# Patient Record
Sex: Female | Born: 1945 | Race: White | Hispanic: No | Smoking: Never smoker
Health system: Southern US, Community
[De-identification: ages and names within clinical notes are randomized; demographics above are authoritative.]

## PROBLEM LIST (undated history)

## (undated) DIAGNOSIS — Z9289 Personal history of other medical treatment: Secondary | ICD-10-CM

## (undated) DIAGNOSIS — F329 Major depressive disorder, single episode, unspecified: Secondary | ICD-10-CM

## (undated) DIAGNOSIS — F32A Depression, unspecified: Secondary | ICD-10-CM

## (undated) DIAGNOSIS — E119 Type 2 diabetes mellitus without complications: Secondary | ICD-10-CM

## (undated) DIAGNOSIS — G610 Guillain-Barre syndrome: Secondary | ICD-10-CM

## (undated) DIAGNOSIS — I509 Heart failure, unspecified: Secondary | ICD-10-CM

## (undated) DIAGNOSIS — M797 Fibromyalgia: Secondary | ICD-10-CM

## (undated) DIAGNOSIS — J449 Chronic obstructive pulmonary disease, unspecified: Secondary | ICD-10-CM

## (undated) DIAGNOSIS — M545 Low back pain, unspecified: Secondary | ICD-10-CM

## (undated) DIAGNOSIS — G43909 Migraine, unspecified, not intractable, without status migrainosus: Secondary | ICD-10-CM

## (undated) DIAGNOSIS — I639 Cerebral infarction, unspecified: Secondary | ICD-10-CM

## (undated) DIAGNOSIS — G4730 Sleep apnea, unspecified: Secondary | ICD-10-CM

## (undated) DIAGNOSIS — K219 Gastro-esophageal reflux disease without esophagitis: Secondary | ICD-10-CM

## (undated) DIAGNOSIS — F0390 Unspecified dementia without behavioral disturbance: Secondary | ICD-10-CM

## (undated) DIAGNOSIS — G8929 Other chronic pain: Secondary | ICD-10-CM

## (undated) DIAGNOSIS — J45909 Unspecified asthma, uncomplicated: Secondary | ICD-10-CM

## (undated) DIAGNOSIS — K259 Gastric ulcer, unspecified as acute or chronic, without hemorrhage or perforation: Secondary | ICD-10-CM

## (undated) DIAGNOSIS — Z8719 Personal history of other diseases of the digestive system: Secondary | ICD-10-CM

## (undated) DIAGNOSIS — I10 Essential (primary) hypertension: Secondary | ICD-10-CM

## (undated) DIAGNOSIS — M1990 Unspecified osteoarthritis, unspecified site: Secondary | ICD-10-CM

## (undated) DIAGNOSIS — E7800 Pure hypercholesterolemia, unspecified: Secondary | ICD-10-CM

## (undated) DIAGNOSIS — Z8711 Personal history of peptic ulcer disease: Secondary | ICD-10-CM

## (undated) DIAGNOSIS — F419 Anxiety disorder, unspecified: Secondary | ICD-10-CM

## (undated) DIAGNOSIS — D649 Anemia, unspecified: Secondary | ICD-10-CM

## (undated) DIAGNOSIS — J189 Pneumonia, unspecified organism: Secondary | ICD-10-CM

## (undated) DIAGNOSIS — K920 Hematemesis: Secondary | ICD-10-CM

## (undated) HISTORY — PX: APPENDECTOMY: SHX54

## (undated) HISTORY — PX: BREAST SURGERY: SHX581

## (undated) HISTORY — PX: CATARACT EXTRACTION, BILATERAL: SHX1313

## (undated) HISTORY — PX: CARPAL TUNNEL RELEASE: SHX101

## (undated) HISTORY — PX: CHOLECYSTECTOMY: SHX55

## (undated) HISTORY — PX: ABDOMINAL HERNIA REPAIR: SHX539

## (undated) HISTORY — PX: KNEE ARTHROSCOPY: SHX127

## (undated) HISTORY — PX: DILATION AND CURETTAGE OF UTERUS: SHX78

## (undated) HISTORY — PX: BUNIONECTOMY: SHX129

## (undated) HISTORY — PX: HIP FRACTURE SURGERY: SHX118

## (undated) HISTORY — PX: FRACTURE SURGERY: SHX138

## (undated) HISTORY — PX: TONSILLECTOMY: SUR1361

## (undated) HISTORY — PX: HERNIA REPAIR: SHX51

## (undated) HISTORY — PX: ELBOW FRACTURE SURGERY: SHX616

## (undated) HISTORY — PX: ABDOMINAL HYSTERECTOMY: SHX81

## (undated) HISTORY — PX: SHOULDER OPEN ROTATOR CUFF REPAIR: SHX2407

---

## 2000-03-24 ENCOUNTER — Inpatient Hospital Stay (HOSPITAL_COMMUNITY): Admission: EM | Admit: 2000-03-24 | Discharge: 2000-04-01 | Payer: Self-pay | Admitting: *Deleted

## 2007-11-11 ENCOUNTER — Encounter: Admission: RE | Admit: 2007-11-11 | Discharge: 2007-11-11 | Payer: Self-pay | Admitting: Orthopedic Surgery

## 2008-01-05 ENCOUNTER — Inpatient Hospital Stay (HOSPITAL_COMMUNITY): Admission: RE | Admit: 2008-01-05 | Discharge: 2008-01-11 | Payer: Self-pay | Admitting: Orthopedic Surgery

## 2010-05-27 ENCOUNTER — Encounter: Payer: Self-pay | Admitting: Orthopedic Surgery

## 2010-09-17 NOTE — Discharge Summary (Signed)
Melissa [name], Melissa [name]              ACCOUNT NO.:  [id number]   MEDICAL RECORD NO.:  [id number]          PATIENT TYPE:  INP   LOCATION:  NA                           FACILITY:  [facility]   PHYSICIAN:  Alexzandrew L. Perkins, P.A.C.DATE OF BIRTH:  06/10/1945   DATE OF ADMISSION:  01/05/2008  DATE OF DISCHARGE:  01/11/2008                               DISCHARGE SUMMARY   ADMITTING DIAGNOSES:  1. Avascular necrosis, left hip.  2. Hypertension.  3. Heart disease.  4. Asthma.  5. Chronic obstructive pulmonary disease.  6. Sleep apnea; uses CPAP.  7. Fibromyalgia.   DISCHARGE DIAGNOSES:  1. Avascular necrosis, left hip, status post left total hip      replacement arthroplasty.  2. Mild hyponatremia (noted to be hyponatremia preoperatively also).  3. Mild postoperative blood loss anemia; status post transfusion      without sequelae.  4. Hypertension.  5. Heart disease.  6. Asthma.  7. Chronic obstructive pulmonary disease.  8. Sleep apnea; uses CPAP.  9. Fibromyalgia.   PROCEDURE:  January 05, 2008 - left total hip.  Surgeon - Dr. Lequita Halt.  Assistant - Avel Peace PA-C.  Anesthesia - general.   CONSULTS:  None.   BRIEF HISTORY:  Melissa [name] is a 65 year old female with severe  osteonecrosis of the left hip with intractable pain, failed outpatient  management and now presents for a total hip arthroplasty.   LABORATORY DATA:  Preoperative CBC showed a hemoglobin of 12.1,  hematocrit 36.1, white cell count 7.9, platelets 445.  Chem panel on  admission did show the low sodium preoperatively at 129, low potassium  of 2.9, creatinine slightly elevated at 1.29.  Remaining Chem panel  within normal limits.  Preoperative UA revealed trace ketones; otherwise  negative.  PT/INR 13.4 and 1.05 with PTT of 25.  Serial CBCs were  followed.  Hemoglobin dropped down to 8.8.  She was given 2 units of  blood.  Post procedure hemoglobin back up to 10.3, then the last noted  hemoglobin and  hematocrit was 9.8 and 28.2.  Serial protimes followed.  Last noted PT/INR 25.5 with an INR of 2.2.  Serial BMETS were followed.  Sodium did drop down to 128; it got as low as 126, so only a few points  difference from her pre-admitting sodium.  Potassium remained within  normal limits.  Creatinine did start out a little high at 1.29 and came  back down to a normal of 0.85.   Chest x-ray preoperatively on December 30, 2007 - no active disease.  Left  hip films on January 05, 2008 - left total hip replaced without  evidence of complication.  Chest x-ray dated January 07, 2008 - minimal  atelectasis of the left lung base; moderate hiatal hernia.   EKG dated November 23, 2007 - sinus rhythm is unconfirmed; no ST and T  segment changes.   HOSPITAL COURSE:  The patient was admitted to [facility] and  tolerated the procedure well.  Later transferred to the recovery room  and orthopedic floor.  Started on PCA and analgesics for pain control  following  surgery.  The morning of day one doing a little bit better,  but still had a fair amount of pain.  She was started back on her home  medications.  She did have a drop in her hemoglobin down to 8.8.  We  discussed blood and went ahead and transfused her at that time.  She was  started back on her Allegra D and ProAir with a history of asthma and  COPD.  She was also given Symbicort, which she used at home.  She  tolerated the blood well, although she did have some cough by day two.  Hemoglobin was back up to 10.3.  She was noted to have an upper  respiratory infection by [place] preoperatively.  She still had some residual  cough that was exasperated and aggravated by surgery.  Sodium was down a  little bit, so we discontinued her fluids.  She started out low at 129.  She was down to 127 where it stabilized.  Dressing changed on day two;  incision looked good.  She was slowly progressing with her physical  therapy through the weekend.  Sodium  had dropped down to 126 but  stabilized there.  Chest x-ray which we took due to her cough did show  some mild atelectasis at the lung base, but no other acute changes.  She  was slowly progressing with her therapy and remained in house throughout  the weekend to receive continued PT.  Over the weekend, started  ambulating about 50 feet.  By postoperative day #4, cough was getting a  little bit better.  Incision was healing well.  She came up and was  therapeutic on her INR by that time at 0.1.  Still a persistent cough by  postoperative day #5, although she did see improve and was up walking  about 100 feet by postoperative day #5.  Continued to progress well, and  by the next day of January 11, 2008, she was doing well.  Still had a  little bit of soreness, but the pain was much better than  preoperatively, tolerating her medications.  She had been doing steps,  walking over 100 feet and was discharged home.   DISCHARGE/PLAN:  1. The patient was discharged home on January 11, 2008.  2. Discharge diagnoses - please see above.  3. Discharge medications - Percocet, Robaxin and Coumadin.  4. Diet - heart-healthy cardiac diet.  5. Follow up 2 weeks from surgery.  Call the office for an appointment      at [phone].  6. Activity - She is partial weightbearing 25-50% left lower      extremity.  Hip precautions.  Total hip protocol.  Home health PT,      home health nursing.  Do not submerge the incision under water, but      she may start showering.   DISPOSITION:  Home.   CONDITION ON DISCHARGE:  Improving.      Alexzandrew L. Perkins, P.A.C.     ALP/MEDQ  D:  01/11/2008  T:  01/11/2008  Job:  [id number]   cc:   Quentin Mulling, MD  Fax: [phone]

## 2010-09-17 NOTE — H&P (Signed)
[name], [name]              ACCOUNT NO.:  [id number]   MEDICAL RECORD NO.:  [id number]          PATIENT TYPE:  INP   LOCATION:  NA                           FACILITY:  [facility]   PHYSICIAN:  Melissa [name], M.D.    DATE OF BIRTH:  10-06-1945   DATE OF ADMISSION:  01/05/2008  DATE OF DISCHARGE:                              HISTORY & PHYSICAL   CHIEF COMPLAINT:  Left hip pain.   HISTORY OF PRESENT ILLNESS:  The patient is a 65 year old female who has  been seen by Dr. Lequita [name] for ongoing left hip pain.  It has been quite  severe now and getting worse, hurting all the time including at night.  She is seen in the office where x-rays show a small area femoral head  collapse and a fairly large lesion of osteonecrosis is found on the MRI.  Due to her left hip pain and osteonecrosis with early collapse. she is  having intense pain now, felt to be a candidate for surgery.  Risks and  benefits been discussed.  She elects to receive surgery.   ALLERGIES:  1. PENICILLIN.  2. METHADONE.  3. SULFA.  4. LYRICA.  5. AVELOX.   PAST MEDICAL HISTORY:  1. Hypertension.  2. Heart disease with only small blockages.  3. Asthma.  4. COPD.  5. Sleep apnea which she uses a CPAP.  6. Fibromyalgia.   PAST SURGERIES:  1. History of bilateral rotator cuff repair.  2. Bilateral carpal tunnel release.  3. Right knee surgery.  4. Left elbow surgery.  5. Right foot surgery.   CURRENT MEDICATIONS:  Vicodin, Xyzal, Arthrotec, pantoprazole, Zocor,  lisinopril, Soma, Detrol, nitroglycerin, estradiol, tramadol,  amlodipine, Phenergan, hydroxyzine, citalopram, Symbicort, ProAir and  Norco.   FAMILY HISTORY:  With heart disease, shows a sister with MI and CABG.  Another sister same, MI and CABG.   SOCIAL HISTORY:  Single, 3 boys.  Denies use tobacco products or alcohol  products.  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   REVIEW OF SYSTEMS:  GENERAL:  A little low grade temp for about 3 days.  No fevers, chills.  RESPIRATORY:  A little bit shortness breath on exertion.  No shortness  of breath at rest.  Little productive cough, clear.  CARDIOVASCULAR:  No chest pain, orthopnea.  GI:  A little bit of intermittent nausea, vomiting, diarrhea, no  constipation.  GU:  No dysuria, hematuria or discharge.  MUSCULOSKELETAL:  Left hip.   PHYSICAL EXAM:  A 65 year old female well-nourished, well-developed, in  no acute distress.  She is alert, oriented, cooperative.  HEENT:  Normocephalic, atraumatic.  Does not wear glasses.  EOMs intact.  NECK:  Supple.  CHEST:  Within normal limits with the exception of faint crackles at the  bases.  HEART:  Regular rate and rhythm.  No murmur, S1, S2 noted.  ABDOMEN:  Soft, nontender.  Bowel sounds present.  RECTAL/BREASTS/GENITALIA:  Not done, not pertinent to present illness.    EXTREMITIES:  Left hip flexion 100, internal rotation 20, external  rotation 30, abduction 30.   IMPRESSION:  Avascular necrosis left hip.   PLAN:  The patient admitted to [facility], undergo a left  total hip replacement arthroplasty.  Surgery will be performed by Dr.  Ollen [name].      Melissa [name], P.A.C.      Melissa [name], M.D.  Electronically Signed    ALP/MEDQ  D:  01/04/2008  T:  01/05/2008  Job:  [id number]   cc:   Melissa [name], M.D.  [address]  [place] [address]   Melissa [name], M.D.  Fax: [phone]   Melissa [name], P.A.C.

## 2010-09-17 NOTE — H&P (Signed)
NAMECALIE, BUTTREY              ACCOUNT NO.:  [id number]   MEDICAL RECORD NO.:  [id number]          PATIENT TYPE:  INP   LOCATION:  NA                           FACILITY:  [facility]   PHYSICIAN:  Ollen Gross, M.D.    DATE OF BIRTH:  02-Mar-1946   DATE OF ADMISSION:  01/05/2008  DATE OF DISCHARGE:                              HISTORY & PHYSICAL   CHIEF COMPLAINT:  Left hip pain.   HISTORY OF PRESENT ILLNESS:  The patient is a 65 year old female who has  been seen by Dr. Lequita Halt for ongoing left hip pain.  It has been quite  severe now and getting worse, hurting all the time including at night.  She is seen in the office where x-rays show a small area femoral head  collapse and a fairly large lesion of osteonecrosis is found on the MRI.  Due to her left hip pain and osteonecrosis with early collapse. she is  having intense pain now, felt to be a candidate for surgery.  Risks and  benefits been discussed.  She elects to receive surgery.   ALLERGIES:  1. PENICILLIN.  2. METHADONE.  3. SULFA.  4. LYRICA.  5. AVELOX.   PAST MEDICAL HISTORY:  1. Hypertension.  2. Heart disease with only small blockages.  3. Asthma.  4. COPD.  5. Sleep apnea which she uses a CPAP.  6. Fibromyalgia.   PAST SURGERIES:  1. History of bilateral rotator cuff repair.  2. Bilateral carpal tunnel release.  3. Right knee surgery.  4. Left elbow surgery.  5. Right foot surgery.   CURRENT MEDICATIONS:  Vicodin, Xyzal, Arthrotec, pantoprazole, Zocor,  lisinopril, Soma, Detrol, nitroglycerin, estradiol, tramadol,  amlodipine, Phenergan, hydroxyzine, citalopram, Symbicort, ProAir and  Norco.   FAMILY HISTORY:  With heart disease, shows a sister with MI and CABG.  Another sister same, MI and CABG.   SOCIAL HISTORY:  Single, 3 boys.  Denies use tobacco products or alcohol  products.  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   REVIEW OF SYSTEMS:  GENERAL:  A little low grade temp for about 3 days.  No fevers, chills.  RESPIRATORY:  A little bit shortness breath on exertion.  No shortness  of breath at rest.  Little productive cough, clear.  CARDIOVASCULAR:  No chest pain, orthopnea.  GI:  A little bit of intermittent nausea, vomiting, diarrhea, no  constipation.  GU:  No dysuria, hematuria or discharge.  MUSCULOSKELETAL:  Left hip.   PHYSICAL EXAM:  A 65 year old female well-nourished, well-developed, in  no acute distress.  She is alert, oriented, cooperative.  HEENT:  Normocephalic, atraumatic.  Does not wear glasses.  EOMs intact.  NECK:  Supple.  CHEST:  Within normal limits with the exception of faint crackles at the  bases.  HEART:  Regular rate and rhythm.  No murmur, S1, S2 noted.  ABDOMEN:  Soft, nontender.  Bowel sounds present.  RECTAL/BREASTS/GENITALIA:  Not done, not pertinent to present illness.    EXTREMITIES:  Left hip flexion 100, internal rotation 20, external  rotation 30, abduction 30.   IMPRESSION:  Avascular necrosis left hip.   PLAN:  The patient admitted to [facility], undergo a left  total hip replacement arthroplasty.  Surgery will be performed by Dr.  Ollen Gross.      Alexzandrew L. Perkins, P.A.C.      Ollen Gross, M.D.  Electronically Signed    ALP/MEDQ  D:  01/04/2008  T:  01/05/2008  Job:  [id number]   cc:   Lacretia Leigh. Quintella Reichert, M.D.  [place]  [place] [address]   Ollen Gross, M.D.  Fax: [phone]   Alexzandrew L. Julien Girt, P.A.C.

## 2010-09-17 NOTE — Op Note (Signed)
NAMEDARCELLA, SHIFFMAN              ACCOUNT NO.:  [id number]   MEDICAL RECORD NO.:  [id number]          PATIENT TYPE:  INP   LOCATION:  0011                         FACILITY:  [facility]   PHYSICIAN:  Ollen Gross, M.D.    DATE OF BIRTH:  13-Aug-1945   DATE OF PROCEDURE:  01/05/2008  DATE OF DISCHARGE:                               OPERATIVE REPORT   PREOPERATIVE DIAGNOSIS:  Avascular necrosis left hip.   POSTOPERATIVE DIAGNOSIS:  Avascular necrosis left hip.   PROCEDURE:  Left total hip arthroplasty.   SURGEON:  Ollen Gross, M.D.   ASSISTANT:  Alexzandrew L. Perkins, P.A.C.   ANESTHESIA:  General.   ESTIMATED BLOOD LOSS:  400.   DRAIN:  Hemovac times one.   COMPLICATIONS:  None.   CONDITION:  Stable to recovery room.   CLINICAL NOTE:  Ms. Leas is a 65 year old female who has severe  osteonecrosis of the left hip with intractable worsening pain.  She has  failed nonoperative management and presents now for total hip  arthroplasty.   PROCEDURE IN DETAIL:  After the successful administration of general  anesthetic the patient was placed in the right lateral decubitus  position with the left side up and held with the hip positioner.  The  left lower extremity was isolated from her perineum with plastic drapes  and prepped and draped in the usual sterile fashion.  Short  posterolateral incision was made with a 10 blade through subcutaneous  tissue to the level of the fascia lata which was incised in line with  the skin incision.  Sciatic nerve was palpated and protected and the  short external rotators isolated off the femur.  Capsulectomy was  performed and the hip was dislocated.  Center of the femoral head is  marked and a trial prosthesis was placed such that the center of the  trial head corresponds to the center of the native femoral head.  Osteotomy lines marked on the femoral neck and osteotomy made with an  oscillating saw.  Femoral head was removed and the femur  retracted  anteriorly to gain acetabular exposure.   Acetabular retractors were placed and labrum and osteophytes removed.  Acetabular reaming starts at 45 mm coursing increments of 2 to 51 mm and  then a 52 mm Pinnacle acetabular shell was placed in anatomic position  and transfixed with two dome screws.  The trial 32 mm neutral +4 liner  was placed.   The femur was prepared with the canal finder and irrigation.  She was  getting a [place] cemented stem.  We started broaching to size 1 up  coursing to a size 4.  We were able to sink the size 4 down to the level  of the calcar and it had good torsional fit.  We placed the size of 4/5  high offset neck with a 32 +1 head.  The hip reduced with great  stability.  She has full extension, full external rotation, 70 degrees  of flexion, 40 degrees of adduction and 90 degrees of internal rotation  and 90 degrees of flexion with 90 degrees of internal  rotation.  By  placing the left leg on top of the right the leg lengths were equal.  The hip was dislocated and all trials are removed.  The permanent apex  hole eliminator was placed to the acetabular shell and then the  permanent 32 mm neutral +4 liner was placed.  Sponge is placed at the  acetabulum and then we sized for the cement restrictor which was a size  3 in the femoral side.  We placed a size 3 restrictor at the appropriate  depth in the femoral canal and then thoroughly irrigated the canal with  pulsatile lavage while the cement was being mixed.  When the cement was  ready for implantation we injected it into the femoral canal and  pressurized it.  I used the size 5 high offset stem as this had the  better fill of the femoral canal.  It was impacted down to the calcar  level.  The stem was held in place while the cement hardens.  Once the  cement is fully hardened then we placed a 32 +1 head, reduced the hip  and the had the same stability parameters.  The wound was copiously   irrigated with saline solution and short rotators reattached to the  femur through drill holes.  The fascia was closed over Hemovac drain  with interrupted #1 Vicryl, subcu closed with #1 and 2-0 Vicryl and  subcuticular running 4-0 Monocryl.  The drain was hooked to suction.  Incision cleaned and dried and Steri-Strips and a bulky sterile dressing  applied.  Left lower extremity was placed into a knee immobilizer and  she was awakened and transported to recovery in stable condition.      Ollen Gross, M.D.  Electronically Signed     FA/MEDQ  D:  01/05/2008  T:  01/06/2008  Job:  [id number]

## 2010-09-20 NOTE — H&P (Signed)
[facility]  Patient:    Melissa [name], Melissa [name]                       MRN: [id number] Adm. Date:  [id number] Disc. Date: [id number] Attending:  Annamarie Dawley Dictator:   Candi Leash. Orsini, N.P.                   Psychiatric Admission Assessment  MENTAL STATUS EXAMINATION:  Appearance and behavior:  The patient is in the bed, very sleepy, opens her eyes momentarily.  She is too sleepy to answer questions.  She is dressed appropriately.  Her speech is soft and relevant. Her mood was depressed and irritable on admission.  Her thought processes on the day of admission having positive suicidal ideation with undisclosed plan, positive homicidal ideation the day of admission.  She denies any auditory or visual hallucinations, negative paranoia, negative flight of ideas. Cognitively, she is aware of date, age and circumstance.  Judgment is fair. Insight is fair.  DIAGNOSES; Axis I:    Depression, not otherwise specified. Axis II:   Deferred. Axis III:  1. Bronchitis.            2. Bursitis. Axis IV:   Moderate with problems relating to primary support group. Axis V:    Current global assessment of functioning is 35, highest past year            64.  PLAN: 1. Voluntary admission to [facility] for depression and suicidal    ideation.  Contract for safety.  Check every 15 minutes. 2. Will resume her routine medications. 3. The patient is a high fall risk. 4. Will decrease her dosage of Xanax as the patient is sedated this morning.  TENTATIVE LENGTH OF STAY:  Three to four days. DD:  03/24/00 TD:  03/24/00 Job: [id number]
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
## 2011-02-05 LAB — CBC
HCT: 28.4 — ABNORMAL LOW
HCT: 30.5 — ABNORMAL LOW
Hemoglobin: 10.3 — ABNORMAL LOW
Hemoglobin: 9.6 — ABNORMAL LOW
MCHC: 33.7
MCHC: 33.8
MCHC: 33.9
MCHC: 34.7
MCV: 95.1
Platelets: 280
Platelets: 294
RBC: 2.74 — ABNORMAL LOW
RDW: 12.5
RDW: 12.6
RDW: 12.9
RDW: 13

## 2011-02-05 LAB — DIFFERENTIAL
Basophils Absolute: 0
Eosinophils Relative: 5
Lymphocytes Relative: 15
Monocytes Absolute: 0.5

## 2011-02-05 LAB — BASIC METABOLIC PANEL
BUN: 15
BUN: 6
BUN: 8
BUN: 9
CO2: 28
CO2: 30
CO2: 31
CO2: 31
Calcium: 8.1 — ABNORMAL LOW
Calcium: 8.3 — ABNORMAL LOW
Calcium: 8.5
Chloride: 89 — ABNORMAL LOW
Chloride: 95 — ABNORMAL LOW
Creatinine, Ser: 0.85
Creatinine, Ser: 0.94
Creatinine, Ser: 1.29 — ABNORMAL HIGH
GFR calc Af Amer: 51 — ABNORMAL LOW
Glucose, Bld: 120 — ABNORMAL HIGH
Glucose, Bld: 124 — ABNORMAL HIGH
Glucose, Bld: 142 — ABNORMAL HIGH
Sodium: 127 — ABNORMAL LOW

## 2011-02-05 LAB — PROTIME-INR
INR: 2 — ABNORMAL HIGH
INR: 2.2 — ABNORMAL HIGH
Prothrombin Time: 14.6
Prothrombin Time: 25.6 — ABNORMAL HIGH

## 2011-02-05 LAB — TYPE AND SCREEN: ABO/RH(D): A POS

## 2011-03-03 HISTORY — PX: COLONOSCOPY: SHX174

## 2014-05-11 DIAGNOSIS — E782 Mixed hyperlipidemia: Secondary | ICD-10-CM | POA: Diagnosis not present

## 2014-05-11 DIAGNOSIS — J449 Chronic obstructive pulmonary disease, unspecified: Secondary | ICD-10-CM | POA: Diagnosis not present

## 2014-05-11 DIAGNOSIS — I10 Essential (primary) hypertension: Secondary | ICD-10-CM | POA: Diagnosis not present

## 2014-05-11 DIAGNOSIS — F411 Generalized anxiety disorder: Secondary | ICD-10-CM | POA: Diagnosis not present

## 2014-05-11 DIAGNOSIS — F0390 Unspecified dementia without behavioral disturbance: Secondary | ICD-10-CM | POA: Diagnosis not present

## 2014-05-12 DIAGNOSIS — G301 Alzheimer's disease with late onset: Secondary | ICD-10-CM | POA: Diagnosis not present

## 2014-05-12 DIAGNOSIS — N183 Chronic kidney disease, stage 3 (moderate): Secondary | ICD-10-CM | POA: Diagnosis not present

## 2014-05-12 DIAGNOSIS — J209 Acute bronchitis, unspecified: Secondary | ICD-10-CM | POA: Diagnosis not present

## 2014-05-12 DIAGNOSIS — R600 Localized edema: Secondary | ICD-10-CM | POA: Diagnosis not present

## 2014-05-12 DIAGNOSIS — I5032 Chronic diastolic (congestive) heart failure: Secondary | ICD-10-CM | POA: Diagnosis not present

## 2014-05-12 DIAGNOSIS — E782 Mixed hyperlipidemia: Secondary | ICD-10-CM | POA: Diagnosis not present

## 2014-05-12 DIAGNOSIS — J449 Chronic obstructive pulmonary disease, unspecified: Secondary | ICD-10-CM | POA: Diagnosis not present

## 2014-05-12 DIAGNOSIS — F5101 Primary insomnia: Secondary | ICD-10-CM | POA: Diagnosis not present

## 2014-05-15 DIAGNOSIS — I10 Essential (primary) hypertension: Secondary | ICD-10-CM | POA: Diagnosis not present

## 2014-05-15 DIAGNOSIS — E782 Mixed hyperlipidemia: Secondary | ICD-10-CM | POA: Diagnosis not present

## 2014-05-24 DIAGNOSIS — J449 Chronic obstructive pulmonary disease, unspecified: Secondary | ICD-10-CM | POA: Diagnosis not present

## 2014-05-24 DIAGNOSIS — F0390 Unspecified dementia without behavioral disturbance: Secondary | ICD-10-CM | POA: Diagnosis not present

## 2014-05-24 DIAGNOSIS — F411 Generalized anxiety disorder: Secondary | ICD-10-CM | POA: Diagnosis not present

## 2014-05-24 DIAGNOSIS — E782 Mixed hyperlipidemia: Secondary | ICD-10-CM | POA: Diagnosis not present

## 2014-05-24 DIAGNOSIS — I10 Essential (primary) hypertension: Secondary | ICD-10-CM | POA: Diagnosis not present

## 2014-06-01 DIAGNOSIS — N183 Chronic kidney disease, stage 3 (moderate): Secondary | ICD-10-CM | POA: Diagnosis not present

## 2014-06-01 DIAGNOSIS — E782 Mixed hyperlipidemia: Secondary | ICD-10-CM | POA: Diagnosis not present

## 2014-06-01 DIAGNOSIS — I6350 Cerebral infarction due to unspecified occlusion or stenosis of unspecified cerebral artery: Secondary | ICD-10-CM | POA: Diagnosis not present

## 2014-06-01 DIAGNOSIS — D649 Anemia, unspecified: Secondary | ICD-10-CM | POA: Diagnosis not present

## 2014-06-01 DIAGNOSIS — R600 Localized edema: Secondary | ICD-10-CM | POA: Diagnosis not present

## 2014-06-01 DIAGNOSIS — J449 Chronic obstructive pulmonary disease, unspecified: Secondary | ICD-10-CM | POA: Diagnosis not present

## 2014-06-01 DIAGNOSIS — F5101 Primary insomnia: Secondary | ICD-10-CM | POA: Diagnosis not present

## 2014-06-01 DIAGNOSIS — I5032 Chronic diastolic (congestive) heart failure: Secondary | ICD-10-CM | POA: Diagnosis not present

## 2014-06-09 DIAGNOSIS — I10 Essential (primary) hypertension: Secondary | ICD-10-CM | POA: Diagnosis not present

## 2014-06-09 DIAGNOSIS — E782 Mixed hyperlipidemia: Secondary | ICD-10-CM | POA: Diagnosis not present

## 2014-06-09 DIAGNOSIS — F411 Generalized anxiety disorder: Secondary | ICD-10-CM | POA: Diagnosis not present

## 2014-06-09 DIAGNOSIS — J449 Chronic obstructive pulmonary disease, unspecified: Secondary | ICD-10-CM | POA: Diagnosis not present

## 2014-06-09 DIAGNOSIS — F0390 Unspecified dementia without behavioral disturbance: Secondary | ICD-10-CM | POA: Diagnosis not present

## 2014-06-23 DIAGNOSIS — J449 Chronic obstructive pulmonary disease, unspecified: Secondary | ICD-10-CM | POA: Diagnosis not present

## 2014-06-23 DIAGNOSIS — F0390 Unspecified dementia without behavioral disturbance: Secondary | ICD-10-CM | POA: Diagnosis not present

## 2014-06-23 DIAGNOSIS — I10 Essential (primary) hypertension: Secondary | ICD-10-CM | POA: Diagnosis not present

## 2014-06-23 DIAGNOSIS — E782 Mixed hyperlipidemia: Secondary | ICD-10-CM | POA: Diagnosis not present

## 2014-06-23 DIAGNOSIS — F411 Generalized anxiety disorder: Secondary | ICD-10-CM | POA: Diagnosis not present

## 2014-06-28 DIAGNOSIS — I10 Essential (primary) hypertension: Secondary | ICD-10-CM | POA: Diagnosis not present

## 2014-06-28 DIAGNOSIS — E782 Mixed hyperlipidemia: Secondary | ICD-10-CM | POA: Diagnosis not present

## 2014-06-29 DIAGNOSIS — E782 Mixed hyperlipidemia: Secondary | ICD-10-CM | POA: Diagnosis not present

## 2014-06-29 DIAGNOSIS — N183 Chronic kidney disease, stage 3 (moderate): Secondary | ICD-10-CM | POA: Diagnosis not present

## 2014-06-29 DIAGNOSIS — J209 Acute bronchitis, unspecified: Secondary | ICD-10-CM | POA: Diagnosis not present

## 2014-06-29 DIAGNOSIS — I6350 Cerebral infarction due to unspecified occlusion or stenosis of unspecified cerebral artery: Secondary | ICD-10-CM | POA: Diagnosis not present

## 2014-06-29 DIAGNOSIS — J449 Chronic obstructive pulmonary disease, unspecified: Secondary | ICD-10-CM | POA: Diagnosis not present

## 2014-06-29 DIAGNOSIS — F5101 Primary insomnia: Secondary | ICD-10-CM | POA: Diagnosis not present

## 2014-06-29 DIAGNOSIS — R600 Localized edema: Secondary | ICD-10-CM | POA: Diagnosis not present

## 2014-06-29 DIAGNOSIS — I5032 Chronic diastolic (congestive) heart failure: Secondary | ICD-10-CM | POA: Diagnosis not present

## 2014-07-07 DIAGNOSIS — I10 Essential (primary) hypertension: Secondary | ICD-10-CM | POA: Diagnosis not present

## 2014-07-07 DIAGNOSIS — F0390 Unspecified dementia without behavioral disturbance: Secondary | ICD-10-CM | POA: Diagnosis not present

## 2014-07-07 DIAGNOSIS — E782 Mixed hyperlipidemia: Secondary | ICD-10-CM | POA: Diagnosis not present

## 2014-07-07 DIAGNOSIS — J449 Chronic obstructive pulmonary disease, unspecified: Secondary | ICD-10-CM | POA: Diagnosis not present

## 2014-07-07 DIAGNOSIS — F411 Generalized anxiety disorder: Secondary | ICD-10-CM | POA: Diagnosis not present

## 2014-07-21 DIAGNOSIS — E782 Mixed hyperlipidemia: Secondary | ICD-10-CM | POA: Diagnosis not present

## 2014-07-21 DIAGNOSIS — I10 Essential (primary) hypertension: Secondary | ICD-10-CM | POA: Diagnosis not present

## 2014-07-21 DIAGNOSIS — J449 Chronic obstructive pulmonary disease, unspecified: Secondary | ICD-10-CM | POA: Diagnosis not present

## 2014-07-21 DIAGNOSIS — F0390 Unspecified dementia without behavioral disturbance: Secondary | ICD-10-CM | POA: Diagnosis not present

## 2014-07-21 DIAGNOSIS — F411 Generalized anxiety disorder: Secondary | ICD-10-CM | POA: Diagnosis not present

## 2014-07-27 DIAGNOSIS — I6350 Cerebral infarction due to unspecified occlusion or stenosis of unspecified cerebral artery: Secondary | ICD-10-CM | POA: Diagnosis not present

## 2014-07-27 DIAGNOSIS — R600 Localized edema: Secondary | ICD-10-CM | POA: Diagnosis not present

## 2014-07-27 DIAGNOSIS — E782 Mixed hyperlipidemia: Secondary | ICD-10-CM | POA: Diagnosis not present

## 2014-07-27 DIAGNOSIS — F5101 Primary insomnia: Secondary | ICD-10-CM | POA: Diagnosis not present

## 2014-07-27 DIAGNOSIS — J3089 Other allergic rhinitis: Secondary | ICD-10-CM | POA: Diagnosis not present

## 2014-07-27 DIAGNOSIS — I5032 Chronic diastolic (congestive) heart failure: Secondary | ICD-10-CM | POA: Diagnosis not present

## 2014-07-27 DIAGNOSIS — F411 Generalized anxiety disorder: Secondary | ICD-10-CM | POA: Diagnosis not present

## 2014-07-27 DIAGNOSIS — N183 Chronic kidney disease, stage 3 (moderate): Secondary | ICD-10-CM | POA: Diagnosis not present

## 2014-08-03 DIAGNOSIS — F0390 Unspecified dementia without behavioral disturbance: Secondary | ICD-10-CM | POA: Diagnosis not present

## 2014-08-03 DIAGNOSIS — I10 Essential (primary) hypertension: Secondary | ICD-10-CM | POA: Diagnosis not present

## 2014-08-03 DIAGNOSIS — E782 Mixed hyperlipidemia: Secondary | ICD-10-CM | POA: Diagnosis not present

## 2014-08-03 DIAGNOSIS — J449 Chronic obstructive pulmonary disease, unspecified: Secondary | ICD-10-CM | POA: Diagnosis not present

## 2014-08-03 DIAGNOSIS — F411 Generalized anxiety disorder: Secondary | ICD-10-CM | POA: Diagnosis not present

## 2014-08-04 DIAGNOSIS — J449 Chronic obstructive pulmonary disease, unspecified: Secondary | ICD-10-CM | POA: Diagnosis not present

## 2014-08-04 DIAGNOSIS — I5032 Chronic diastolic (congestive) heart failure: Secondary | ICD-10-CM | POA: Diagnosis not present

## 2014-08-04 DIAGNOSIS — E782 Mixed hyperlipidemia: Secondary | ICD-10-CM | POA: Diagnosis not present

## 2014-08-04 DIAGNOSIS — N183 Chronic kidney disease, stage 3 (moderate): Secondary | ICD-10-CM | POA: Diagnosis not present

## 2014-08-04 DIAGNOSIS — N390 Urinary tract infection, site not specified: Secondary | ICD-10-CM | POA: Diagnosis not present

## 2014-08-07 DIAGNOSIS — E782 Mixed hyperlipidemia: Secondary | ICD-10-CM | POA: Diagnosis not present

## 2014-08-07 DIAGNOSIS — I5032 Chronic diastolic (congestive) heart failure: Secondary | ICD-10-CM | POA: Diagnosis not present

## 2014-08-07 DIAGNOSIS — J449 Chronic obstructive pulmonary disease, unspecified: Secondary | ICD-10-CM | POA: Diagnosis not present

## 2014-08-07 DIAGNOSIS — N183 Chronic kidney disease, stage 3 (moderate): Secondary | ICD-10-CM | POA: Diagnosis not present

## 2014-08-07 DIAGNOSIS — M25552 Pain in left hip: Secondary | ICD-10-CM | POA: Diagnosis not present

## 2014-08-08 DIAGNOSIS — F0390 Unspecified dementia without behavioral disturbance: Secondary | ICD-10-CM | POA: Diagnosis not present

## 2014-08-08 DIAGNOSIS — F411 Generalized anxiety disorder: Secondary | ICD-10-CM | POA: Diagnosis not present

## 2014-08-08 DIAGNOSIS — E782 Mixed hyperlipidemia: Secondary | ICD-10-CM | POA: Diagnosis not present

## 2014-08-08 DIAGNOSIS — I10 Essential (primary) hypertension: Secondary | ICD-10-CM | POA: Diagnosis not present

## 2014-08-08 DIAGNOSIS — J449 Chronic obstructive pulmonary disease, unspecified: Secondary | ICD-10-CM | POA: Diagnosis not present

## 2014-08-22 DIAGNOSIS — R079 Chest pain, unspecified: Secondary | ICD-10-CM | POA: Diagnosis not present

## 2014-08-22 DIAGNOSIS — E782 Mixed hyperlipidemia: Secondary | ICD-10-CM | POA: Diagnosis not present

## 2014-08-22 DIAGNOSIS — I5032 Chronic diastolic (congestive) heart failure: Secondary | ICD-10-CM | POA: Diagnosis not present

## 2014-08-22 DIAGNOSIS — J449 Chronic obstructive pulmonary disease, unspecified: Secondary | ICD-10-CM | POA: Diagnosis not present

## 2014-08-22 DIAGNOSIS — N183 Chronic kidney disease, stage 3 (moderate): Secondary | ICD-10-CM | POA: Diagnosis not present

## 2014-08-23 DIAGNOSIS — I10 Essential (primary) hypertension: Secondary | ICD-10-CM | POA: Diagnosis not present

## 2014-08-23 DIAGNOSIS — F411 Generalized anxiety disorder: Secondary | ICD-10-CM | POA: Diagnosis not present

## 2014-08-23 DIAGNOSIS — E782 Mixed hyperlipidemia: Secondary | ICD-10-CM | POA: Diagnosis not present

## 2014-08-23 DIAGNOSIS — J449 Chronic obstructive pulmonary disease, unspecified: Secondary | ICD-10-CM | POA: Diagnosis not present

## 2014-08-23 DIAGNOSIS — F0390 Unspecified dementia without behavioral disturbance: Secondary | ICD-10-CM | POA: Diagnosis not present

## 2014-08-24 DIAGNOSIS — J449 Chronic obstructive pulmonary disease, unspecified: Secondary | ICD-10-CM | POA: Diagnosis not present

## 2014-08-24 DIAGNOSIS — N183 Chronic kidney disease, stage 3 (moderate): Secondary | ICD-10-CM | POA: Diagnosis not present

## 2014-08-24 DIAGNOSIS — R5383 Other fatigue: Secondary | ICD-10-CM | POA: Diagnosis not present

## 2014-08-24 DIAGNOSIS — E782 Mixed hyperlipidemia: Secondary | ICD-10-CM | POA: Diagnosis not present

## 2014-08-24 DIAGNOSIS — I5032 Chronic diastolic (congestive) heart failure: Secondary | ICD-10-CM | POA: Diagnosis not present

## 2014-08-24 DIAGNOSIS — F5101 Primary insomnia: Secondary | ICD-10-CM | POA: Diagnosis not present

## 2014-08-28 DIAGNOSIS — I209 Angina pectoris, unspecified: Secondary | ICD-10-CM | POA: Diagnosis not present

## 2014-08-28 DIAGNOSIS — E784 Other hyperlipidemia: Secondary | ICD-10-CM | POA: Diagnosis not present

## 2014-08-28 DIAGNOSIS — E785 Hyperlipidemia, unspecified: Secondary | ICD-10-CM | POA: Diagnosis not present

## 2014-08-28 DIAGNOSIS — R784 Finding of other drugs of addictive potential in blood: Secondary | ICD-10-CM | POA: Diagnosis not present

## 2014-08-28 DIAGNOSIS — I10 Essential (primary) hypertension: Secondary | ICD-10-CM | POA: Diagnosis not present

## 2014-08-28 DIAGNOSIS — I208 Other forms of angina pectoris: Secondary | ICD-10-CM | POA: Diagnosis not present

## 2014-09-01 DIAGNOSIS — I5032 Chronic diastolic (congestive) heart failure: Secondary | ICD-10-CM | POA: Diagnosis not present

## 2014-09-01 DIAGNOSIS — D649 Anemia, unspecified: Secondary | ICD-10-CM | POA: Diagnosis not present

## 2014-09-01 DIAGNOSIS — Z9181 History of falling: Secondary | ICD-10-CM | POA: Diagnosis not present

## 2014-09-01 DIAGNOSIS — J449 Chronic obstructive pulmonary disease, unspecified: Secondary | ICD-10-CM | POA: Diagnosis not present

## 2014-09-01 DIAGNOSIS — E782 Mixed hyperlipidemia: Secondary | ICD-10-CM | POA: Diagnosis not present

## 2014-09-01 DIAGNOSIS — Z7689 Persons encountering health services in other specified circumstances: Secondary | ICD-10-CM | POA: Diagnosis not present

## 2014-09-06 DIAGNOSIS — R0789 Other chest pain: Secondary | ICD-10-CM | POA: Diagnosis not present

## 2014-09-06 DIAGNOSIS — I6529 Occlusion and stenosis of unspecified carotid artery: Secondary | ICD-10-CM | POA: Diagnosis not present

## 2014-09-06 DIAGNOSIS — I313 Pericardial effusion (noninflammatory): Secondary | ICD-10-CM | POA: Diagnosis not present

## 2014-09-06 DIAGNOSIS — R0602 Shortness of breath: Secondary | ICD-10-CM | POA: Diagnosis not present

## 2014-09-11 DIAGNOSIS — I10 Essential (primary) hypertension: Secondary | ICD-10-CM | POA: Diagnosis not present

## 2014-09-11 DIAGNOSIS — E782 Mixed hyperlipidemia: Secondary | ICD-10-CM | POA: Diagnosis not present

## 2014-09-19 DIAGNOSIS — J449 Chronic obstructive pulmonary disease, unspecified: Secondary | ICD-10-CM | POA: Diagnosis not present

## 2014-09-19 DIAGNOSIS — R079 Chest pain, unspecified: Secondary | ICD-10-CM | POA: Diagnosis not present

## 2014-09-19 DIAGNOSIS — E669 Obesity, unspecified: Secondary | ICD-10-CM | POA: Diagnosis not present

## 2014-09-19 DIAGNOSIS — Z8673 Personal history of transient ischemic attack (TIA), and cerebral infarction without residual deficits: Secondary | ICD-10-CM | POA: Diagnosis not present

## 2014-09-19 DIAGNOSIS — I25118 Atherosclerotic heart disease of native coronary artery with other forms of angina pectoris: Secondary | ICD-10-CM | POA: Diagnosis not present

## 2014-09-19 DIAGNOSIS — J45909 Unspecified asthma, uncomplicated: Secondary | ICD-10-CM | POA: Diagnosis not present

## 2014-09-19 DIAGNOSIS — I25119 Atherosclerotic heart disease of native coronary artery with unspecified angina pectoris: Secondary | ICD-10-CM | POA: Diagnosis not present

## 2014-09-19 DIAGNOSIS — E785 Hyperlipidemia, unspecified: Secondary | ICD-10-CM | POA: Diagnosis not present

## 2014-09-19 DIAGNOSIS — N289 Disorder of kidney and ureter, unspecified: Secondary | ICD-10-CM | POA: Diagnosis not present

## 2014-09-19 DIAGNOSIS — Z7951 Long term (current) use of inhaled steroids: Secondary | ICD-10-CM | POA: Diagnosis not present

## 2014-09-19 DIAGNOSIS — Z6829 Body mass index (BMI) 29.0-29.9, adult: Secondary | ICD-10-CM | POA: Diagnosis not present

## 2014-09-19 DIAGNOSIS — I679 Cerebrovascular disease, unspecified: Secondary | ICD-10-CM | POA: Diagnosis not present

## 2014-09-19 DIAGNOSIS — E780 Pure hypercholesterolemia: Secondary | ICD-10-CM | POA: Diagnosis not present

## 2014-09-19 DIAGNOSIS — I10 Essential (primary) hypertension: Secondary | ICD-10-CM | POA: Diagnosis not present

## 2014-09-19 DIAGNOSIS — Z7982 Long term (current) use of aspirin: Secondary | ICD-10-CM | POA: Diagnosis not present

## 2014-09-19 DIAGNOSIS — Z79899 Other long term (current) drug therapy: Secondary | ICD-10-CM | POA: Diagnosis not present

## 2014-09-19 DIAGNOSIS — Z8249 Family history of ischemic heart disease and other diseases of the circulatory system: Secondary | ICD-10-CM | POA: Diagnosis not present

## 2014-09-26 DIAGNOSIS — J449 Chronic obstructive pulmonary disease, unspecified: Secondary | ICD-10-CM | POA: Diagnosis not present

## 2014-09-26 DIAGNOSIS — I5032 Chronic diastolic (congestive) heart failure: Secondary | ICD-10-CM | POA: Diagnosis not present

## 2014-09-26 DIAGNOSIS — D509 Iron deficiency anemia, unspecified: Secondary | ICD-10-CM | POA: Diagnosis not present

## 2014-09-26 DIAGNOSIS — E782 Mixed hyperlipidemia: Secondary | ICD-10-CM | POA: Diagnosis not present

## 2014-09-26 DIAGNOSIS — Z79899 Other long term (current) drug therapy: Secondary | ICD-10-CM | POA: Diagnosis not present

## 2014-10-04 DIAGNOSIS — H43311 Vitreous membranes and strands, right eye: Secondary | ICD-10-CM | POA: Diagnosis not present

## 2014-10-04 DIAGNOSIS — H524 Presbyopia: Secondary | ICD-10-CM | POA: Diagnosis not present

## 2014-10-04 DIAGNOSIS — H52223 Regular astigmatism, bilateral: Secondary | ICD-10-CM | POA: Diagnosis not present

## 2014-10-05 DIAGNOSIS — I2510 Atherosclerotic heart disease of native coronary artery without angina pectoris: Secondary | ICD-10-CM | POA: Diagnosis not present

## 2014-10-05 DIAGNOSIS — E782 Mixed hyperlipidemia: Secondary | ICD-10-CM | POA: Diagnosis not present

## 2014-10-05 DIAGNOSIS — I10 Essential (primary) hypertension: Secondary | ICD-10-CM | POA: Diagnosis not present

## 2014-10-25 DIAGNOSIS — G4700 Insomnia, unspecified: Secondary | ICD-10-CM | POA: Diagnosis not present

## 2014-10-25 DIAGNOSIS — M129 Arthropathy, unspecified: Secondary | ICD-10-CM | POA: Diagnosis not present

## 2014-10-25 DIAGNOSIS — I10 Essential (primary) hypertension: Secondary | ICD-10-CM | POA: Diagnosis not present

## 2014-10-25 DIAGNOSIS — G894 Chronic pain syndrome: Secondary | ICD-10-CM | POA: Diagnosis not present

## 2016-03-19 DIAGNOSIS — F329 Major depressive disorder, single episode, unspecified: Secondary | ICD-10-CM | POA: Diagnosis present

## 2016-03-19 DIAGNOSIS — F32A Depression, unspecified: Secondary | ICD-10-CM | POA: Diagnosis present

## 2016-03-19 DIAGNOSIS — G4700 Insomnia, unspecified: Secondary | ICD-10-CM | POA: Diagnosis present

## 2016-07-22 DIAGNOSIS — F411 Generalized anxiety disorder: Secondary | ICD-10-CM | POA: Diagnosis present

## 2016-08-26 DIAGNOSIS — K219 Gastro-esophageal reflux disease without esophagitis: Secondary | ICD-10-CM | POA: Diagnosis present

## 2016-09-12 DIAGNOSIS — I519 Heart disease, unspecified: Secondary | ICD-10-CM | POA: Diagnosis not present

## 2016-09-12 DIAGNOSIS — I10 Essential (primary) hypertension: Secondary | ICD-10-CM | POA: Diagnosis not present

## 2016-09-12 DIAGNOSIS — E871 Hypo-osmolality and hyponatremia: Secondary | ICD-10-CM

## 2016-09-12 DIAGNOSIS — K219 Gastro-esophageal reflux disease without esophagitis: Secondary | ICD-10-CM | POA: Diagnosis not present

## 2016-09-12 DIAGNOSIS — E785 Hyperlipidemia, unspecified: Secondary | ICD-10-CM | POA: Diagnosis not present

## 2016-09-12 DIAGNOSIS — F0390 Unspecified dementia without behavioral disturbance: Secondary | ICD-10-CM | POA: Diagnosis not present

## 2016-09-12 DIAGNOSIS — K449 Diaphragmatic hernia without obstruction or gangrene: Secondary | ICD-10-CM

## 2016-09-14 DIAGNOSIS — F0390 Unspecified dementia without behavioral disturbance: Secondary | ICD-10-CM | POA: Diagnosis not present

## 2016-09-14 DIAGNOSIS — E871 Hypo-osmolality and hyponatremia: Secondary | ICD-10-CM | POA: Diagnosis not present

## 2016-09-14 DIAGNOSIS — K449 Diaphragmatic hernia without obstruction or gangrene: Secondary | ICD-10-CM | POA: Diagnosis not present

## 2016-09-14 DIAGNOSIS — K219 Gastro-esophageal reflux disease without esophagitis: Secondary | ICD-10-CM | POA: Diagnosis not present

## 2016-09-15 DIAGNOSIS — K449 Diaphragmatic hernia without obstruction or gangrene: Secondary | ICD-10-CM | POA: Diagnosis not present

## 2016-09-15 DIAGNOSIS — K219 Gastro-esophageal reflux disease without esophagitis: Secondary | ICD-10-CM | POA: Diagnosis not present

## 2016-09-15 DIAGNOSIS — E871 Hypo-osmolality and hyponatremia: Secondary | ICD-10-CM | POA: Diagnosis not present

## 2016-09-15 DIAGNOSIS — F0390 Unspecified dementia without behavioral disturbance: Secondary | ICD-10-CM | POA: Diagnosis not present

## 2016-10-02 DIAGNOSIS — G8929 Other chronic pain: Secondary | ICD-10-CM | POA: Diagnosis present

## 2016-10-02 DIAGNOSIS — F0390 Unspecified dementia without behavioral disturbance: Secondary | ICD-10-CM

## 2016-10-02 DIAGNOSIS — M545 Low back pain: Secondary | ICD-10-CM

## 2016-10-02 DIAGNOSIS — I5033 Acute on chronic diastolic (congestive) heart failure: Secondary | ICD-10-CM

## 2016-10-02 DIAGNOSIS — I10 Essential (primary) hypertension: Secondary | ICD-10-CM

## 2016-10-03 DIAGNOSIS — F0390 Unspecified dementia without behavioral disturbance: Secondary | ICD-10-CM | POA: Diagnosis not present

## 2016-10-03 DIAGNOSIS — I5033 Acute on chronic diastolic (congestive) heart failure: Secondary | ICD-10-CM | POA: Diagnosis not present

## 2016-10-03 DIAGNOSIS — I10 Essential (primary) hypertension: Secondary | ICD-10-CM | POA: Diagnosis not present

## 2016-10-04 DIAGNOSIS — F0390 Unspecified dementia without behavioral disturbance: Secondary | ICD-10-CM | POA: Diagnosis not present

## 2016-10-04 DIAGNOSIS — I10 Essential (primary) hypertension: Secondary | ICD-10-CM | POA: Diagnosis not present

## 2016-10-04 DIAGNOSIS — I5033 Acute on chronic diastolic (congestive) heart failure: Secondary | ICD-10-CM | POA: Diagnosis not present

## 2016-10-08 DIAGNOSIS — J449 Chronic obstructive pulmonary disease, unspecified: Secondary | ICD-10-CM | POA: Diagnosis not present

## 2016-10-08 DIAGNOSIS — E860 Dehydration: Secondary | ICD-10-CM

## 2016-10-08 DIAGNOSIS — R55 Syncope and collapse: Secondary | ICD-10-CM | POA: Diagnosis not present

## 2016-10-08 DIAGNOSIS — E876 Hypokalemia: Secondary | ICD-10-CM

## 2016-10-08 DIAGNOSIS — I951 Orthostatic hypotension: Secondary | ICD-10-CM

## 2016-10-08 DIAGNOSIS — N179 Acute kidney failure, unspecified: Secondary | ICD-10-CM | POA: Diagnosis not present

## 2016-10-08 DIAGNOSIS — Z79899 Other long term (current) drug therapy: Secondary | ICD-10-CM | POA: Diagnosis not present

## 2016-10-08 DIAGNOSIS — E875 Hyperkalemia: Secondary | ICD-10-CM | POA: Diagnosis not present

## 2016-10-09 DIAGNOSIS — E876 Hypokalemia: Secondary | ICD-10-CM | POA: Diagnosis not present

## 2016-10-09 DIAGNOSIS — R55 Syncope and collapse: Secondary | ICD-10-CM | POA: Diagnosis not present

## 2016-10-09 DIAGNOSIS — E860 Dehydration: Secondary | ICD-10-CM | POA: Diagnosis not present

## 2016-10-09 DIAGNOSIS — I951 Orthostatic hypotension: Secondary | ICD-10-CM | POA: Diagnosis not present

## 2017-12-07 ENCOUNTER — Inpatient Hospital Stay (HOSPITAL_COMMUNITY): Payer: Medicare Other

## 2017-12-07 ENCOUNTER — Inpatient Hospital Stay (HOSPITAL_COMMUNITY)
Admission: EM | Admit: 2017-12-07 | Discharge: 2017-12-09 | DRG: 369 | Disposition: A | Discharge status: Home / Self Care | Payer: Medicare Other | Source: Other Acute Inpatient Hospital | Attending: Family Medicine | Admitting: Family Medicine

## 2017-12-07 ENCOUNTER — Encounter (HOSPITAL_COMMUNITY): Payer: Self-pay | Admitting: Emergency Medicine

## 2017-12-07 ENCOUNTER — Emergency Department (HOSPITAL_COMMUNITY): Payer: Medicare Other

## 2017-12-07 DIAGNOSIS — I313 Pericardial effusion (noninflammatory): Secondary | ICD-10-CM | POA: Diagnosis present

## 2017-12-07 DIAGNOSIS — I130 Hypertensive heart and chronic kidney disease with heart failure and stage 1 through stage 4 chronic kidney disease, or unspecified chronic kidney disease: Secondary | ICD-10-CM | POA: Diagnosis present

## 2017-12-07 DIAGNOSIS — M549 Dorsalgia, unspecified: Secondary | ICD-10-CM | POA: Diagnosis present

## 2017-12-07 DIAGNOSIS — K226 Gastro-esophageal laceration-hemorrhage syndrome: Principal | ICD-10-CM | POA: Diagnosis present

## 2017-12-07 DIAGNOSIS — N179 Acute kidney failure, unspecified: Secondary | ICD-10-CM | POA: Diagnosis present

## 2017-12-07 DIAGNOSIS — F329 Major depressive disorder, single episode, unspecified: Secondary | ICD-10-CM | POA: Diagnosis present

## 2017-12-07 DIAGNOSIS — I340 Nonrheumatic mitral (valve) insufficiency: Secondary | ICD-10-CM

## 2017-12-07 DIAGNOSIS — I4891 Unspecified atrial fibrillation: Secondary | ICD-10-CM | POA: Diagnosis present

## 2017-12-07 DIAGNOSIS — F0390 Unspecified dementia without behavioral disturbance: Secondary | ICD-10-CM | POA: Diagnosis present

## 2017-12-07 DIAGNOSIS — K259 Gastric ulcer, unspecified as acute or chronic, without hemorrhage or perforation: Secondary | ICD-10-CM | POA: Diagnosis not present

## 2017-12-07 DIAGNOSIS — D649 Anemia, unspecified: Secondary | ICD-10-CM | POA: Diagnosis not present

## 2017-12-07 DIAGNOSIS — M545 Low back pain: Secondary | ICD-10-CM | POA: Diagnosis not present

## 2017-12-07 DIAGNOSIS — F411 Generalized anxiety disorder: Secondary | ICD-10-CM | POA: Diagnosis not present

## 2017-12-07 DIAGNOSIS — R17 Unspecified jaundice: Secondary | ICD-10-CM | POA: Diagnosis present

## 2017-12-07 DIAGNOSIS — K222 Esophageal obstruction: Secondary | ICD-10-CM | POA: Diagnosis not present

## 2017-12-07 DIAGNOSIS — E782 Mixed hyperlipidemia: Secondary | ICD-10-CM | POA: Diagnosis present

## 2017-12-07 DIAGNOSIS — K921 Melena: Secondary | ICD-10-CM | POA: Diagnosis not present

## 2017-12-07 DIAGNOSIS — F0280 Dementia in other diseases classified elsewhere without behavioral disturbance: Secondary | ICD-10-CM | POA: Diagnosis not present

## 2017-12-07 DIAGNOSIS — F32A Depression, unspecified: Secondary | ICD-10-CM | POA: Diagnosis present

## 2017-12-07 DIAGNOSIS — G8929 Other chronic pain: Secondary | ICD-10-CM | POA: Diagnosis present

## 2017-12-07 DIAGNOSIS — J449 Chronic obstructive pulmonary disease, unspecified: Secondary | ICD-10-CM | POA: Diagnosis present

## 2017-12-07 DIAGNOSIS — K920 Hematemesis: Secondary | ICD-10-CM | POA: Diagnosis not present

## 2017-12-07 DIAGNOSIS — D62 Acute posthemorrhagic anemia: Secondary | ICD-10-CM | POA: Diagnosis not present

## 2017-12-07 DIAGNOSIS — I509 Heart failure, unspecified: Secondary | ICD-10-CM

## 2017-12-07 DIAGNOSIS — Z8673 Personal history of transient ischemic attack (TIA), and cerebral infarction without residual deficits: Secondary | ICD-10-CM | POA: Diagnosis not present

## 2017-12-07 DIAGNOSIS — I5042 Chronic combined systolic (congestive) and diastolic (congestive) heart failure: Secondary | ICD-10-CM | POA: Diagnosis present

## 2017-12-07 DIAGNOSIS — K922 Gastrointestinal hemorrhage, unspecified: Secondary | ICD-10-CM | POA: Diagnosis present

## 2017-12-07 DIAGNOSIS — K449 Diaphragmatic hernia without obstruction or gangrene: Secondary | ICD-10-CM | POA: Diagnosis not present

## 2017-12-07 DIAGNOSIS — I2510 Atherosclerotic heart disease of native coronary artery without angina pectoris: Secondary | ICD-10-CM | POA: Diagnosis present

## 2017-12-07 DIAGNOSIS — N183 Chronic kidney disease, stage 3 unspecified: Secondary | ICD-10-CM | POA: Diagnosis present

## 2017-12-07 DIAGNOSIS — E1122 Type 2 diabetes mellitus with diabetic chronic kidney disease: Secondary | ICD-10-CM | POA: Diagnosis present

## 2017-12-07 DIAGNOSIS — K219 Gastro-esophageal reflux disease without esophagitis: Secondary | ICD-10-CM | POA: Diagnosis not present

## 2017-12-07 DIAGNOSIS — Z8639 Personal history of other endocrine, nutritional and metabolic disease: Secondary | ICD-10-CM

## 2017-12-07 DIAGNOSIS — G4700 Insomnia, unspecified: Secondary | ICD-10-CM | POA: Diagnosis present

## 2017-12-07 DIAGNOSIS — I482 Chronic atrial fibrillation: Secondary | ICD-10-CM | POA: Diagnosis not present

## 2017-12-07 HISTORY — DX: Migraine, unspecified, not intractable, without status migrainosus: G43.909

## 2017-12-07 HISTORY — DX: Anemia, unspecified: D64.9

## 2017-12-07 HISTORY — DX: Personal history of peptic ulcer disease: Z87.11

## 2017-12-07 HISTORY — DX: Anxiety disorder, unspecified: F41.9

## 2017-12-07 HISTORY — DX: Depression, unspecified: F32.A

## 2017-12-07 HISTORY — DX: Other chronic pain: G89.29

## 2017-12-07 HISTORY — DX: Essential (primary) hypertension: I10

## 2017-12-07 HISTORY — DX: Major depressive disorder, single episode, unspecified: F32.9

## 2017-12-07 HISTORY — DX: Unspecified asthma, uncomplicated: J45.909

## 2017-12-07 HISTORY — DX: Low back pain: M54.5

## 2017-12-07 HISTORY — DX: Personal history of other medical treatment: Z92.89

## 2017-12-07 HISTORY — DX: Unspecified osteoarthritis, unspecified site: M19.90

## 2017-12-07 HISTORY — DX: Sleep apnea, unspecified: G47.30

## 2017-12-07 HISTORY — DX: Gastro-esophageal reflux disease without esophagitis: K21.9

## 2017-12-07 HISTORY — DX: Guillain-Barre syndrome: G61.0

## 2017-12-07 HISTORY — DX: Hematemesis: K92.0

## 2017-12-07 HISTORY — DX: Type 2 diabetes mellitus without complications: E11.9

## 2017-12-07 HISTORY — DX: Personal history of other diseases of the digestive system: Z87.19

## 2017-12-07 HISTORY — DX: Low back pain, unspecified: M54.50

## 2017-12-07 HISTORY — DX: Pure hypercholesterolemia, unspecified: E78.00

## 2017-12-07 HISTORY — DX: Cerebral infarction, unspecified: I63.9

## 2017-12-07 HISTORY — DX: Pneumonia, unspecified organism: J18.9

## 2017-12-07 HISTORY — DX: Heart failure, unspecified: I50.9

## 2017-12-07 HISTORY — DX: Fibromyalgia: M79.7

## 2017-12-07 HISTORY — DX: Gastric ulcer, unspecified as acute or chronic, without hemorrhage or perforation: K25.9

## 2017-12-07 HISTORY — DX: Unspecified dementia, unspecified severity, without behavioral disturbance, psychotic disturbance, mood disturbance, and anxiety: F03.90

## 2017-12-07 HISTORY — DX: Chronic obstructive pulmonary disease, unspecified: J44.9

## 2017-12-07 LAB — COMPREHENSIVE METABOLIC PANEL
ALT: 44 U/L (ref 0–44)
ANION GAP: 12 (ref 5–15)
AST: 40 U/L (ref 15–41)
Albumin: 3.7 g/dL (ref 3.5–5.0)
Alkaline Phosphatase: 102 U/L (ref 38–126)
BUN: 10 mg/dL (ref 8–23)
CALCIUM: 9.2 mg/dL (ref 8.9–10.3)
CO2: 22 mmol/L (ref 22–32)
CREATININE: 1.12 mg/dL — AB (ref 0.44–1.00)
Chloride: 100 mmol/L (ref 98–111)
GFR, EST AFRICAN AMERICAN: 55 mL/min — AB (ref >60)
GFR, EST NON AFRICAN AMERICAN: 48 mL/min — AB (ref >60)
Glucose, Bld: 112 mg/dL — ABNORMAL HIGH (ref 70–99)
Potassium: 3.8 mmol/L (ref 3.5–5.1)
SODIUM: 134 mmol/L — AB (ref 135–145)
Total Bilirubin: 2.5 mg/dL — ABNORMAL HIGH (ref 0.3–1.2)
Total Protein: 6.6 g/dL (ref 6.5–8.1)

## 2017-12-07 LAB — ABO/RH: ABO/RH(D): A POS

## 2017-12-07 LAB — CBC WITH DIFFERENTIAL/PLATELET
BASOS ABS: 0 10*3/uL (ref 0.0–0.1)
Basophils Relative: 1 %
EOS ABS: 0 10*3/uL (ref 0.0–0.7)
Eosinophils Relative: 1 %
HCT: 24.2 % — ABNORMAL LOW (ref 36.0–46.0)
Hemoglobin: 7.1 g/dL — ABNORMAL LOW (ref 12.0–15.0)
LYMPHS PCT: 20 %
Lymphs Abs: 0.9 10*3/uL (ref 0.7–4.0)
MCH: 19.3 pg — AB (ref 26.0–34.0)
MCHC: 29.3 g/dL — ABNORMAL LOW (ref 30.0–36.0)
MCV: 65.8 fL — ABNORMAL LOW (ref 78.0–100.0)
MONO ABS: 0.5 10*3/uL (ref 0.1–1.0)
Monocytes Relative: 12 %
NEUTROS PCT: 66 %
Neutro Abs: 2.9 10*3/uL (ref 1.7–7.7)
PLATELETS: 532 10*3/uL — AB (ref 150–400)
RBC: 3.68 MIL/uL — AB (ref 3.87–5.11)
RDW: 28.8 % — ABNORMAL HIGH (ref 11.5–15.5)
WBC: 4.3 10*3/uL (ref 4.0–10.5)

## 2017-12-07 LAB — CBC
HEMATOCRIT: 24.9 % — AB (ref 36.0–46.0)
HEMATOCRIT: 25.4 % — AB (ref 36.0–46.0)
HEMOGLOBIN: 7.8 g/dL — AB (ref 12.0–15.0)
Hemoglobin: 7.8 g/dL — ABNORMAL LOW (ref 12.0–15.0)
MCH: 20.6 pg — ABNORMAL LOW (ref 26.0–34.0)
MCH: 20.7 pg — ABNORMAL LOW (ref 26.0–34.0)
MCHC: 31.3 g/dL (ref 30.0–36.0)
MCHC: 30.7 g/dL (ref 30.0–36.0)
MCV: 65.9 fL — AB (ref 78.0–100.0)
MCV: 67.4 fL — ABNORMAL LOW (ref 78.0–100.0)
PLATELETS: 458 10*3/uL — AB (ref 150–400)
Platelets: 509 10*3/uL — ABNORMAL HIGH (ref 150–400)
RBC: 3.78 MIL/uL — ABNORMAL LOW (ref 3.87–5.11)
RBC: 3.77 MIL/uL — AB (ref 3.87–5.11)
RDW: 28.5 % — ABNORMAL HIGH (ref 11.5–15.5)
RDW: 28.5 % — AB (ref 11.5–15.5)
WBC: 6.3 10*3/uL (ref 4.0–10.5)
WBC: 7.1 10*3/uL (ref 4.0–10.5)

## 2017-12-07 LAB — TROPONIN I
Troponin I: <0.03 ng/mL (ref <0.03)
Troponin I: <0.03 ng/mL (ref <0.03)

## 2017-12-07 LAB — FOLATE: FOLATE: 13.1 ng/mL (ref >5.9)

## 2017-12-07 LAB — IRON AND TIBC
IRON: 74 ug/dL (ref 28–170)
Saturation Ratios: 15 % (ref 10.4–31.8)
TIBC: 496 ug/dL — AB (ref 250–450)
UIBC: 422 ug/dL

## 2017-12-07 LAB — I-STAT CHEM 8, ED
BUN: 11 mg/dL (ref 8–23)
Calcium, Ion: 1.08 mmol/L — ABNORMAL LOW (ref 1.15–1.40)
Chloride: 97 mmol/L — ABNORMAL LOW (ref 98–111)
Creatinine, Ser: 1.1 mg/dL — ABNORMAL HIGH (ref 0.44–1.00)
Glucose, Bld: 110 mg/dL — ABNORMAL HIGH (ref 70–99)
HCT: 25 % — ABNORMAL LOW (ref 36.0–46.0)
Hemoglobin: 8.5 g/dL — ABNORMAL LOW (ref 12.0–15.0)
Potassium: 3.7 mmol/L (ref 3.5–5.1)
Sodium: 133 mmol/L — ABNORMAL LOW (ref 135–145)
TCO2: 22 mmol/L (ref 22–32)

## 2017-12-07 LAB — TSH: TSH: 2.342 u[IU]/mL (ref 0.350–4.500)

## 2017-12-07 LAB — GLUCOSE, CAPILLARY
GLUCOSE-CAPILLARY: 101 mg/dL — AB (ref 70–99)
Glucose-Capillary: 125 mg/dL — ABNORMAL HIGH (ref 70–99)

## 2017-12-07 LAB — ECHOCARDIOGRAM COMPLETE
HEIGHTINCHES: 62 in
WEIGHTICAEL: 2240 [oz_av]

## 2017-12-07 LAB — RETICULOCYTES
RBC.: 3.78 MIL/uL — AB (ref 3.87–5.11)
RETIC CT PCT: 0.6 % (ref 0.4–3.1)
Retic Count, Absolute: 22.7 10*3/uL (ref 19.0–186.0)

## 2017-12-07 LAB — HEMOGLOBIN A1C
HEMOGLOBIN A1C: 6.3 % — AB (ref 4.8–5.6)
MEAN PLASMA GLUCOSE: 134.11 mg/dL

## 2017-12-07 LAB — T4, FREE: Free T4: 1.92 ng/dL — ABNORMAL HIGH (ref 0.82–1.77)

## 2017-12-07 LAB — PROTIME-INR
INR: 1.23
Prothrombin Time: 15.4 s — ABNORMAL HIGH (ref 11.4–15.2)

## 2017-12-07 LAB — BILIRUBIN, FRACTIONATED(TOT/DIR/INDIR)
Bilirubin, Direct: 0.5 mg/dL — ABNORMAL HIGH (ref 0.0–0.2)
Indirect Bilirubin: 3.3 mg/dL — ABNORMAL HIGH (ref 0.3–0.9)
Total Bilirubin: 3.8 mg/dL — ABNORMAL HIGH (ref 0.3–1.2)

## 2017-12-07 LAB — VITAMIN B12: Vitamin B-12: 610 pg/mL (ref 180–914)

## 2017-12-07 LAB — I-STAT CG4 LACTIC ACID, ED: Lactic Acid, Venous: 1.33 mmol/L (ref 0.5–1.9)

## 2017-12-07 LAB — APTT: aPTT: 27 seconds (ref 24–36)

## 2017-12-07 LAB — FERRITIN: Ferritin: 6 ng/mL — ABNORMAL LOW (ref 11–307)

## 2017-12-07 LAB — PREPARE RBC (CROSSMATCH)

## 2017-12-07 LAB — LIPASE, BLOOD: Lipase: 43 U/L (ref 11–51)

## 2017-12-07 MED ORDER — DIPHENHYDRAMINE HCL 25 MG PO CAPS
25.0000 mg | ORAL_CAPSULE | Freq: Once | ORAL | Status: AC
  Administered 2017-12-07: 25 mg via ORAL
  Filled 2017-12-07: qty 1

## 2017-12-07 MED ORDER — BISACODYL 10 MG RE SUPP: 10.0000 mg | Freq: Every day | RECTAL | Status: DC | PRN

## 2017-12-07 MED ORDER — ONDANSETRON HCL 4 MG/2ML IJ SOLN: 4.0000 mg | Freq: Four times a day (QID) | INTRAMUSCULAR | Status: DC | PRN

## 2017-12-07 MED ORDER — SENNOSIDES-DOCUSATE SODIUM 8.6-50 MG PO TABS: 1.0000 | ORAL_TABLET | Freq: Every evening | ORAL | Status: DC | PRN

## 2017-12-07 MED ORDER — INSULIN ASPART 100 UNIT/ML ~~LOC~~ SOLN
0.0000 [IU] | Freq: Three times a day (TID) | SUBCUTANEOUS | Status: DC
  Administered 2017-12-07 – 2017-12-09 (×3): 1 [IU] via SUBCUTANEOUS

## 2017-12-07 MED ORDER — ONDANSETRON HCL 4 MG/2ML IJ SOLN
4.0000 mg | Freq: Once | INTRAMUSCULAR | Status: AC
  Administered 2017-12-07: 4 mg via INTRAVENOUS
  Filled 2017-12-07: qty 2

## 2017-12-07 MED ORDER — SODIUM CHLORIDE 0.9 % IV SOLN
8.0000 mg/h | INTRAVENOUS | Status: DC
  Administered 2017-12-07 – 2017-12-09 (×5): 8 mg/h via INTRAVENOUS
  Filled 2017-12-07 (×10): qty 80

## 2017-12-07 MED ORDER — ONDANSETRON HCL 4 MG PO TABS: 4.0000 mg | ORAL_TABLET | Freq: Four times a day (QID) | ORAL | Status: DC | PRN

## 2017-12-07 MED ORDER — SODIUM CHLORIDE 0.9 % IV SOLN
INTRAVENOUS | Status: DC
  Administered 2017-12-07 – 2017-12-09 (×2): via INTRAVENOUS

## 2017-12-07 MED ORDER — DIPHENHYDRAMINE HCL 25 MG PO CAPS
25.0000 mg | ORAL_CAPSULE | Freq: Four times a day (QID) | ORAL | Status: DC | PRN
  Administered 2017-12-07: 25 mg via ORAL
  Filled 2017-12-07: qty 1

## 2017-12-07 MED ORDER — SODIUM CHLORIDE 0.9 % IV SOLN: 10.0000 mL/h | Freq: Once | INTRAVENOUS | Status: DC

## 2017-12-07 NOTE — ED Notes (Addendum)
MD notified in regards to pt reaction after receiving #3 of PRBC Blood Bank Notified - all tubing along with blood and saline returned to BB Pt received 2 units PTA at [facility], pt received 1 unit of PRBC this am here at [facility] ED.

## 2017-12-07 NOTE — ED Triage Notes (Signed)
Patient arrived with [facility] from [facility] ER , pt. Reports nausea and emesis onset yesterday , HGB= 4.8 , she received 2 units of PRBC , Lasix 40 mg IV , Protonix 801 mg IV , Zofran 4 mg and Phenergan 12.5 mg prior to arrival . Hemoccult negative , no bleeding . Transferred here for GI consult , history of gastric ulcer .

## 2017-12-07 NOTE — Consult Note (Addendum)
Referring Provider: [facility]   Primary Care Physician:  Patient, No Pcp Per Primary Gastroenterologist:  unassigned  Reason for Consultation:   GI bleed    ASSESSMENT AND PLAN:     62. 72 yo female with N,V, hematemesis, upper abdominal pain, sever microcytic anemia. History limited as she has dementia.  -Patient needs EGD. I explained procedure to her.  -Patient says that her brother and son both live with her but she makes her own medical decisions. She is okay with me calling a family member to discuss EGD and recommends getting in contact with brother Wille Glaser.  I have looked in patient contact information and Mauricio Po is her contact, I do not see her brother or son listed.  I did try and contact [name] this morning but there was no answer and no way to leave a voicemail for her.  -Keep NPO. If I can reach family and get consent then will proceed with EGD today -consider iron infusion prior to discharge  2. Microcytic anemia.  Hgb in the ED at [facility] was 4.8, up to 8.5 today after 3 units of blood. Based on MCV I suspect she has iron deficiency anemia.  -Further evaluation time of EGD.   -She is going to need a colonoscopy at some point especially if no etiology of symptoms/microcytic anemia found on upper endoscopy   3. Hypocalcemia, mild. Ionized Ca+ 1.08  4. AKI, mild.   5. Mild hyperbilirubinemia (2.5). Fractionated bili pending. Alk phos, remaining liver chemistries normal.   5. Mild cardiomegaly / vascular congestion on CXR. Echo results pending   Attending physician's note   I have taken an interval history, reviewed the chart and examined the patient. I agree with the Advanced Practitioner's note, impression and recommendations.   72 year old with dementia admitted with nausea/vomiting/hematemesis with hemoglobin 4.8 status post 3 units of PRBC to hemoglobin 8.5.  She has history of gastric ulcers and has been on nonsteroidals.  Unable to get in  touch with the family.  Plan: IV fluids, IV Protonix, trend CBC, stop all nonsteroidals, EGD in a.m. after obtaining consent from the family.  [name], MD    HPI: NATALIJA MAVIS is a 72 y.o. female with multiple medical problems as listed below.  Patient does have a history of dementia, she is able to give a limited history.  She mentions a remote history of upper GI bleeding many years ago but cannot elaborate.. Patient presented to [facility] with nausea, vomiting, coffee-ground emesis.  She describes 3 days of upper abdominal discomfort as well.  Patient says she can answer whether or not the pain radiates through to her back.  He takes a daily aspirin, she takes Aleve as well.  Not on anticoagulants.  Patient cannot tell me if she is lost any weight but says that she really has not been able to eat much because of nausea.  At [facility] ED her hemoglobin was 4.8, MCV in 60's. She revieved 2 units of blood and 1/3 unit here at [facility]. BUN normal.  Patient had to be transferred to [facility] due to lack of GI services at [facility]. Patient says she had a colonoscopy but doesn't know when or where.     Past Medical History:  Diagnosis Date  . Anxiety   . Asthma   . CHF (congestive heart failure) ([place])   . COPD (chronic obstructive pulmonary disease) ([place])   . Dementia   . Diabetes mellitus without complication (  [place])   . Gastric ulcer   . GERD (gastroesophageal reflux disease)   . 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 Hypercholesterolemia   . Hypertension   . Stroke [facility])     Past Surgical History:  Procedure Laterality Date  . ABDOMINAL HYSTERECTOMY    . TONSILLECTOMY      Prior to Admission medications   Not on File    Current Facility-Administered Medications  Medication Dose Route Frequency Provider Last Rate Last Dose  . 0.9 %  sodium chloride infusion   Intravenous Continuous Rondel Jumbo, PA-C 75 mL/hr at 12/07/17 5102    . bisacodyl (DULCOLAX)  suppository 10 mg  10 mg Rectal Daily PRN Sharene Butters E, PA-C      . insulin aspart (novoLOG) injection 0-9 Units  0-9 Units Subcutaneous TID WC Wertman, Coralee Pesa, PA-C      . ondansetron (ZOFRAN) tablet 4 mg  4 mg Oral Q6H PRN Rondel Jumbo, PA-C       Or  . ondansetron (ZOFRAN) injection 4 mg  4 mg Intravenous Q6H PRN Rondel Jumbo, PA-C      . pantoprazole (PROTONIX) 80 mg in sodium chloride 0.9 % 250 mL (0.32 mg/mL) infusion  8 mg/hr Intravenous Continuous Rondel Jumbo, PA-C 25 mL/hr at 12/07/17 0618 8 mg/hr at 12/07/17 0618  . senna-docusate (Senokot-S) tablet 1 tablet  1 tablet Oral QHS PRN Rondel Jumbo, PA-C        Allergies as of 12/07/2017 - Review Complete 12/07/2017  Allergen Reaction Noted  . Codeine Nausea And Vomiting 12/07/2017  . Lyrica [pregabalin] Other (See Comments) 12/07/2017  . Penicillins Other (See Comments) 12/07/2017    History reviewed. No pertinent family history.  Social History   Socioeconomic History  . Marital status: Single    Spouse name: Not on file  . Number of children: Not on file  . Years of education: Not on file  . Highest education level: Not on file  Occupational History  . Not on file  Social Needs  . Financial resource strain: Not on file  . Food insecurity:    Worry: Not on file    Inability: Not on file  . Transportation needs:    Medical: Not on file    Non-medical: Not on file  Tobacco Use  . Smoking status: Never Smoker  . Smokeless tobacco: Never Used  Substance and Sexual Activity  . Alcohol use: Never    Frequency: Never  . Drug use: Never  . Sexual activity: Not Currently  Lifestyle  . Physical activity:    Days per week: Not on file    Minutes per session: Not on file  . Stress: Not on file  Relationships  . Social connections:    Talks on phone: Not on file    Gets together: Not on file    Attends religious service: Not on file    Active member of club or organization: Not on file    Attends  meetings of clubs or organizations: Not on file    Relationship status: Not on file  . Intimate partner violence:    Fear of current or ex partner: Not on file    Emotionally abused: Not on file    Physically abused: Not on file    Forced sexual activity: Not on file  Other Topics Concern  . Not on file  Social History Narrative  . Not on file    Review of Systems: All systems  reviewed and negative except where noted in HPI.  Physical Exam: Vital signs in last 24 hours: Temp:  [98.2 F (36.8 C)-100.2 F (37.9 C)] 99.4 F (37.4 C) (08/05 0913) Pulse Rate:  [63-86] 71 (08/05 0913) Resp:  [16-27] 18 (08/05 0913) BP: (138-168)/(50-100) 158/85 (08/05 0913) SpO2:  [92 %-98 %] 98 % (08/05 0913) Weight:  [140 lb (63.5 kg)] 140 lb (63.5 kg) (08/05 0358)   General:   Alert, thin white female in NAD Psych:  Pleasant, cooperative. Normal mood and affect. Eyes:  Pupils equal, sclera clear, no icterus.   Conjunctiva pink. Ears:  Normal auditory acuity. Nose:  No deformity, discharge,  or lesions. Neck:  Supple; no masses Lungs:  Clear throughout to auscultation.   No wheezes, crackles, or rhonchi.  Heart:  Irregularly irregular, rate normal. BLE pedal edema 1-2+ Abdomen:  Soft, non-distended, moderate epigastric tenderness, BS active, no palp mass    Rectal:  Deferred  Msk:  Symmetrical without gross deformities. . Neurologic:  Alert and  oriented x4;  grossly normal neurologically. Skin:  Intact without significant lesions or rashes..   Intake/Output from previous day: 08/04 0701 - 08/05 0700 In: 630 [Blood:630] Out: -  Intake/Output this shift: Total I/O In: 315 [Blood:315] Out: -   Lab Results: Recent Labs    12/07/17 0411 12/07/17 0442  WBC 4.3  --   HGB 7.1* 8.5*  HCT 24.2* 25.0*  PLT 532*  --    BMET Recent Labs    12/07/17 0411 12/07/17 0442  NA 134* 133*  K 3.8 3.7  CL 100 97*  CO2 22  --   GLUCOSE 112* 110*  BUN 10 11  CREATININE 1.12* 1.10*    CALCIUM 9.2  --    LFT Recent Labs    12/07/17 0411  PROT 6.6  ALBUMIN 3.7  AST 40  ALT 44  ALKPHOS 102  BILITOT 2.5*   PT/INR Recent Labs    12/07/17 0411  LABPROT 15.4*  INR 1.23   Hepatitis Panel No results for input(s): HEPBSAG, HCVAB, HEPAIGM, HEPBIGM in the last 72 hours.   Studies/Results: Dg Chest Port 1 View  Result Date: 12/07/2017 CLINICAL DATA:  Acute onset of GI bleeding. EXAM: PORTABLE CHEST 1 VIEW COMPARISON:  Chest radiograph performed 12/06/2017 FINDINGS: The lungs are well-aerated. Right basilar airspace opacity may reflect atelectasis or possibly mild infection. Mild vascular congestion is noted. No pleural effusion or pneumothorax is seen. The cardiomediastinal silhouette is enlarged. No acute osseous abnormalities are seen. IMPRESSION: Right basilar airspace opacity may reflect atelectasis or possibly mild infection. Mild vascular congestion and cardiomegaly noted. Electronically Signed   By: Garald Balding M.D.   On: 12/07/2017 04:32   Dg Abd Portable 2 Views  Result Date: 12/07/2017 CLINICAL DATA:  72 y/o  F; GI bleed. EXAM: PORTABLE ABDOMEN - 2 VIEW COMPARISON:  06/22/2016 abdomen radiographs. 12/07/2017 chest radiograph. FINDINGS: Cardiomegaly. Right pleural effusion is stable. Aortic atherosclerosis. Hiatal hernia. Normal bowel gas pattern. Lower abdomen and pelvis are excluded from the field of view. Right upper quadrant surgical clips, presumably cholecystectomy. IMPRESSION: Normal bowel gas pattern. Lower abdomen and pelvis excluded from field of view. Stable right pleural effusion, cardiomegaly, aortic atherosclerosis. Hiatal hernia. Electronically Signed   By: Kristine Garbe M.D.   On: 12/07/2017 04:32     Tye Savoy, NP-C @  12/07/2017, 9:37 AM

## 2017-12-07 NOTE — ED Notes (Signed)
3rd unit PRBC infusing at 200 ml/hr , no adverse effect , afebrile/denies pain , IV sites unremarkable.

## 2017-12-07 NOTE — Progress Notes (Signed)
Unsuccessful attempts to reach family member regarding home meds. Consent for EGD unsigned for the same reason

## 2017-12-07 NOTE — Evaluation (Signed)
Occupational Therapy Evaluation Patient Details Name: Melissa [name] MRN: [id number] DOB: 1945-12-18 Today's Date: 12/07/2017    History of Present Illness  Melissa [name] is a 72 y.o. female with medical history significant for CHF, COPD, dementia, DM2, HTN, hyperlipidemia, remote history of GBS, remote history of CVA, and a history of gastric ulcer many years ago, presenting to [facility] initially, with 1 day history of nausea, vomiting and 7-8 episodes of coffee-ground hematemesis.     Clinical Impression   This 72 yo female admitted with above presents to acute OT with dizziness causing decreased safety and decreased balance thus affecting her independence with basic ADLs and IADLs which she is normally Independent to Mod I at home. She will benefit from acute OT without need for follow up.    Follow Up Recommendations  No OT follow up;Supervision/Assistance - 24 hour    Equipment Recommendations  None recommended by OT       Precautions / Restrictions Precautions Precautions: Fall Restrictions Weight Bearing Restrictions: No      Mobility Bed Mobility Overal bed mobility: Independent                Transfers Overall transfer level: Needs assistance Equipment used: Rolling walker (2 wheeled) Transfers: Sit to/from Stand Sit to Stand: Min guard         General transfer comment: Pt normally uses SPC    Balance Overall balance assessment: Needs assistance Sitting-balance support: No upper extremity supported;Feet supported Sitting balance-Leahy Scale: Good     Standing balance support: Bilateral upper extremity supported Standing balance-Leahy Scale: Poor Standing balance comment: reliant on RW due to dizzines on way back from bathroom with one LOB that pt caught herself by grabbing onto door jam (no dizziness upon sitting up, standing, or walking to bathroom)                           ADL either performed or assessed with clinical  judgement   ADL Overall ADL's : Needs assistance/impaired Eating/Feeding: Independent;Sitting   Grooming: Set up;Sitting   Upper Body Bathing: Set up;Sitting   Lower Body Bathing: Min guard;Sit to/from stand   Upper Body Dressing : Set up;Sitting   Lower Body Dressing: Min guard;Sit to/from stand   Toilet Transfer: Min guard;Ambulation;RW;Comfort height toilet;Grab bars   Toileting- Clothing Manipulation and Hygiene: Min guard;Sit to/from stand               Vision Patient Visual Report: No change from baseline              Pertinent Vitals/Pain Pain Assessment: No/denies pain     Hand Dominance Right   Extremity/Trunk Assessment Upper Extremity Assessment Upper Extremity Assessment: Overall WFL for tasks assessed           Communication Communication Communication: No difficulties   Cognition Arousal/Alertness: Awake/alert Behavior During Therapy: WFL for tasks assessed/performed Overall Cognitive Status: Within Functional Limits for tasks assessed                                       Home Living Family/patient expects to be discharged to:: Private residence Living Arrangements: Other relatives(brother and son) Available Help at Discharge: Family Type of Home: House Home Access: Stairs to enter [employer] of Steps: 4 Entrance Stairs-Rails: Right;Left(can't reach both) Home Layout: One level     Bathroom Shower/Tub:  Tub/shower unit;Curtain   Bathroom Toilet: Standard     Home Equipment: Environmental consultant - 2 wheels;Cane - single point;Shower seat;Hand held shower head;Grab bars - tub/shower   Additional Comments: son has seizures      Prior Functioning/Environment Level of Independence: Independent                 OT Problem List: Impaired balance (sitting and/or standing)      OT Treatment/Interventions: Self-care/ADL training;Balance training;DME and/or AE instruction;Patient/family education    OT  Goals(Current goals can be found in the care plan section) Acute Rehab OT Goals Patient Stated Goal: to go home once I feel better OT Goal Formulation: With patient Time For Goal Achievement: 12/21/17 Potential to Achieve Goals: Good  OT Frequency: Min 2X/week              AM-PAC PT "6 Clicks" Daily Activity     Outcome Measure Help from another person eating meals?: None Help from another person taking care of personal grooming?: A Little Help from another person toileting, which includes using toliet, bedpan, or urinal?: A Little Help from another person bathing (including washing, rinsing, drying)?: A Little Help from another person to put on and taking off regular upper body clothing?: A Little Help from another person to put on and taking off regular lower body clothing?: A Little 6 Click Score: 19   End of Session Equipment Utilized During Treatment: Gait belt;Rolling walker Nurse Communication: Mobility status(pt got dizzy on way back from bathroom. Pt's BP high and reports she normally takes BP meds at home as well as med for her left eye)  Activity Tolerance: (limited by dizziness post toileting) Patient left: in chair;with call bell/phone within reach;with chair alarm set  OT Visit Diagnosis: Unsteadiness on feet (R26.81)                Time: 0786-7544 OT Time Calculation (min): 31 min Charges:  OT General Charges $OT Visit: 1 Visit OT Evaluation $OT Eval Moderate Complexity: 1 Mod OT Treatments $Self Care/Home Management : 8-22 mins  [address], OTR/L [phone] 12/07/2017

## 2017-12-07 NOTE — ED Provider Notes (Signed)
[facility] EMERGENCY DEPARTMENT Provider Note   CSN: [id number] Arrival date & time: 12/07/17  0355     History   Chief Complaint Chief Complaint  Patient presents with  . Low Hemoglobin    HPI BRADEE COMMON is a 72 y.o. female.  Patient transferred from [facility] with nausea, vomiting and hematemesis.  She was found to have a hemoglobin of 4.8 and received 2 units of PRBCs, 40 mg of Lasix, Protonix 80 mg and Zofran 4 mg.  She was Hemoccult negative at the outside hospital.  Does admit to having nausea and vomiting over the past day with 7-8 episodes of coffee-ground emesis.  Dr. Lyndel Safe of GI was consulted and plan was to transfer to the ED because GI was not available at [facility].  Patient denies any chest pain or shortness of breath.  She denies any abdominal pain.  Denies having any vomiting since she arrived at the outside hospital.  No leg pain or leg swelling.  Does have a history of remote ulcers in the past.  Patient found to have atrial fibrillation and she is unclear whether this is new.  She does not take any blood thinners.  The history is provided by the patient and the EMS personnel. The history is limited by the condition of the patient.    Past Medical History:  Diagnosis Date  . Asthma   . CHF (congestive heart failure) ([place])   . COPD (chronic obstructive pulmonary disease) ([place])   . Dementia   . Diabetes mellitus without complication ([place])   . Gastric ulcer   . 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 Hypercholesterolemia   . Hypertension   . Stroke [facility])     There are no active problems to display for this patient.   Past Surgical History:  Procedure Laterality Date  . ABDOMINAL HYSTERECTOMY    . TONSILLECTOMY       OB History   None      Home Medications    Prior to Admission medications   Not on File    Family History No family history on file.  Social History Social History   Tobacco Use  . Smoking status:  Never Smoker  . Smokeless tobacco: Never Used  Substance Use Topics  . Alcohol use: Never    Frequency: Never  . Drug use: Never     Allergies   Codeine and Penicillins   Review of Systems Review of Systems  Constitutional: Negative for activity change, appetite change and fever.  HENT: Negative for congestion and rhinorrhea.   Eyes: Negative for visual disturbance.  Respiratory: Negative for chest tightness and shortness of breath.   Cardiovascular: Negative for chest pain.  Gastrointestinal: Positive for nausea and vomiting. Negative for abdominal pain and blood in stool.  Endocrine: Negative for polyuria.  Genitourinary: Negative for dysuria, hematuria, vaginal bleeding and vaginal discharge.  Musculoskeletal: Negative for arthralgias and myalgias.  Skin: Negative for rash.  Neurological: Negative for dizziness, weakness and headaches.    all other systems are negative except as noted in the HPI and PMH.    Physical Exam Updated Vital Signs BP (!) 168/100 (BP Location: Right Arm)   Pulse 63   Temp 99 F (37.2 C) (Oral)   Resp 18   Ht 5\' 2"  (1.575 m)   Wt 63.5 kg (140 lb)   SpO2 98%   BMI 25.61 kg/m   Physical Exam  Constitutional: She is oriented to person, place, and  time. She appears well-developed and well-nourished. No distress.  HENT:  Head: Normocephalic and atraumatic.  Mouth/Throat: Oropharynx is clear and moist. No oropharyngeal exudate.  Eyes: Pupils are equal, round, and reactive to light. Conjunctivae and EOM are normal.  Neck: Normal range of motion. Neck supple.  No meningismus.  Cardiovascular: Normal rate, normal heart sounds and intact distal pulses.  No murmur heard. Irregular rhythm  Pulmonary/Chest: Effort normal and breath sounds normal. No respiratory distress.  Abdominal: Soft. There is no tenderness. There is no rebound and no guarding.  Musculoskeletal: Normal range of motion. She exhibits no edema or tenderness.  Neurological: She  is alert and oriented to person, place, and time. No cranial nerve deficit. She exhibits normal muscle tone. Coordination normal.   5/5 strength throughout. CN 2-12 intact.Equal grip strength.   Skin: Skin is warm. Capillary refill takes less than 2 seconds. No rash noted.  Psychiatric: She has a normal mood and affect. Her behavior is normal.  Nursing note and vitals reviewed.    ED Treatments / Results  Labs (all labs ordered are listed, but only abnormal results are displayed) Labs Reviewed  COMPREHENSIVE METABOLIC PANEL - Abnormal; Notable for the following components:      Result Value   Sodium 134 (*)    Glucose, Bld 112 (*)    Creatinine, Ser 1.12 (*)    Total Bilirubin 2.5 (*)    GFR calc non Af Amer 48 (*)    GFR calc Af Amer 55 (*)    All other components within normal limits  CBC WITH DIFFERENTIAL/PLATELET - Abnormal; Notable for the following components:   RBC 3.68 (*)    Hemoglobin 7.1 (*)    HCT 24.2 (*)    MCV 65.8 (*)    MCH 19.3 (*)    MCHC 29.3 (*)    RDW 28.8 (*)    Platelets 532 (*)    All other components within normal limits  PROTIME-INR - Abnormal; Notable for the following components:   Prothrombin Time 15.4 (*)    All other components within normal limits  I-STAT CHEM 8, ED - Abnormal; Notable for the following components:   Sodium 133 (*)    Chloride 97 (*)    Creatinine, Ser 1.10 (*)    Glucose, Bld 110 (*)    Calcium, Ion 1.08 (*)    Hemoglobin 8.5 (*)    HCT 25.0 (*)    All other components within normal limits  APTT  LIPASE, BLOOD  I-STAT CG4 LACTIC ACID, ED  TYPE AND SCREEN  ABO/RH  PREPARE RBC (CROSSMATCH)    EKG EKG Interpretation  Date/Time:  Monday December 07 2017 03:56:26 EDT Ventricular Rate:  99 PR Interval:    QRS Duration: 83 QT Interval:  386 QTC Calculation: 496 R Axis:   69 Text Interpretation:  Atrial fibrillation Ventricular premature complex Borderline prolonged QT interval No previous ECGs available Confirmed  by Ezequiel Essex [phone]) on 12/07/2017 4:19:36 AM   Radiology Dg Chest Port 1 View  Result Date: 12/07/2017 CLINICAL DATA:  Acute onset of GI bleeding. EXAM: PORTABLE CHEST 1 VIEW COMPARISON:  Chest radiograph performed 12/06/2017 FINDINGS: The lungs are well-aerated. Right basilar airspace opacity may reflect atelectasis or possibly mild infection. Mild vascular congestion is noted. No pleural effusion or pneumothorax is seen. The cardiomediastinal silhouette is enlarged. No acute osseous abnormalities are seen. IMPRESSION: Right basilar airspace opacity may reflect atelectasis or possibly mild infection. Mild vascular congestion and cardiomegaly noted. Electronically Signed  By: Garald Balding M.D.   On: 12/07/2017 04:32   Dg Abd Portable 2 Views  Result Date: 12/07/2017 CLINICAL DATA:  71 y/o  F; GI bleed. EXAM: PORTABLE ABDOMEN - 2 VIEW COMPARISON:  06/22/2016 abdomen radiographs. 12/07/2017 chest radiograph. FINDINGS: Cardiomegaly. Right pleural effusion is stable. Aortic atherosclerosis. Hiatal hernia. Normal bowel gas pattern. Lower abdomen and pelvis are excluded from the field of view. Right upper quadrant surgical clips, presumably cholecystectomy. IMPRESSION: Normal bowel gas pattern. Lower abdomen and pelvis excluded from field of view. Stable right pleural effusion, cardiomegaly, aortic atherosclerosis. Hiatal hernia. Electronically Signed   By: Kristine Garbe M.D.   On: 12/07/2017 04:32    Procedures Procedures (including critical care time)  Medications Ordered in ED Medications  pantoprazole (PROTONIX) 80 mg in sodium chloride 0.9 % 250 mL (0.32 mg/mL) infusion (has no administration in time range)  ondansetron (ZOFRAN) injection 4 mg (4 mg Intravenous Given 12/07/17 0419)     Initial Impression / Assessment and Plan / ED Course  I have reviewed the triage vital signs and the nursing notes.  Pertinent labs & imaging results that were available during my care of the  patient were reviewed by me and considered in my medical decision making (see chart for details).    Patient transferred from outside hospital with hematemesis and hemoglobin of 4.8.  She received 2 units of packed red blood cells as well as 80 mg of Protonix.  Stable vital signs on arrival.  She denies chest pain or abdominal pain.  Hemoglobin 7.1 on arrival which is improved from 4.8.  This is after 2 units of packed red blood cells.  Will order 1 unit of additional blood. Protonix drip ordered.  No nausea or vomiting throughout ED stay.  Abdomen is soft and nontender. Dr. Lyndel Safe of GI was consulted by outside hospital regarding this patient.   admission discussed with Dr. Myna Hidalgo.  CRITICAL CARE Performed by: Ezequiel Essex Total critical care time: 35 minutes Critical care time was exclusive of separately billable procedures and treating other patients. Critical care was necessary to treat or prevent imminent or life-threatening deterioration. Critical care was time spent personally by me on the following activities: development of treatment plan with patient and/or surrogate as well as nursing, discussions with consultants, evaluation of patient's response to treatment, examination of patient, obtaining history from patient or surrogate, ordering and performing treatments and interventions, ordering and review of laboratory studies, ordering and review of radiographic studies, pulse oximetry and re-evaluation of patient's condition.  Final Clinical Impressions(s) / ED Diagnoses   Final diagnoses:  GI bleed  Symptomatic anemia    ED Discharge Orders    None       Ramla Hase, Annie Main, MD 12/07/17 [id number]

## 2017-12-07 NOTE — ED Notes (Signed)
Patient signed consent form for blood transfusion . 

## 2017-12-07 NOTE — ED Notes (Addendum)
Paged MD in regards to elevated temp and itching after pt received #3 of 3 PRBC's

## 2017-12-07 NOTE — ED Notes (Signed)
Pharmacist notified on pt.'s Protonix order.

## 2017-12-07 NOTE — H&P (Addendum)
History and Physical    Melissa [name] KGM:[id number] DOB: Mar 15, 1946 DOA: 12/07/2017   PCP: Patient, No Pcp Per   Patient coming from:  Home    Chief Complaint: Hematemesis   HPI: Melissa [name] is a 72 y.o. female with medical history significant for CHF, COPD, dementia, history of diet-controlled diabetes, hypertension, hyperlipidemia, remote history of Guillain-Barr, remote history of stroke, and a history of gastric ulcer many years ago, presenting to [name] initially, with 1 day history of nausea, vomiting and 7-8 episodes of coffee-ground hematemesis.  She reports that many years ago she may have had similar episodes.  She does take daily aspirin, and other NSAIDs including Aleve. Not on anticoagulants at this time.  She denies any hematochezia.  Her last endoscopies were in 1996.  Patient denies any history of Barrett's esophagus and she denies any history of tobacco, alcohol or recreational drug use.  She denies any history of GI cancers. She denies any vaginal bleed, epistaxis, gum bleeding, or hemoptysis.   The patient denied any shortness of breath, cough, recent illnesses or antibiotics, no pain or cramping.  She feels very tired over the last 3 days.  She may have a history of anemia, but she is not on iron supplements.  She denies pica.  She denies any significant amount of caffeine in her diet.  No new changes in her medications.  At [name], the patient was found to have a hemoglobin of 4.8, receiving 2 units of blood, and then transferred to [name] for further evaluation and management of her symptoms, as [name] does not have a GI service at this time.  She has not seen a GI physician in many years.  No recent surgeries.    ED Course:  BP (!) 142/92   Pulse 83   Temp 98.2 F (36.8 C) (Oral)   Resp 16   Ht 5\' 2"  (1.575 m)   Wt 63.5 kg (140 lb)   SpO2 92%   BMI 25.61 kg/m   Lactic acid 1.33 Hemoglobin as mentioned above, was 4.8, then 7.1 after 2  units, and now 8,1 to 8.5 after  more unit of blood.  MCV was 65.8 Bilirubin was 2.6 Creatinine 1.1 X-ray right basilar airspace opacity likely atelectasis, mild vascular congestion and cardiomegaly. EKG atrial fibrillation Ventricular premature complex Borderline prolonged QT interval. No previous ECGs available.  Patient reports that she does have a history of irregular heartbeat, and had taken several years ago blood thinners but stopped when she ran out of med , although no proper diagnosis of atrial fibrillation is found in chart.  Review of Systems:  As per HPI otherwise all other systems reviewed and are negative  Past Medical History:  Diagnosis Date  . Asthma   . CHF (congestive heart failure) ([place])   . COPD (chronic obstructive pulmonary disease) ([place])   . Dementia   . Diabetes mellitus without complication ([place])   . Gastric ulcer   . 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 Hypercholesterolemia   . Hypertension   . Stroke [facility])     Past Surgical History:  Procedure Laterality Date  . ABDOMINAL HYSTERECTOMY    . TONSILLECTOMY      Social History Social History   Socioeconomic History  . Marital status: Single    Spouse name: Not on file  . Number of children: Not on file  . Years of education: Not on file  . Highest education level: Not on file  Occupational  History  . Not on file  Social Needs  . Financial resource strain: Not on file  . Food insecurity:    Worry: Not on file    Inability: Not on file  . Transportation needs:    Medical: Not on file    Non-medical: Not on file  Tobacco Use  . Smoking status: Never Smoker  . Smokeless tobacco: Never Used  Substance and Sexual Activity  . Alcohol use: Never    Frequency: Never  . Drug use: Never  . Sexual activity: Not on file  Lifestyle  . Physical activity:    Days per week: Not on file    Minutes per session: Not on file  . Stress: Not on file  Relationships  . Social connections:    Talks on phone:  Not on file    Gets together: Not on file    Attends religious service: Not on file    Active member of club or organization: Not on file    Attends meetings of clubs or organizations: Not on file    Relationship status: Not on file  . Intimate partner violence:    Fear of current or ex partner: Not on file    Emotionally abused: Not on file    Physically abused: Not on file    Forced sexual activity: Not on file  Other Topics Concern  . Not on file  Social History Narrative  . Not on file     Allergies  Allergen Reactions  . Codeine Nausea And Vomiting  . Lyrica [Pregabalin] Other (See Comments)    unknown  . Penicillins Other (See Comments)    Childhood allergy     History reviewed. No pertinent family history.     Prior to Admission medications   Not on File     Physical Exam:  Vitals:   12/07/17 0415 12/07/17 0600 12/07/17 0615 12/07/17 0616  BP: (!) 162/89 (!) 167/83 138/67 (!) 142/92  Pulse: 84 84 85 83  Resp: (!) 27 18 (!) 24 16  Temp:  98.9 F (37.2 C)  98.2 F (36.8 C)  TempSrc:  Oral  Oral  SpO2: 98% 98% 92%   Weight:      Height:       Constitutional: NAD, calm, comfortable, frail appearing Eyes: PERRL, lids and conjunctivae pale  ENMT: Mucous membranes are moist, without exudate or lesions  Neck: normal, supple, no masses, no thyromegaly Respiratory: clear to auscultation bilaterally, no wheezing, no crackles. Normal respiratory effort  Cardiovascular: Regular rate and rhythm with occasional ectopic beats, no  murmur, rubs or gallops. No extremity edema. 2+ pedal pulses. No carotid bruits.  Abdomen: Soft, non tender, No hepatosplenomegaly. Bowel sounds positive.  Musculoskeletal: no clubbing / cyanosis. Moves all extremities Skin: no jaundice, No lesions. Pale Neurologic: Sensation intact  Strength equal in all extremities Psychiatric:   Alert and oriented x 3. Normal mood.     Labs on Admission: I have personally reviewed following labs and  imaging studies  CBC: Recent Labs  Lab 12/07/17 0411 12/07/17 0442  WBC 4.3  --   NEUTROABS 2.9  --   HGB 7.1* 8.5*  HCT 24.2* 25.0*  MCV 65.8*  --   PLT 532*  --     Basic Metabolic Panel: Recent Labs  Lab 12/07/17 0411 12/07/17 0442  NA 134* 133*  K 3.8 3.7  CL 100 97*  CO2 22  --   GLUCOSE 112* 110*  BUN 10 11  CREATININE  1.12* 1.10*  CALCIUM 9.2  --     GFR: Estimated Creatinine Clearance: 39.9 mL/min (A) (by C-G formula based on SCr of 1.1 mg/dL (H)).  Liver Function Tests: Recent Labs  Lab 12/07/17 0411  AST 40  ALT 44  ALKPHOS 102  BILITOT 2.5*  PROT 6.6  ALBUMIN 3.7   Recent Labs  Lab 12/07/17 0411  LIPASE 43   No results for input(s): AMMONIA in the last 168 hours.  Coagulation Profile: Recent Labs  Lab 12/07/17 0411  INR 1.23    Cardiac Enzymes: No results for input(s): CKTOTAL, CKMB, CKMBINDEX, TROPONINI in the last 168 hours.  BNP (last 3 results) No results for input(s): PROBNP in the last 8760 hours.  HbA1C: No results for input(s): HGBA1C in the last 72 hours.  CBG: No results for input(s): GLUCAP in the last 168 hours.  Lipid Profile: No results for input(s): CHOL, HDL, LDLCALC, TRIG, CHOLHDL, LDLDIRECT in the last 72 hours.  Thyroid Function Tests: No results for input(s): TSH, T4TOTAL, FREET4, T3FREE, THYROIDAB in the last 72 hours.  Anemia Panel: No results for input(s): VITAMINB12, FOLATE, FERRITIN, TIBC, IRON, RETICCTPCT in the last 72 hours.  Urine analysis: No results found for: COLORURINE, APPEARANCEUR, LABSPEC, PHURINE, GLUCOSEU, HGBUR, BILIRUBINUR, KETONESUR, PROTEINUR, UROBILINOGEN, NITRITE, LEUKOCYTESUR  Sepsis Labs: @LABRCNTIP (procalcitonin:4,lacticidven:4) )No results found for this or any previous visit (from the past 240 hour(s)).   Radiological Exams on Admission: Dg Chest Port 1 View  Result Date: 12/07/2017 CLINICAL DATA:  Acute onset of GI bleeding. EXAM: PORTABLE CHEST 1 VIEW COMPARISON:   Chest radiograph performed 12/06/2017 FINDINGS: The lungs are well-aerated. Right basilar airspace opacity may reflect atelectasis or possibly mild infection. Mild vascular congestion is noted. No pleural effusion or pneumothorax is seen. The cardiomediastinal silhouette is enlarged. No acute osseous abnormalities are seen. IMPRESSION: Right basilar airspace opacity may reflect atelectasis or possibly mild infection. Mild vascular congestion and cardiomegaly noted. Electronically Signed   By: Garald Balding M.D.   On: 12/07/2017 04:32   Dg Abd Portable 2 Views  Result Date: 12/07/2017 CLINICAL DATA:  72 y/o  F; GI bleed. EXAM: PORTABLE ABDOMEN - 2 VIEW COMPARISON:  06/22/2016 abdomen radiographs. 12/07/2017 chest radiograph. FINDINGS: Cardiomegaly. Right pleural effusion is stable. Aortic atherosclerosis. Hiatal hernia. Normal bowel gas pattern. Lower abdomen and pelvis are excluded from the field of view. Right upper quadrant surgical clips, presumably cholecystectomy. IMPRESSION: Normal bowel gas pattern. Lower abdomen and pelvis excluded from field of view. Stable right pleural effusion, cardiomegaly, aortic atherosclerosis. Hiatal hernia. Electronically Signed   By: Kristine Garbe M.D.   On: 12/07/2017 04:32    EKG: Independently reviewed.  Assessment/Plan Principal Problem:   Hematemesis Active Problems:   Acute blood loss anemia   Dementia   CHF (congestive heart failure) (HCC)   COPD (chronic obstructive pulmonary disease) (H[facility])   History of completed stroke   Unspecified atrial fibrillation (HCC)   CKD (chronic kidney disease), stage III (H[facility])   Gastro Intestinal Bleed.  Patient has a history of GERD-hiatal hernia. Presented with coffee-ground emesis over the last 24 hours, a total of 78 episodes.  Etiology is unknown at this time.  Hemoglobin was 4.8 on presentation, improving to 7.1 after 2 units of blood, and now at 8.5 after more unit.  BUN is 11.  Hemoccult was negative.  He  was transferred from [facility], for continuation of care, due to absence of GI service at that facility. Last colonoscopy/EGD 1996 Inpatient telemetry  Continue  IV Protonix drip. IVF  Hold BP meds for now NPO until GI evaluation Hold NSAIDs including preadmission baby aspirin Cycle CBC every 6 hours Continue to transfuse for hemoglobin less than 8.   Anemia of  GIB and possible Iron deficiency  Hemoglobin on admission Hemoglobin was 4.8 on presentation, improving to 7.1 after 2 units of blood, and now at 8.5 after more unit. Hcult neg. MCV 65, tbil 2.6  Repeat CBC q 6 h  Transfuse to maintain Hb at 8  Anemia panel Fractionated bilirubin    Atrial Fibrillation CHA2DS2-VASc Score 7.Patient was on anticoags until "ran out", she is on daily ASA   INR on admission 1.23 No prior EKGs are available for review to confirm. New EKG is pending.  Denies chest pain or palpitations. Check troponin  2 D echo  TSH/free T4 If RVR noted, will consult Cards    Hypertension BP  142/92   Pulse 83 Continue home anti-hypertensive medications  Add Hydralazine Q6 hours as needed for BP 160/90   Hyperlipidemia Continue home statins   Diastolic CHF: No acute decompensation weight140 lbs  Obtain daily weights Monitor intake and output Resume meds if BP allows   COPD without exacerbation Osats    Continue nebs and O2      Acute on Chronic CKD stage III: likely due to dehydration and ACE I or AR, diuretics and or NSAIDS.  Current Cr is 1.1, BL 0.85.  Baseline 0.85.  The patient did receive 1 dose of Lasix between transfusion. Lab Results  Component Value Date   CREATININE 1.10 (H) 12/07/2017   CREATININE 1.12 (H) 12/07/2017   CREATININE 0.85 01/08/2008  Hold diuretics  IVF  Follow chemistries  Type II Diabetes Current blood sugar level is 110. Not on meds No results found for: HGBA1C Hgb A1C  SSI    History of CVA , no acute issues  PT and OT   Generalized anxiety  disorder Continue meds    Chronic back pain without sciatica, no active issues  Avoid high doses of  tylenol due to acute elevation of Bili  and NSAIDS due to GIB   Senile dementia-insomnia Continue meds   DVT prophylaxis:  SCD Code Status:    Full  Family Communication:  Discussed with patient Disposition Plan: Expect patient to be discharged to home after condition improves Consults called:    GI   Admission status:  Tele Ip    Sharene Butters, [name]-C [facility]   Amion text  [phone]   12/07/2017, 6:32 AM

## 2017-12-07 NOTE — ED Notes (Signed)
3rd unit of PRBC started at 120 ml/hr , denies SOB , no fever , IV sites intact .

## 2017-12-07 NOTE — ED Notes (Signed)
Attempted report x1. 

## 2017-12-07 NOTE — H&P (View-Only) (Signed)
Referring Provider: [facility]   Primary Care Physician:  Patient, No Pcp Per Primary Gastroenterologist:  unassigned  Reason for Consultation:   GI bleed    ASSESSMENT AND PLAN:     60. 72 yo female with N,V, hematemesis, upper abdominal pain, sever microcytic anemia. History limited as she has dementia.  -Patient needs EGD. I explained procedure to her.  -Patient says that her brother and son both live with her but she makes her own medical decisions. She is okay with me calling a family member to discuss EGD and recommends getting in contact with brother Melissa [name].  I have looked in patient contact information and Melissa [name] is her contact, I do not see her brother or son listed.  I did try and contact Ms. Melissa [name] this morning but there was no answer and no way to leave a voicemail for her.  -Keep NPO. If I can reach family and get consent then will proceed with EGD today -consider iron infusion prior to discharge  2. Microcytic anemia.  Hgb in the ED at [facility] was 4.8, up to 8.5 today after 3 units of blood. Based on MCV I suspect she has iron deficiency anemia.  -Further evaluation time of EGD.   -She is going to need a colonoscopy at some point especially if no etiology of symptoms/microcytic anemia found on upper endoscopy   3. Hypocalcemia, mild. Ionized Ca+ 1.08  4. AKI, mild.   5. Mild hyperbilirubinemia (2.5). Fractionated bili pending. Alk phos, remaining liver chemistries normal.   5. Mild cardiomegaly / vascular congestion on CXR. Echo results pending   Attending physician's note   I have taken an interval history, reviewed the chart and examined the patient. I agree with the Advanced Practitioner's note, impression and recommendations.   72 year old with dementia admitted with nausea/vomiting/hematemesis with hemoglobin 4.8 status post 3 units of PRBC to hemoglobin 8.5.  She has history of gastric ulcers and has been on nonsteroidals.  Unable to get in  touch with the family.  Plan: IV fluids, IV Protonix, trend CBC, stop all nonsteroidals, EGD in a.m. after obtaining consent from the family.  [name], MD    HPI: Melissa [name] is a 72 y.o. female with multiple medical problems as listed below.  Patient does have a history of dementia, she is able to give a limited history.  She mentions a remote history of upper GI bleeding many years ago but cannot elaborate.. Patient presented to [facility] with nausea, vomiting, coffee-ground emesis.  She describes 3 days of upper abdominal discomfort as well.  Patient says she can answer whether or not the pain radiates through to her back.  He takes a daily aspirin, she takes Aleve as well.  Not on anticoagulants.  Patient cannot tell me if she is lost any weight but says that she really has not been able to eat much because of nausea.  At [facility] ED her hemoglobin was 4.8, MCV in 60's. She revieved 2 units of blood and 1/3 unit here at [facility]. BUN normal.  Patient had to be transferred to [facility] due to lack of GI services at [facility]. Patient says she had a colonoscopy but doesn't know when or where.     Past Medical History:  Diagnosis Date  . Anxiety   . Asthma   . CHF (congestive heart failure) ([place])   . COPD (chronic obstructive pulmonary disease) ([place])   . Dementia   . Diabetes mellitus without complication (  HCC)   . Gastric ulcer   . GERD (gastroesophageal reflux disease)   . Guillain Barr syndrome (HCC)   . Hypercholesterolemia   . Hypertension   . Stroke (HCC)     Past Surgical History:  Procedure Laterality Date  . ABDOMINAL HYSTERECTOMY    . TONSILLECTOMY      Prior to Admission medications   Not on File    Current Facility-Administered Medications  Medication Dose Route Frequency Provider Last Rate Last Dose  . 0.9 %  sodium chloride infusion   Intravenous Continuous Wertman, Sara E, PA-C 75 mL/hr at 12/07/17 0824    . bisacodyl (DULCOLAX)  suppository 10 mg  10 mg Rectal Daily PRN Wertman, Sara E, PA-C      . insulin aspart (novoLOG) injection 0-9 Units  0-9 Units Subcutaneous TID WC Wertman, Sara E, PA-C      . ondansetron (ZOFRAN) tablet 4 mg  4 mg Oral Q6H PRN Wertman, Sara E, PA-C       Or  . ondansetron (ZOFRAN) injection 4 mg  4 mg Intravenous Q6H PRN Wertman, Sara E, PA-C      . pantoprazole (PROTONIX) 80 mg in sodium chloride 0.9 % 250 mL (0.32 mg/mL) infusion  8 mg/hr Intravenous Continuous Wertman, Sara E, PA-C 25 mL/hr at 12/07/17 0618 8 mg/hr at 12/07/17 0618  . senna-docusate (Senokot-S) tablet 1 tablet  1 tablet Oral QHS PRN Wertman, Sara E, PA-C        Allergies as of 12/07/2017 - Review Complete 12/07/2017  Allergen Reaction Noted  . Codeine Nausea And Vomiting 12/07/2017  . Lyrica [pregabalin] Other (See Comments) 12/07/2017  . Penicillins Other (See Comments) 12/07/2017    History reviewed. No pertinent family history.  Social History   Socioeconomic History  . Marital status: Single    Spouse name: Not on file  . Number of children: Not on file  . Years of education: Not on file  . Highest education level: Not on file  Occupational History  . Not on file  Social Needs  . Financial resource strain: Not on file  . Food insecurity:    Worry: Not on file    Inability: Not on file  . Transportation needs:    Medical: Not on file    Non-medical: Not on file  Tobacco Use  . Smoking status: Never Smoker  . Smokeless tobacco: Never Used  Substance and Sexual Activity  . Alcohol use: Never    Frequency: Never  . Drug use: Never  . Sexual activity: Not Currently  Lifestyle  . Physical activity:    Days per week: Not on file    Minutes per session: Not on file  . Stress: Not on file  Relationships  . Social connections:    Talks on phone: Not on file    Gets together: Not on file    Attends religious service: Not on file    Active member of club or organization: Not on file    Attends  meetings of clubs or organizations: Not on file    Relationship status: Not on file  . Intimate partner violence:    Fear of current or ex partner: Not on file    Emotionally abused: Not on file    Physically abused: Not on file    Forced sexual activity: Not on file  Other Topics Concern  . Not on file  Social History Narrative  . Not on file    Review of Systems: All systems   reviewed and negative except where noted in HPI.  Physical Exam: Vital signs in last 24 hours: Temp:  [98.2 F (36.8 C)-100.2 F (37.9 C)] 99.4 F (37.4 C) (08/05 0913) Pulse Rate:  [63-86] 71 (08/05 0913) Resp:  [16-27] 18 (08/05 0913) BP: (138-168)/(50-100) 158/85 (08/05 0913) SpO2:  [92 %-98 %] 98 % (08/05 0913) Weight:  [140 lb (63.5 kg)] 140 lb (63.5 kg) (08/05 0358)   General:   Alert, thin white female in NAD Psych:  Pleasant, cooperative. Normal mood and affect. Eyes:  Pupils equal, sclera clear, no icterus.   Conjunctiva pink. Ears:  Normal auditory acuity. Nose:  No deformity, discharge,  or lesions. Neck:  Supple; no masses Lungs:  Clear throughout to auscultation.   No wheezes, crackles, or rhonchi.  Heart:  Irregularly irregular, rate normal. BLE pedal edema 1-2+ Abdomen:  Soft, non-distended, moderate epigastric tenderness, BS active, no palp mass    Rectal:  Deferred  Msk:  Symmetrical without gross deformities. . Neurologic:  Alert and  oriented x4;  grossly normal neurologically. Skin:  Intact without significant lesions or rashes..   Intake/Output from previous day: 08/04 0701 - 08/05 0700 In: 630 [Blood:630] Out: -  Intake/Output this shift: Total I/O In: 315 [Blood:315] Out: -   Lab Results: Recent Labs    12/07/17 0411 12/07/17 0442  WBC 4.3  --   HGB 7.1* 8.5*  HCT 24.2* 25.0*  PLT 532*  --    BMET Recent Labs    12/07/17 0411 12/07/17 0442  NA 134* 133*  K 3.8 3.7  CL 100 97*  CO2 22  --   GLUCOSE 112* 110*  BUN 10 11  CREATININE 1.12* 1.10*    CALCIUM 9.2  --    LFT Recent Labs    12/07/17 0411  PROT 6.6  ALBUMIN 3.7  AST 40  ALT 44  ALKPHOS 102  BILITOT 2.5*   PT/INR Recent Labs    12/07/17 0411  LABPROT 15.4*  INR 1.23   Hepatitis Panel No results for input(s): HEPBSAG, HCVAB, HEPAIGM, HEPBIGM in the last 72 hours.   Studies/Results: Dg Chest Port 1 View  Result Date: 12/07/2017 CLINICAL DATA:  Acute onset of GI bleeding. EXAM: PORTABLE CHEST 1 VIEW COMPARISON:  Chest radiograph performed 12/06/2017 FINDINGS: The lungs are well-aerated. Right basilar airspace opacity may reflect atelectasis or possibly mild infection. Mild vascular congestion is noted. No pleural effusion or pneumothorax is seen. The cardiomediastinal silhouette is enlarged. No acute osseous abnormalities are seen. IMPRESSION: Right basilar airspace opacity may reflect atelectasis or possibly mild infection. Mild vascular congestion and cardiomegaly noted. Electronically Signed   By: Garald Balding M.D.   On: 12/07/2017 04:32   Dg Abd Portable 2 Views  Result Date: 12/07/2017 CLINICAL DATA:  72 y/o  F; GI bleed. EXAM: PORTABLE ABDOMEN - 2 VIEW COMPARISON:  06/22/2016 abdomen radiographs. 12/07/2017 chest radiograph. FINDINGS: Cardiomegaly. Right pleural effusion is stable. Aortic atherosclerosis. Hiatal hernia. Normal bowel gas pattern. Lower abdomen and pelvis are excluded from the field of view. Right upper quadrant surgical clips, presumably cholecystectomy. IMPRESSION: Normal bowel gas pattern. Lower abdomen and pelvis excluded from field of view. Stable right pleural effusion, cardiomegaly, aortic atherosclerosis. Hiatal hernia. Electronically Signed   By: Kristine Garbe M.D.   On: 12/07/2017 04:32     Tye Savoy, NP-C @  12/07/2017, 9:37 AM

## 2017-12-07 NOTE — Progress Notes (Signed)
  Echocardiogram 2D Echocardiogram has been performed.  Melissa [name] 12/07/2017, 2:21 PM

## 2017-12-08 ENCOUNTER — Encounter (HOSPITAL_COMMUNITY): Payer: Self-pay

## 2017-12-08 ENCOUNTER — Inpatient Hospital Stay (HOSPITAL_COMMUNITY): Payer: Medicare Other

## 2017-12-08 ENCOUNTER — Encounter (HOSPITAL_COMMUNITY)
Admission: EM | Disposition: A | Discharge status: Home / Self Care | Payer: Self-pay | Source: Other Acute Inpatient Hospital | Attending: Family Medicine

## 2017-12-08 ENCOUNTER — Other Ambulatory Visit: Payer: Self-pay

## 2017-12-08 ENCOUNTER — Inpatient Hospital Stay (HOSPITAL_COMMUNITY): Payer: Medicare Other | Admitting: Anesthesiology

## 2017-12-08 DIAGNOSIS — I313 Pericardial effusion (noninflammatory): Secondary | ICD-10-CM

## 2017-12-08 HISTORY — PX: BIOPSY: SHX5522

## 2017-12-08 HISTORY — PX: ESOPHAGOGASTRODUODENOSCOPY ENDOSCOPY: SHX5814

## 2017-12-08 HISTORY — PX: ESOPHAGOGASTRODUODENOSCOPY: SHX5428

## 2017-12-08 LAB — CBC
HEMATOCRIT: 26.2 % — AB (ref 36.0–46.0)
HEMATOCRIT: 27.6 % — AB (ref 36.0–46.0)
HEMOGLOBIN: 7.8 g/dL — AB (ref 12.0–15.0)
HEMOGLOBIN: 8.3 g/dL — AB (ref 12.0–15.0)
MCH: 20.5 pg — ABNORMAL LOW (ref 26.0–34.0)
MCH: 20.6 pg — ABNORMAL LOW (ref 26.0–34.0)
MCHC: 29.8 g/dL — ABNORMAL LOW (ref 30.0–36.0)
MCHC: 30.1 g/dL (ref 30.0–36.0)
MCV: 68.8 fL — AB (ref 78.0–100.0)
MCV: 68.7 fL — AB (ref 78.0–100.0)
Platelets: 467 10*3/uL — ABNORMAL HIGH (ref 150–400)
Platelets: 515 10*3/uL — ABNORMAL HIGH (ref 150–400)
RBC: 3.81 MIL/uL — AB (ref 3.87–5.11)
RBC: 4.02 MIL/uL (ref 3.87–5.11)
RDW: 29.1 % — ABNORMAL HIGH (ref 11.5–15.5)
RDW: 29.2 % — ABNORMAL HIGH (ref 11.5–15.5)
WBC: 7 10*3/uL (ref 4.0–10.5)
WBC: 6.2 10*3/uL (ref 4.0–10.5)

## 2017-12-08 LAB — BPAM RBC
Blood Product Expiration Date: 201908262359
ISSUE DATE / TIME: 201908050553
UNIT TYPE AND RH: 6200

## 2017-12-08 LAB — BASIC METABOLIC PANEL
ANION GAP: 13 (ref 5–15)
BUN: 9 mg/dL (ref 8–23)
CO2: 20 mmol/L — AB (ref 22–32)
Calcium: 8.7 mg/dL — ABNORMAL LOW (ref 8.9–10.3)
Chloride: 97 mmol/L — ABNORMAL LOW (ref 98–111)
Creatinine, Ser: 1.13 mg/dL — ABNORMAL HIGH (ref 0.44–1.00)
GFR calc Af Amer: 54 mL/min — ABNORMAL LOW (ref >60)
GFR, EST NON AFRICAN AMERICAN: 47 mL/min — AB (ref >60)
GLUCOSE: 90 mg/dL (ref 70–99)
POTASSIUM: 4.3 mmol/L (ref 3.5–5.1)
Sodium: 130 mmol/L — ABNORMAL LOW (ref 135–145)

## 2017-12-08 LAB — GLUCOSE, CAPILLARY
GLUCOSE-CAPILLARY: 121 mg/dL — AB (ref 70–99)
Glucose-Capillary: 91 mg/dL (ref 70–99)
Glucose-Capillary: 131 mg/dL — ABNORMAL HIGH (ref 70–99)

## 2017-12-08 LAB — ECHOCARDIOGRAM LIMITED
Height: 62 in
Weight: 2240 oz

## 2017-12-08 LAB — TYPE AND SCREEN
ABO/RH(D): A POS
Antibody Screen: NEGATIVE
Unit division: 0

## 2017-12-08 LAB — TRANSFUSION REACTION
DAT C3: NEGATIVE
POST RXN DAT IGG: NEGATIVE

## 2017-12-08 SURGERY — EGD (ESOPHAGOGASTRODUODENOSCOPY)
Anesthesia: Monitor Anesthesia Care

## 2017-12-08 MED ORDER — SODIUM CHLORIDE 0.9 % IV SOLN
510.0000 mg | Freq: Once | INTRAVENOUS | Status: AC
  Administered 2017-12-08: 510 mg via INTRAVENOUS
  Filled 2017-12-08: qty 17

## 2017-12-08 MED ORDER — LACTATED RINGERS IV SOLN: INTRAVENOUS | Status: DC | PRN

## 2017-12-08 MED ORDER — PROPOFOL 500 MG/50ML IV EMUL
INTRAVENOUS | Status: DC | PRN
  Administered 2017-12-08: 150 ug/kg/min via INTRAVENOUS

## 2017-12-08 MED ORDER — IOHEXOL 300 MG/ML  SOLN
100.0000 mL | Freq: Once | INTRAMUSCULAR | Status: AC | PRN
  Administered 2017-12-08: 100 mL via INTRAVENOUS

## 2017-12-08 MED ORDER — LIDOCAINE 2% (20 MG/ML) 5 ML SYRINGE
INTRAMUSCULAR | Status: DC | PRN
  Administered 2017-12-08: 20 mg via INTRAVENOUS

## 2017-12-08 NOTE — Progress Notes (Signed)
I nor nursing staff have been able to reach family or friend about EGD. Patient gave me brother Kennieth Francois' number she had written on a piece of paper in her wallet. No answer.  At this point we are going to proceed with EGD as it is necessary and we may not ever be able to reach a family member to discuss

## 2017-12-08 NOTE — Progress Notes (Signed)
PROGRESS NOTE  Melissa [name]  QMG:[id number] DOB: Apr 30, 1946 DOA: 12/07/2017 PCP: Patient, No Pcp Per   Brief Narrative: Melissa [name] is a 72 y.o. female with a history of CVA, HTN, HLD, GBS, T2DM, COPD, chronic CHF, PUD, and dementia who presented to the [facility] ED with a few days of weakness, fatigue, exertional dyspnea, and hematemesis, found to have hemoglobin down to 4.8g/dl, was given 2u PRBCs, lasix, PPI, and zofran, transferred to [facility] ED with GI, Dr. Lyndel Safe, consulted. Hgb found to be improved to 7.1g/dl, given an additional unit with stable improvement to 7.8g/dl and no further emesis/hematemesis. EGD 8/6 showed Mallory-Weiss tear, hiatal hernia with cameron ulcer. PPI was recommended to be continued. Iron stores were found to be critically depleted, so IV iron was given and GI has raised the issue of need for colonoscopy, though the patient prefers to defer this to the outpatient setting and no family can be reached for nonurgent consent.   Assessment & Plan: Principal Problem:   Hematemesis Active Problems:   Acute blood loss anemia   Dementia   CHF (congestive heart failure) (HCC)   COPD (chronic obstructive pulmonary disease) (HCC)   History of completed stroke   Unspecified atrial fibrillation (HCC)   CKD (chronic kidney disease), stage III (HCC)   Chronic bilateral low back pain without sciatica   Coronary artery disease involving native coronary artery of native heart without angina pectoris   Depression   GAD (generalized anxiety disorder)   Gastroesophageal reflux disease without esophagitis   Insomnia   Mixed hyperlipidemia   History of diet-controlled diabetes  Hematemesis due to Mallory-Weiss tear in setting of N/V:  - Continue PPI - Diet per GI - No PUD noted, though has a history of this and recommendation is to avoid NSAIDs.  Acute blood loss anemia on chronic iron deficiency anemia:  - s/p 3u PRBCs, hgb stable at 7.8g/dl. Will check in AM.  -  Ferritin is 6, will give IV iron and likely to start po iron outside scope of acute bleeding - Concerning for chronic blood loss, so colonoscopy recommended though no family can be reached for nonurgent consent and so this is likely to be deferred to the outpatient setting if hgb remains stable. CT abdomen ordered by GI.  Chronic HFrEF: EF 40-45% with septal, inferobasal hypokinesis. Pt has no chest pain and troponin is negative so do not believe this is an acute wall motion abnormality. Appears clinically compensated.  - Continue home meds  Pericardial effusion: Unclear significance. Initially felt to have pretamponade features though HR was normal and BP was high-normal, no JVD not consistent with hemodynamic significance.  - Discussed with Dr. Meda Coffee who reviewed echocardiogram and recommended repeating. This is ordered.  AFib: Thought to have a history of this though not on anticoagulation due to comorbidities. Rate is normal. TSH normal.  - Monitor on telemetry  HTN: Stable, continue home medications  Hyperlipidemia: Stable. Continue home medications  COPD: No exacerbation.  - Continue nebs, O2 prn  T2DM: HbA1c 6.3% indicating likely over control.  - SSI  History of CVA:  - PT/OT  AKI on CKD stage III - Monitor BMP  DVT prophylaxis: SCD Code Status: Ful Family Communication: None able to be contacted Disposition Plan: Uncertain  Consultants:   GO  Procedures:   EGD 8/6:  Findings:      One benign-appearing, intrinsic moderate (circumferential scarring or       stenosis; an endoscope may pass) stenosis was  found 36 cm from the       incisors. This stenosis measured 1.4 cm (inner diameter). The stenosis       was traversed.      A 10 mm non-bleeding Mallory-Weiss tear with no stigmata of recent       bleeding was found.      A medium-sized hiatal hernia was present with 2 non-bleeding Cameron       Erosions. Biopsies were taken with a cold forceps for histology to  r/o       HP. Small 1 cm submucosal incidental nonbleeding lesion was noted in the       antrum.      The duodenum was normal. Estimated blood loss was minimal. Impression:               - Benign-appearing esophageal stenosis.                           - UGI bleed likely due to Mallory-Weiss tear (no                            active bleeding currenty).                           - Medium-sized hiatal hernia with Cameron erosions. Moderate Sedation:      none Recommendation:           - Return patient to hospital ward for ongoing care.                           - Resume previous diet.                           - Continue Protonix 40 mg by mouth once a day.                            Avoid nonsteroidals.                           - Await pathology results.                           - Return to GI clinic in 4 weeks. Recommend                            colonoscopy as an outpatient as patient has                            microcytic anemia. Not keen to get colonoscopy.    Antimicrobials:  None   Subjective: No further nausea, vomiting. Denies any noted bleeding. No chest pain, abd pain, or dyspnea.   Objective: Vitals:   12/08/17 1130 12/08/17 1201 12/08/17 1707 12/08/17 1818  BP: (!) 145/79 (!) 142/85 (!) 145/73 (!) 141/83  Pulse: 78 63 75 79  Resp: (!) 23 20 18 18   Temp:  98.5 F (36.9 C) 98.7 F (37.1 C) 98.7 F (37.1 C)  TempSrc:  Oral Oral Oral  SpO2: 99% 98% 100% 98%  Weight:      Height:  Intake/Output Summary (Last 24 hours) at 12/08/2017 1837 Last data filed at 12/08/2017 1600 Gross per 24 hour  Intake 2879.24 ml  Output -  Net 2879.24 ml   Filed Weights   12/07/17 0358  Weight: 63.5 kg (140 lb)    Gen: 71 y.o. female in no distress  Pulm: Non-labored breathing. Clear to auscultation bilaterally.  CV: Regular rate and rhythm. No murmur, NO rub, or gallop. No JVD, trace pedal edema. GI: Abdomen soft, non-tender, non-distended, with normoactive bowel  sounds. No organomegaly or masses felt. Ext: Warm, no deformities Skin: No rashes, lesions or ulcers Neuro: Alert and oriented. No focal neurological deficits. Psych: Judgement and insight appear impaired. Mood & affect appropriate.   Data Reviewed: I have personally reviewed following labs and imaging studies  CBC: Recent Labs  Lab 12/07/17 0411 12/07/17 0442 12/07/17 1413 12/07/17 2008 12/08/17 0136 12/08/17 0731  WBC 4.3  --  6.3 7.1 7.0 6.2  NEUTROABS 2.9  --   --   --   --   --   HGB 7.1* 8.5* 7.8* 7.8* 7.8* 8.3*  HCT 24.2* 25.0* 24.9* 25.4* 26.2* 27.6*  MCV 65.8*  --  65.9* 67.4* 68.8* 68.7*  PLT 532*  --  509* 458* 467* 423*   Basic Metabolic Panel: Recent Labs  Lab 12/07/17 0411 12/07/17 0442 12/08/17 0136  NA 134* 133* 130*  K 3.8 3.7 4.3  CL 100 97* 97*  CO2 22  --  20*  GLUCOSE 112* 110* 90  BUN 10 11 9   CREATININE 1.12* 1.10* 1.13*  CALCIUM 9.2  --  8.7*   GFR: Estimated Creatinine Clearance: 38.8 mL/min (A) (by C-G formula based on SCr of 1.13 mg/dL (H)). Liver Function Tests: Recent Labs  Lab 12/07/17 0411 12/07/17 0916  AST 40  --   ALT 44  --   ALKPHOS 102  --   BILITOT 2.5* 3.8*  PROT 6.6  --   ALBUMIN 3.7  --    Recent Labs  Lab 12/07/17 0411  LIPASE 43   No results for input(s): AMMONIA in the last 168 hours. Coagulation Profile: Recent Labs  Lab 12/07/17 0411  INR 1.23   Cardiac Enzymes: Recent Labs  Lab 12/07/17 0916 12/07/17 1413  TROPONINI <0.03 <0.03   BNP (last 3 results) No results for input(s): PROBNP in the last 8760 hours. HbA1C: Recent Labs    12/07/17 0916  HGBA1C 6.3*   CBG: Recent Labs  Lab 12/07/17 1306 12/07/17 1730 12/08/17 1202 12/08/17 1720  GLUCAP 125* 101* 91 131*   Lipid Profile: No results for input(s): CHOL, HDL, LDLCALC, TRIG, CHOLHDL, LDLDIRECT in the last 72 hours. Thyroid Function Tests: Recent Labs    12/07/17 0916  TSH 2.342  FREET4 1.92*   Anemia Panel: Recent Labs     12/07/17 0916 12/07/17 1413  VITAMINB12 610  --   FOLATE 13.1  --   FERRITIN 6*  --   TIBC 496*  --   IRON 74  --   RETICCTPCT  --  0.6   Urine analysis: No results found for: COLORURINE, APPEARANCEUR, LABSPEC, PHURINE, GLUCOSEU, HGBUR, BILIRUBINUR, KETONESUR, PROTEINUR, UROBILINOGEN, NITRITE, LEUKOCYTESUR No results found for this or any previous visit (from the past 240 hour(s)).    Radiology Studies: Dg Chest Port 1 View  Result Date: 12/07/2017 CLINICAL DATA:  Acute onset of GI bleeding. EXAM: PORTABLE CHEST 1 VIEW COMPARISON:  Chest radiograph performed 12/06/2017 FINDINGS: The lungs are well-aerated. Right basilar airspace opacity may reflect  atelectasis or possibly mild infection. Mild vascular congestion is noted. No pleural effusion or pneumothorax is seen. The cardiomediastinal silhouette is enlarged. No acute osseous abnormalities are seen. IMPRESSION: Right basilar airspace opacity may reflect atelectasis or possibly mild infection. Mild vascular congestion and cardiomegaly noted. Electronically Signed   By: Garald Balding M.D.   On: 12/07/2017 04:32   Dg Abd Portable 2 Views  Result Date: 12/07/2017 CLINICAL DATA:  72 y/o  F; GI bleed. EXAM: PORTABLE ABDOMEN - 2 VIEW COMPARISON:  06/22/2016 abdomen radiographs. 12/07/2017 chest radiograph. FINDINGS: Cardiomegaly. Right pleural effusion is stable. Aortic atherosclerosis. Hiatal hernia. Normal bowel gas pattern. Lower abdomen and pelvis are excluded from the field of view. Right upper quadrant surgical clips, presumably cholecystectomy. IMPRESSION: Normal bowel gas pattern. Lower abdomen and pelvis excluded from field of view. Stable right pleural effusion, cardiomegaly, aortic atherosclerosis. Hiatal hernia. Electronically Signed   By: Kristine Garbe M.D.   On: 12/07/2017 04:32    Scheduled Meds: . insulin aspart  0-9 Units Subcutaneous TID WC   Continuous Infusions: . sodium chloride 75 mL/hr at 12/07/17 0828  .  pantoprozole (PROTONIX) infusion 8 mg/hr (12/08/17 0645)     LOS: 1 day   Time spent: 25 minutes.  Patrecia Pour, MD [facility] [email or web address] Password [facility] 12/08/2017, 6:37 PM

## 2017-12-08 NOTE — Op Note (Signed)
[facility] Patient Name: Melissa [name] Procedure Date : 12/08/2017 MRN: [id number] Attending MD: Jackquline Denmark , MD Date of Birth: [birth year] CSN: [id number] Age: 72 Admit Type: Inpatient Procedure:                Upper GI endoscopy Indications:              Hematemesis Providers:                Jackquline Denmark, MD, [name] Plan, RN, Cherylynn Ridges, Technician, Judeth Cornfield, CRNA Referring MD:              Medicines:                Monitored Anesthesia Care Complications:            No immediate complications. Estimated Blood Loss:     Estimated blood loss was minimal. Procedure:                Pre-Anesthesia Assessment:                           - Prior to the procedure, a History and Physical                            was performed, and patient medications and                            allergies were reviewed. The patient's tolerance of                            previous anesthesia was also reviewed. The risks                            and benefits of the procedure and the sedation                            options and risks were discussed with the patient.                            All questions were answered, and informed consent                            was obtained. Prior Anticoagulants: The patient has                            taken no previous anticoagulant or antiplatelet                            agents. ASA Grade Assessment: III - A patient with                            severe systemic disease. After reviewing the risks  and benefits, the patient was deemed in                            satisfactory condition to undergo the procedure.                           After obtaining informed consent, the endoscope was                            passed under direct vision. Throughout the                            procedure, the patient's blood pressure, pulse, and                            oxygen  saturations were monitored continuously. The                            GIF-H190 ([id number]) Olympus Adult EGD was introduced                            through the mouth, and advanced to the second part                            of duodenum. The upper GI endoscopy was                            accomplished without difficulty. The patient                            tolerated the procedure well. Scope In: Scope Out: Findings:      One benign-appearing, intrinsic moderate (circumferential scarring or       stenosis; an endoscope may pass) stenosis was found 36 cm from the       incisors. This stenosis measured 1.4 cm (inner diameter). The stenosis       was traversed.      A 10 mm non-bleeding Mallory-Weiss tear with no stigmata of recent       bleeding was found.      A medium-sized hiatal hernia was present with 2 non-bleeding Cameron       Erosions. Biopsies were taken with a cold forceps for histology to r/o       HP. Small 1 cm submucosal incidental nonbleeding lesion was noted in the       antrum.      The duodenum was normal. Estimated blood loss was minimal. Impression:               - Benign-appearing esophageal stenosis.                           - UGI bleed likely due to Mallory-Weiss tear (no                            active bleeding currenty).                           -  Medium-sized hiatal hernia with Lysbeth Galas erosions. Moderate Sedation:      none Recommendation:           - Return patient to hospital ward for ongoing care.                           - Resume previous diet.                           - Continue Protonix 40 mg by mouth once a day.                            Avoid nonsteroidals.                           - Await pathology results.                           - Return to GI clinic in 4 weeks. Recommend                            colonoscopy as an outpatient as patient has                            microcytic anemia. Not keen to get colonoscopy. Procedure  Code(s):        --- Professional ---                           [phone], Esophagogastroduodenoscopy, flexible,                            transoral; with biopsy, single or multiple Diagnosis Code(s):        --- Professional ---                           K22.2, Esophageal obstruction                           K22.6, Gastro-esophageal laceration-hemorrhage                            syndrome                           K44.9, Diaphragmatic hernia without obstruction or                            gangrene                           K92.2, Gastrointestinal hemorrhage, unspecified CPT copyright 2017 American Medical Association. All rights reserved. The codes documented in this report are preliminary and upon coder review may  be revised to meet current compliance requirements. Jackquline Denmark, MD 12/08/2017 11:26:10 AM This report has been signed electronically. Number of Addenda: 0

## 2017-12-08 NOTE — Progress Notes (Signed)
Per Dr. Lyndel Safe, patient to sign her own consent for EGD today. Patient is alert and oriented x 4. She can not recall the exact day but knows it is August 2019. Consent signed by patient and witnessed by Probation officer.

## 2017-12-08 NOTE — Transfer of Care (Signed)
Immediate Anesthesia Transfer of Care Note  Patient: Melissa [name]  Procedure(s) Performed: ESOPHAGOGASTRODUODENOSCOPY (EGD) (N/A ) BIOPSY  Patient Location: PACU and Endoscopy Unit  Anesthesia Type:MAC  Level of Consciousness: patient cooperative and responds to stimulation  Airway & Oxygen Therapy: Patient Spontanous Breathing and Patient connected to nasal cannula oxygen  Post-op Assessment: Report given to RN and Post -op Vital signs reviewed and stable  Post vital signs: Reviewed and stable  Last Vitals:  Vitals Value Taken Time  BP 156/66 12/08/2017 11:19 AM  Temp    Pulse 79 12/08/2017 11:19 AM  Resp 18 12/08/2017 11:19 AM  SpO2 97 % 12/08/2017 11:19 AM  Vitals shown include unvalidated device data.  Last Pain:  Vitals:   12/08/17 1027  TempSrc: Oral  PainSc: 0-No pain      Patients Stated Pain Goal: 0 (43/56/86 1683)  Complications: No apparent anesthesia complications

## 2017-12-08 NOTE — Interval H&P Note (Signed)
History and Physical Interval Note:  12/08/2017 10:42 AM  Melissa [name]  has presented today for surgery, with the diagnosis of GI bleed, anemia  The various methods of treatment have been discussed with the patient and family. After consideration of risks, benefits and other options for treatment, the patient has consented to  Procedure(s): ESOPHAGOGASTRODUODENOSCOPY (EGD) (N/A) as a surgical intervention .  The patient's history has been reviewed, patient examined, no change in status, stable for surgery.  I have reviewed the patient's chart and labs.  Questions were answered to the patient's satisfaction.     Jackquline Denmark

## 2017-12-08 NOTE — Anesthesia Preprocedure Evaluation (Signed)
Anesthesia Evaluation  Patient identified by MRN, date of birth, ID band Patient awake    Reviewed: Allergy & Precautions, NPO status , Patient's Chart, lab work & pertinent test results  Airway Mallampati: II  TM Distance: >3 FB Neck ROM: Full    Dental no notable dental hx.    Pulmonary asthma , COPD,    Pulmonary exam normal breath sounds clear to auscultation       Cardiovascular hypertension, + CAD and +CHF  Normal cardiovascular exam Rhythm:Regular Rate:Normal     Neuro/Psych PSYCHIATRIC DISORDERS Anxiety Depression Dementia  Neuromuscular disease CVA    GI/Hepatic Neg liver ROS, PUD, GERD  ,  Endo/Other  negative endocrine ROSdiabetes  Renal/GU Renal disease  negative genitourinary   Musculoskeletal negative musculoskeletal ROS (+)   Abdominal   Peds negative pediatric ROS (+)  Hematology negative hematology ROS (+) anemia ,   Anesthesia Other Findings   Reproductive/Obstetrics negative OB ROS                            Anesthesia Physical Anesthesia Plan  ASA: III  Anesthesia Plan: MAC   Post-op Pain Management:    Induction: Intravenous  PONV Risk Score and Plan: 2 and Ondansetron, Propofol infusion and TIVA  Airway Management Planned: Simple Face Mask  Additional Equipment:   Intra-op Plan:   Post-operative Plan:   Informed Consent: I have reviewed the patients History and Physical, chart, labs and discussed the procedure including the risks, benefits and alternatives for the proposed anesthesia with the patient or authorized representative who has indicated his/her understanding and acceptance.   Dental advisory given  Plan Discussed with: CRNA  Anesthesia Plan Comments:         Anesthesia Quick Evaluation

## 2017-12-08 NOTE — Progress Notes (Signed)
Pt requesting something to eat, currently NPO. Text page K.Kirby,NP with no new order.

## 2017-12-09 DIAGNOSIS — Z8639 Personal history of other endocrine, nutritional and metabolic disease: Secondary | ICD-10-CM

## 2017-12-09 DIAGNOSIS — D649 Anemia, unspecified: Secondary | ICD-10-CM

## 2017-12-09 DIAGNOSIS — E782 Mixed hyperlipidemia: Secondary | ICD-10-CM

## 2017-12-09 DIAGNOSIS — I482 Chronic atrial fibrillation: Secondary | ICD-10-CM

## 2017-12-09 DIAGNOSIS — J449 Chronic obstructive pulmonary disease, unspecified: Secondary | ICD-10-CM

## 2017-12-09 DIAGNOSIS — K219 Gastro-esophageal reflux disease without esophagitis: Secondary | ICD-10-CM

## 2017-12-09 DIAGNOSIS — I2510 Atherosclerotic heart disease of native coronary artery without angina pectoris: Secondary | ICD-10-CM

## 2017-12-09 LAB — BASIC METABOLIC PANEL
Anion gap: 10 (ref 5–15)
BUN: 7 mg/dL — AB (ref 8–23)
CHLORIDE: 105 mmol/L (ref 98–111)
CO2: 19 mmol/L — AB (ref 22–32)
Calcium: 8.7 mg/dL — ABNORMAL LOW (ref 8.9–10.3)
Creatinine, Ser: 1.09 mg/dL — ABNORMAL HIGH (ref 0.44–1.00)
GFR calc Af Amer: 57 mL/min — ABNORMAL LOW (ref >60)
GFR calc non Af Amer: 49 mL/min — ABNORMAL LOW (ref >60)
GLUCOSE: 88 mg/dL (ref 70–99)
POTASSIUM: 3.8 mmol/L (ref 3.5–5.1)
SODIUM: 134 mmol/L — AB (ref 135–145)

## 2017-12-09 LAB — CBC
HEMATOCRIT: 26.6 % — AB (ref 36.0–46.0)
HEMOGLOBIN: 7.8 g/dL — AB (ref 12.0–15.0)
MCH: 20.5 pg — AB (ref 26.0–34.0)
MCHC: 29.3 g/dL — AB (ref 30.0–36.0)
MCV: 70 fL — AB (ref 78.0–100.0)
Platelets: 468 10*3/uL — ABNORMAL HIGH (ref 150–400)
RBC: 3.8 MIL/uL — AB (ref 3.87–5.11)
RDW: 30.1 % — ABNORMAL HIGH (ref 11.5–15.5)
WBC: 6.3 10*3/uL (ref 4.0–10.5)

## 2017-12-09 LAB — GLUCOSE, CAPILLARY
GLUCOSE-CAPILLARY: 79 mg/dL (ref 70–99)
Glucose-Capillary: 149 mg/dL — ABNORMAL HIGH (ref 70–99)

## 2017-12-09 MED ORDER — PANTOPRAZOLE SODIUM 40 MG PO TBEC: 40.0000 mg | DELAYED_RELEASE_TABLET | Freq: Two times a day (BID) | ORAL | 11 refills | Status: DC

## 2017-12-09 MED ORDER — FERROUS SULFATE 325 (65 FE) MG PO TABS: 325.0000 mg | ORAL_TABLET | Freq: Every day | ORAL | 11 refills | Status: DC

## 2017-12-09 NOTE — Progress Notes (Signed)
Dr. Steve Rattler office RN will contact patient early next week to arrange for follow up to discuss colonoscopy. She may opt to follow up with Dr. Melina Copa who she saw a few years ago and that will be fine as well.

## 2017-12-09 NOTE — Anesthesia Postprocedure Evaluation (Signed)
Anesthesia Post Note  Patient: Melissa [name]  Procedure(s) Performed: ESOPHAGOGASTRODUODENOSCOPY (EGD) (N/A ) BIOPSY     Patient location during evaluation: PACU Anesthesia Type: MAC Level of consciousness: awake and alert Pain management: pain level controlled Vital Signs Assessment: post-procedure vital signs reviewed and stable Respiratory status: spontaneous breathing Cardiovascular status: stable Anesthetic complications: no    Last Vitals:  Vitals:   12/09/17 0125 12/09/17 0416  BP: (!) 169/81 (!) 159/76  Pulse: 78 (!) 58  Resp: (!) 22 20  Temp: 37.1 C 36.9 C  SpO2: 96% 96%    Last Pain:  Vitals:   12/09/17 0416  TempSrc: Oral  PainSc:                  Nolon Nations

## 2017-12-09 NOTE — Progress Notes (Signed)
Pt is discharged to go home.  Discharge instructions and appointment and prescription information given.

## 2017-12-09 NOTE — Discharge Summary (Signed)
Physician Discharge Summary  Melissa [name] SNK:[id number] DOB: 16-May-1945 DOA: 12/07/2017  PCP: Patient, No Pcp Per  Admit date: 12/07/2017 Discharge date: 12/09/2017  Admitted From: Home by way of [facility] Disposition: Home   Recommendations for Outpatient Follow-up:  1. Follow up with PCP in 1-2 weeks 2. Please obtain BMP/CBC in one week 3. Follow up echocardiogram in 1 month to reassess pericardial effusion. 4. Follow up with GI to discuss colonoscopy.  Home Health: None new Equipment/Devices: None new Discharge Condition: Stable CODE STATUS: Full Diet recommendation: Heart healthy-carb modified  Brief/Interim Summary: Melissa [name] is a 72 y.o. female with a history of CVA, HTN, HLD, GBS, T2DM, COPD, chronic CHF, PUD, and dementia who presented to the [facility] ED with a few days of weakness, fatigue, exertional dyspnea, and hematemesis, found to have hemoglobin down to 4.8g/dl, was given 2u PRBCs, lasix, PPI, and zofran, transferred to [facility] ED with GI, Dr. Lyndel Safe, consulted. Hgb found to be improved to 7.1g/dl, given an additional unit with stable improvement to 7.8g/dl and no further emesis/hematemesis. EGD 8/6 showed Mallory-Weiss tear, hiatal hernia with cameron ulcer. PPI was recommended to be continued. Iron stores were found to be critically depleted, so IV iron was given and GI has raised the issue of need for colonoscopy, though the patient prefers to defer this to the outpatient setting.  Discharge Diagnoses:  Principal Problem:   Hematemesis Active Problems:   Acute blood loss anemia   Dementia   CHF (congestive heart failure) (HCC)   COPD (chronic obstructive pulmonary disease) (HCC)   History of completed stroke   Unspecified atrial fibrillation (HCC)   CKD (chronic kidney disease), stage III (HCC)   Chronic bilateral low back pain without sciatica   Coronary artery disease involving native coronary artery of native heart without angina pectoris    Depression   GAD (generalized anxiety disorder)   Gastroesophageal reflux disease without esophagitis   Insomnia   Mixed hyperlipidemia   History of diet-controlled diabetes  Hematemesis due to Mallory-Weiss tear in setting of N/V: Resolved since admission. - Continue PPI - Tolerating diet - No PUD noted, though has a history of this and recommendation is to avoid NSAIDs.  Acute blood loss anemia on chronic iron deficiency anemia:  - s/p 3u PRBCs, hgb stable at 7.8g/dl. - Ferritin is 6, so given IV iron and will start po iron. - Concerning for chronic blood loss, so CT abdomen ordered which revealed no explanation for blood loss. Colonoscopy recommended, deferred to the outpatient setting.  Thrombocytosis: Likely due to iron deficiency.  - Monitor at follow up  Chronic HFrEF: EF 40-45% with septal, inferobasal hypokinesis. Pt has no chest pain and troponin is negative so do not believe this is an acute wall motion abnormality. Appears clinically compensated.  - Continue home meds  Pericardial effusion: Unclear significance. Initially felt to have pretamponade features though HR was normal and BP was high-normal, no JVD not consistent with hemodynamic significance. Repeat limited echo on 8/6 demonstrated no right heart invagination, overall decrease in size. Discussed with patient who reports this is not new. Discussed with cardiology, Dr. Meda Coffee, who reviewed images and recommends repeat echo in 1 month.  AFib: Thought to have a history of this though not on anticoagulation due to comorbidities. Rate is normal. TSH normal.   HTN: Stable, continue home medications  Hyperlipidemia: Stable. Continue home medications  COPD: No exacerbation.  - Continue nebs, O2 prn  T2DM: HbA1c 6.3%. Continue home medications.  History of CVA: No medication changes were made.  CKD stage III: Stable throughout admission most consistent with no AKI.   Discharge Instructions Discharge  Instructions    Diet - low sodium heart healthy   Complete by:  As directed    Discharge instructions   Complete by:  As directed    You were admitted for hematemesis (vomiting blood) which was due to a tear in the junction between the esophagus and stomach. This has stopped bleeding and you have tolerated a diet. You were given transfusions of blood and your blood count is stable so you are clear for discharge with the following recommendations:  - Follow up with your PCP in the next 1-2 weeks for repeat labs - START taking protonix twice daily and iron (ferrous sulfate) daily to help heal the tear and replace your iron stores so you can make more blood cells. - Follow up with your cardiologist in about 1 month to discuss the fluid around your heart. You would likely need a repeat echocardiogram (heart ultrasound) in about 1 month.  - Follow up with GI to discuss colonoscopy.  - Seek medical attention right away if your symptoms return.   Increase activity slowly   Complete by:  As directed      Allergies as of 12/09/2017      Reactions   Codeine Nausea And Vomiting   Lyrica [pregabalin] Other (See Comments)   unknown   Penicillins Other (See Comments)   Childhood allergy       Medication List    TAKE these medications   ferrous sulfate 325 (65 FE) MG tablet Take 1 tablet (325 mg total) by mouth daily.   pantoprazole 40 MG tablet Commonly known as:  PROTONIX Take 1 tablet (40 mg total) by mouth 2 (two) times daily.       Allergies  Allergen Reactions  . Codeine Nausea And Vomiting  . Lyrica [Pregabalin] Other (See Comments)    unknown  . Penicillins Other (See Comments)    Childhood allergy     Consultations:  GI  Cardiology  Procedures/Studies: Ct Abdomen Pelvis W Contrast  Result Date: 12/08/2017 CLINICAL DATA:  Gastrointestinal bleeding EXAM: CT ABDOMEN AND PELVIS WITH CONTRAST TECHNIQUE: Multidetector CT imaging of the abdomen and pelvis was performed using  the standard protocol following bolus administration of intravenous contrast. CONTRAST:  166mL OMNIPAQUE IOHEXOL 300 MG/ML  SOLN COMPARISON:  11/29/2013 FINDINGS: Lower chest: Cardiomegaly with coronary arteriosclerosis. Tra pericardial effusion measuring up to 1.9 cm along the right lateral heart border and 1.6 cm posterolaterally on the left are noted. Moderate right effusion with compressive atelectasis is seen at the right lung base. Trace left effusion is present as well. Hepatobiliary: Cholecystectomy. Homogeneous enhancement of the liver without mass or biliary dilatation. Pancreas: Normal Spleen: Normal Adrenals/Urinary Tract: Normal bilateral adrenal glands. Renal cortical thinning and bilateral renal hypodensities statistically consistent with cysts, some of which are too small to further characterize are noted. No definite enhancing mass, nephrolithiasis nor hydroureteronephrosis. The urinary bladder is unremarkable for the degree of distention. Bladder is partially obscured however by the patient's right hip fixation hardware and left hip arthroplasty. Stomach/Bowel: Stomach is within normal limits. Appendix is surgically absent. No evidence of bowel wall thickening, distention, or inflammatory changes. Vascular/Lymphatic: Moderate aortoiliac and atherosclerosis without aneurysm. No lymphadenopathy by CT size criteria. Reproductive: Hysterectomy.  No adnexal mass. Other: Stigmata ventral hernia repair.  No free air nor free fluid. Musculoskeletal: Thoracolumbar spondylosis with multilevel degenerative  disc disease most pronounced at L1-2, L4-5 and L5-S1. Degenerative grade 1 anterolisthesis of L4 on L5 is noted likely on the basis of facet arthropathy. No pars defects. No suspicious osseous lesions. IMPRESSION: 1. No CT findings to explain the patient's gastrointestinal bleeding. No bowel obstruction or definite inflammation. 2. Cardiomegaly with coronary arteriosclerosis and small pericardial effusion  as above. Moderate right and trace left pleural effusions. 3. Bilateral renal hypodensities statistically consistent with cysts. No obstructive uropathy. 4. Lumbar and thoracic spondylosis. 5. Moderate aortoiliac atherosclerosis.  No aneurysm. Electronically Signed   By: Ashley Royalty M.D.   On: 12/08/2017 20:41   Dg Chest Port 1 View  Result Date: 12/07/2017 CLINICAL DATA:  Acute onset of GI bleeding. EXAM: PORTABLE CHEST 1 VIEW COMPARISON:  Chest radiograph performed 12/06/2017 FINDINGS: The lungs are well-aerated. Right basilar airspace opacity may reflect atelectasis or possibly mild infection. Mild vascular congestion is noted. No pleural effusion or pneumothorax is seen. The cardiomediastinal silhouette is enlarged. No acute osseous abnormalities are seen. IMPRESSION: Right basilar airspace opacity may reflect atelectasis or possibly mild infection. Mild vascular congestion and cardiomegaly noted. Electronically Signed   By: Garald Balding M.D.   On: 12/07/2017 04:32   Dg Abd Portable 2 Views  Result Date: 12/07/2017 CLINICAL DATA:  72 y/o  F; GI bleed. EXAM: PORTABLE ABDOMEN - 2 VIEW COMPARISON:  06/22/2016 abdomen radiographs. 12/07/2017 chest radiograph. FINDINGS: Cardiomegaly. Right pleural effusion is stable. Aortic atherosclerosis. Hiatal hernia. Normal bowel gas pattern. Lower abdomen and pelvis are excluded from the field of view. Right upper quadrant surgical clips, presumably cholecystectomy. IMPRESSION: Normal bowel gas pattern. Lower abdomen and pelvis excluded from field of view. Stable right pleural effusion, cardiomegaly, aortic atherosclerosis. Hiatal hernia. Electronically Signed   By: Kristine Garbe M.D.   On: 12/07/2017 04:32    Echocardiogram 12/07/2017: - Left ventricle: Septal and inferior basal hypokinesis. The cavity   size was mildly dilated. Wall thickness was normal. Systolic   function was mildly to moderately reduced. The estimated ejection   fraction was in the  range of 40% to 45%. The study is not   technically sufficient to allow evaluation of LV diastolic   function. - Aortic valve: Valve area (VTI): 2.66 cm^2. Valve area (Vmean):   2.35 cm^2. - Mitral valve: There was mild regurgitation. - Left atrium: The atrium was moderately dilated. - Right atrium: The atrium was mildly dilated. - Atrial septum: No defect or patent foramen ovale was identified. - Pericardium, extracardiac: Moderate to large circumferential   pericardial effusion largest on the posterior and lateral   dimension. Doppler is suboptimal in afib but RV cavity is small   and there appears to be some early diastolic collapse and IVC is   dilated suggesting pre tamponade physiology  Echocardiogram limited 12/08/2017: - Left ventricle: The cavity size was normal. Systolic function was   normal. The estimated ejection fraction was in the range of 50%   to 55%. Wall motion was normal; there were no regional wall   motion abnormalities. The study is not technically sufficient to   allow evaluation of LV diastolic function. - Pericardium, extracardiac: A moderate to large pericardial   effusion was identified circumferential to the heart (mostly   posterior). There was no evidence of hemodynamic compromise.   Effusion may be slightly smaller than prior study.  Subjective: Wants to go home. Ate without nausea, abdominal pain, vomiting. Denies dyspnea, lightheadedness, chest pain, leg swelling.  Discharge Exam: Vitals:  12/09/17 0416 12/09/17 1114  BP: (!) 159/76 (!) 151/84  Pulse: (!) 58 89  Resp: 20 18  Temp: 98.5 F (36.9 C) 98.6 F (37 C)  SpO2: 96% 97%   General: Pt is alert, awake, not in acute distress Cardiovascular: RRR, S1/S2 +, no rubs, no gallops Respiratory: CTA bilaterally, no wheezing, no rhonchi Abdominal: Soft, NT, ND, bowel sounds + Extremities: No edema, no cyanosis  Labs: BNP (last 3 results) No results for input(s): BNP in the last 8760  hours. Basic Metabolic Panel: Recent Labs  Lab 12/07/17 0411 12/07/17 0442 12/08/17 0136 12/09/17 0531  NA 134* 133* 130* 134*  K 3.8 3.7 4.3 3.8  CL 100 97* 97* 105  CO2 22  --  20* 19*  GLUCOSE 112* 110* 90 88  BUN 10 11 9  7*  CREATININE 1.12* 1.10* 1.13* 1.09*  CALCIUM 9.2  --  8.7* 8.7*   Liver Function Tests: Recent Labs  Lab 12/07/17 0411 12/07/17 0916  AST 40  --   ALT 44  --   ALKPHOS 102  --   BILITOT 2.5* 3.8*  PROT 6.6  --   ALBUMIN 3.7  --    Recent Labs  Lab 12/07/17 0411  LIPASE 43   No results for input(s): AMMONIA in the last 168 hours. CBC: Recent Labs  Lab 12/07/17 0411  12/07/17 1413 12/07/17 2008 12/08/17 0136 12/08/17 0731 12/09/17 0531  WBC 4.3  --  6.3 7.1 7.0 6.2 6.3  NEUTROABS 2.9  --   --   --   --   --   --   HGB 7.1*   < > 7.8* 7.8* 7.8* 8.3* 7.8*  HCT 24.2*   < > 24.9* 25.4* 26.2* 27.6* 26.6*  MCV 65.8*  --  65.9* 67.4* 68.8* 68.7* 70.0*  PLT 532*  --  509* 458* 467* 515* 468*   < > = values in this interval not displayed.   Cardiac Enzymes: Recent Labs  Lab 12/07/17 0916 12/07/17 1413  TROPONINI <0.03 <0.03   BNP: Invalid input(s): POCBNP CBG: Recent Labs  Lab 12/08/17 1202 12/08/17 1720 12/08/17 2149 12/09/17 0753 12/09/17 1149  GLUCAP 91 131* 121* 79 149*   D-Dimer No results for input(s): DDIMER in the last 72 hours. Hgb A1c Recent Labs    12/07/17 0916  HGBA1C 6.3*   Lipid Profile No results for input(s): CHOL, HDL, LDLCALC, TRIG, CHOLHDL, LDLDIRECT in the last 72 hours. Thyroid function studies Recent Labs    12/07/17 0916  TSH 2.342   Anemia work up Recent Labs    12/07/17 0916 12/07/17 1413  VITAMINB12 610  --   FOLATE 13.1  --   FERRITIN 6*  --   TIBC 496*  --   IRON 74  --   RETICCTPCT  --  0.6   Urinalysis No results found for: COLORURINE, APPEARANCEUR, LABSPEC, PHURINE, GLUCOSEU, HGBUR, BILIRUBINUR, KETONESUR, PROTEINUR, UROBILINOGEN, NITRITE, LEUKOCYTESUR  Microbiology No  results found for this or any previous visit (from the past 240 hour(s)).  Time coordinating discharge: Approximately 40 minutes  Patrecia Pour, MD  [facility] 12/09/2017, 12:52 PM Pager [phone]

## 2017-12-15 ENCOUNTER — Telehealth: Payer: Self-pay

## 2017-12-15 NOTE — Telephone Encounter (Signed)
Her phone is not accepting voicemail at this time.

## 2017-12-16 ENCOUNTER — Telehealth: Payer: Self-pay

## 2017-12-16 ENCOUNTER — Encounter: Payer: Self-pay | Admitting: Gastroenterology

## 2017-12-16 NOTE — Telephone Encounter (Signed)
-----   Message from Debbora Lacrosse, RN sent at 12/09/2017  1:44 PM EDT -----   ----- Message ----- From: Willia Craze, NP Sent: 12/09/2017   1:22 PM To: Jackquline Denmark, MD, Debbora Lacrosse, RN  Olin Hauser, patient to be discharged from hospital after upper GI bleed. She needs GI follow up to continue evaluation for iron deficiency anemia  Please call her early next week:   1. Does she want to follow up with Dr. Melina Copa in [place] who she saw years ago?  If so then that is fine.   2. If wants to see [place] then she will be Dr. Steve Rattler patient but I doubt he has any office spots open anytime soon so can put her in with me for an interim follow up. Thanks

## 2017-12-16 NOTE — Telephone Encounter (Signed)
I have tried to reach her X 2 but her phone rings busy.  I did reach out to her brother Wille Glaser to see if he has an alternate phone number.

## 2017-12-18 ENCOUNTER — Telehealth: Payer: Self-pay

## 2017-12-18 NOTE — Telephone Encounter (Signed)
Phone continues to ring busy.

## 2017-12-31 ENCOUNTER — Encounter: Payer: Self-pay | Admitting: Gastroenterology

## 2018-01-13 ENCOUNTER — Ambulatory Visit: Payer: Medicare Other | Admitting: Gastroenterology

## 2018-09-18 IMAGING — CT CT ABD-PELV W/ CM
2 of 5 series · 15 of 46 positions shown, 17 images · IV contrast (APPLIED)
Comparison: 11/29/2013

CLINICAL DATA: Gastrointestinal bleeding

EXAM:
CT ABDOMEN AND PELVIS WITH CONTRAST
TECHNIQUE: Multidetector CT imaging of the abdomen and pelvis was performed
using the standard protocol following bolus administration of
intravenous contrast.
CONTRAST:  100mL OMNIPAQUE IOHEXOL 300 MG/ML  SOLN

[Series 3: abd/ pelvis 5.0 i30f 2 · axial · 0.72mm/px · z∈[+772,+1167]mm · 12 of 89 slices shown, 14 images]
[im 5/89  soft-tissue]
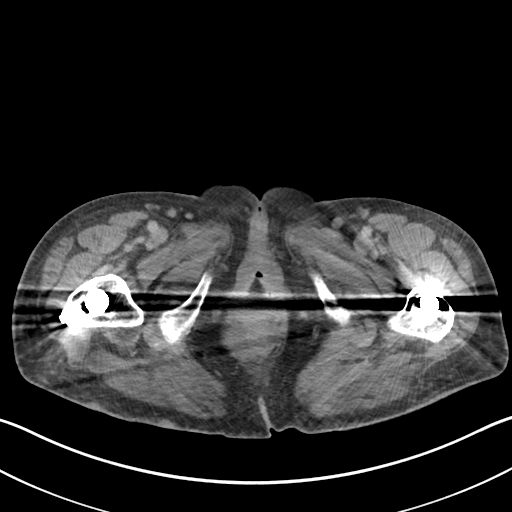
[im 5/89  bone]
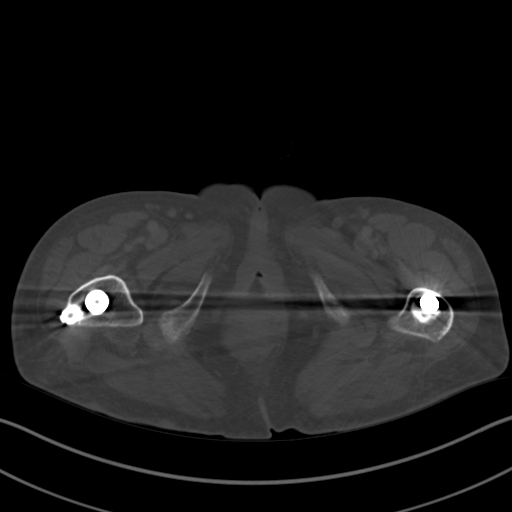
[im 14/89  soft-tissue]
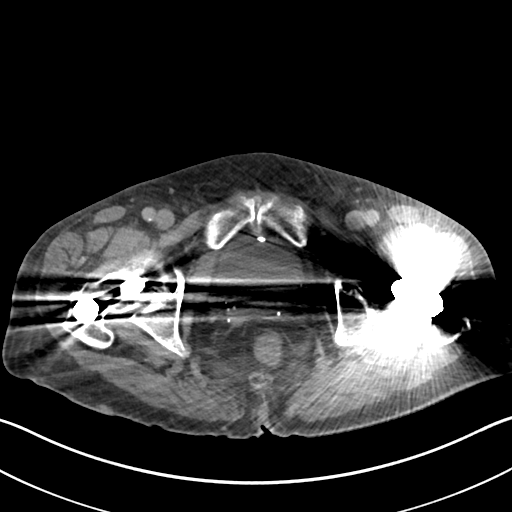
[im 19/89  soft-tissue]
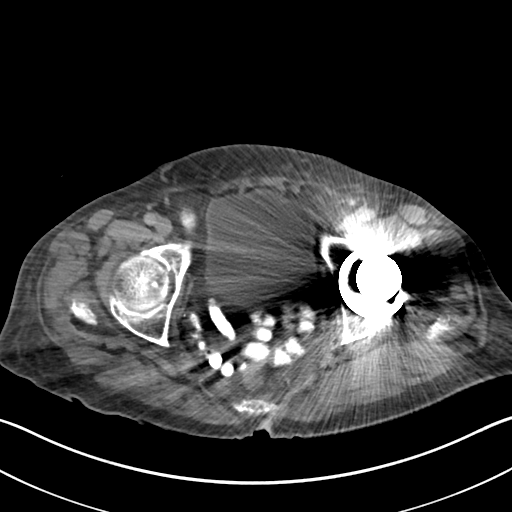
[im 28/89  soft-tissue]
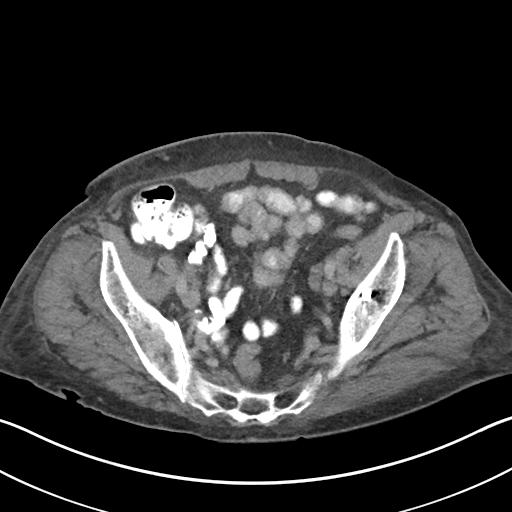
[im 33/89  soft-tissue]
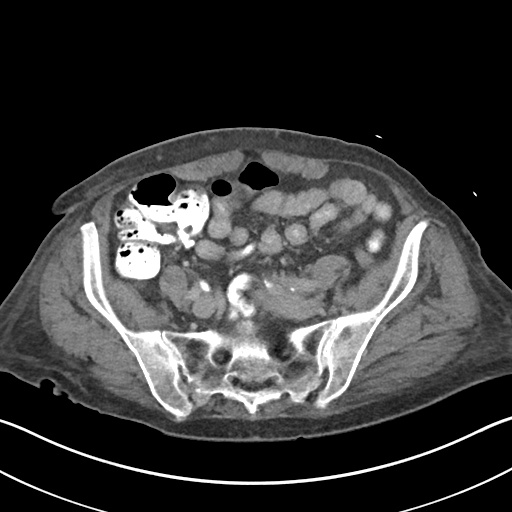
[im 42/89  soft-tissue]
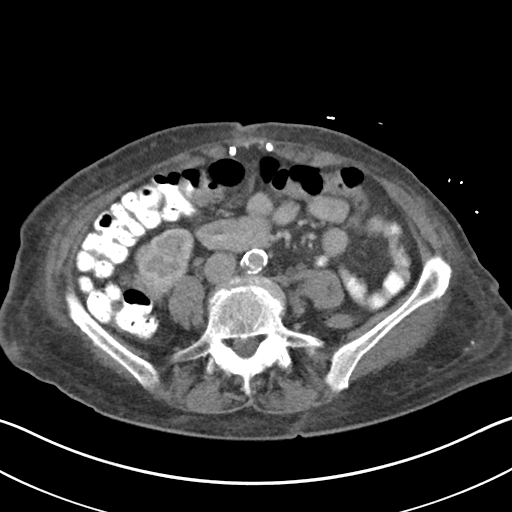
[im 47/89  soft-tissue]
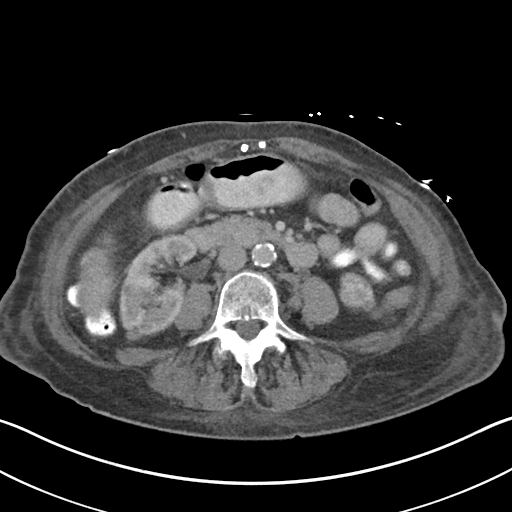
[im 56/89  soft-tissue]
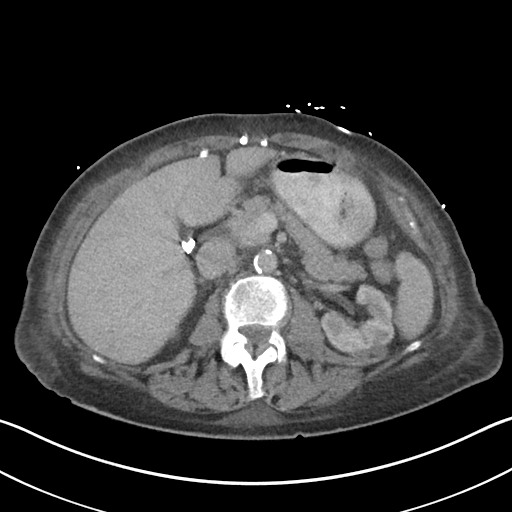
[im 61/89  soft-tissue]
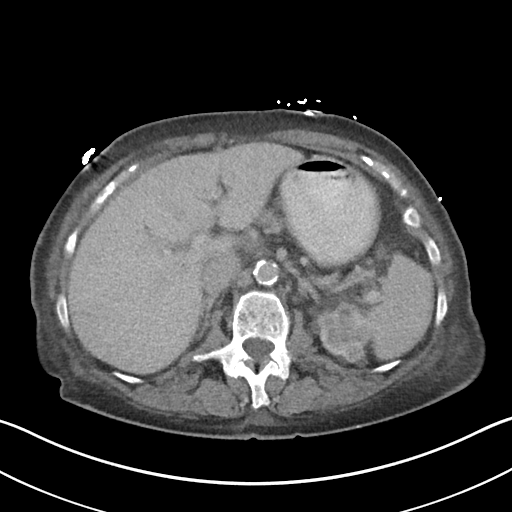
[im 61/89  bone]
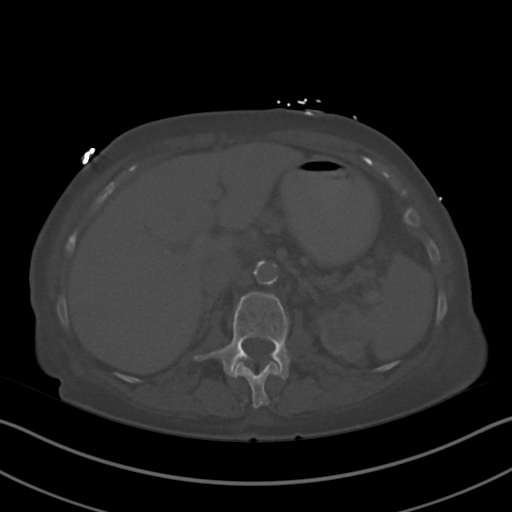
[im 70/89  soft-tissue]
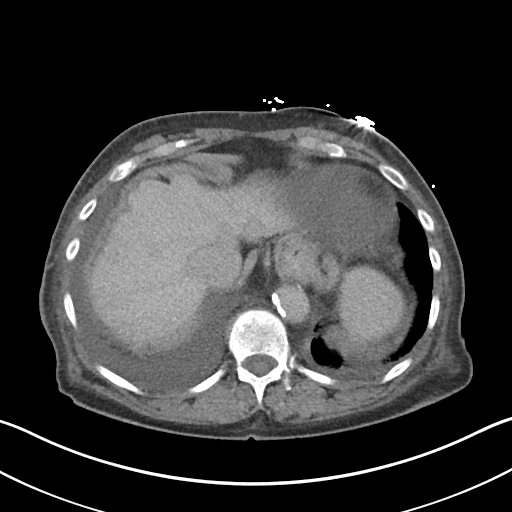
[im 75/89  soft-tissue]
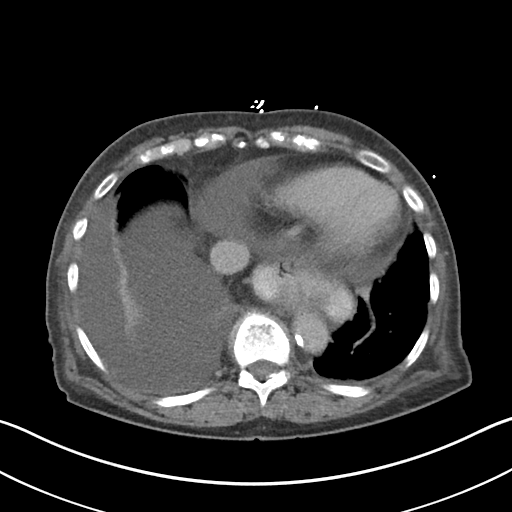
[im 84/89  soft-tissue]
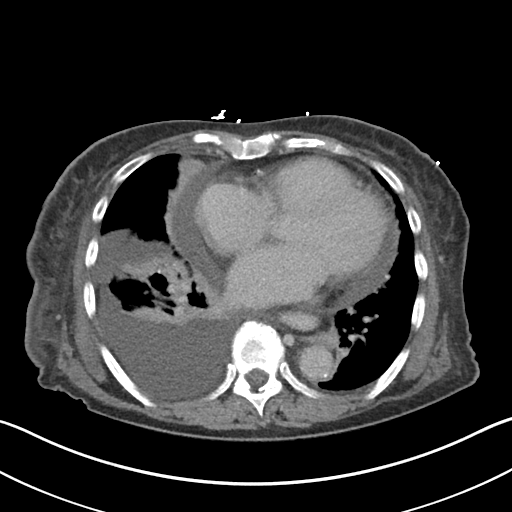

[Series 6: coronal soft tissue · coronal · 0.74mm/px · 3 of 77 slices shown]
[im 26/77  soft-tissue]
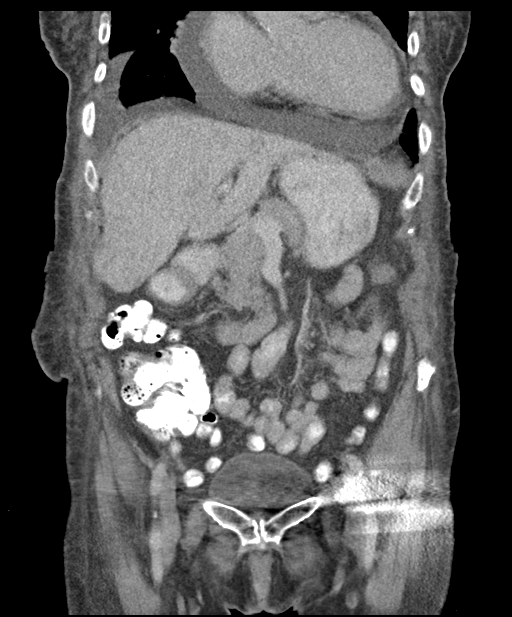
[im 34/77  soft-tissue]
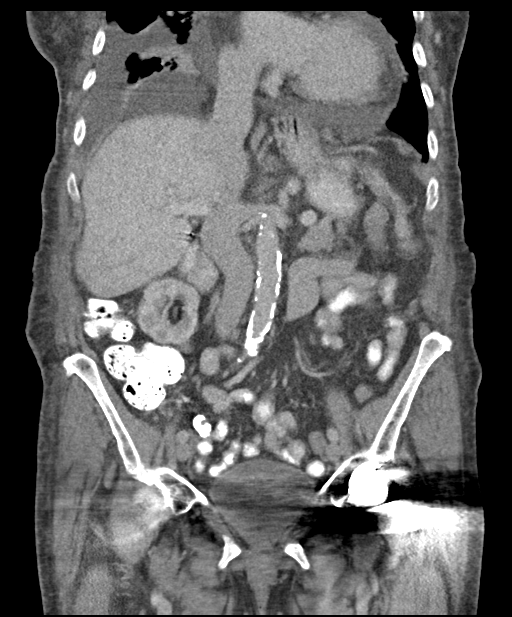
[im 43/77  soft-tissue]
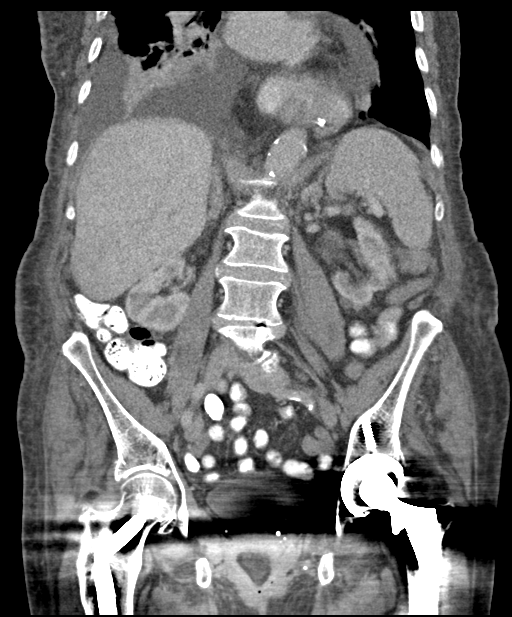

[15 of 46 positions shown; findings below may reference images not displayed]

FINDINGS: Lower chest: Cardiomegaly with coronary arteriosclerosis. Werku
pericardial effusion measuring up to 1.9 cm along the right lateral
heart border and 1.6 cm posterolaterally on the left are noted.
Moderate right effusion with compressive atelectasis is seen at the
right lung base. Trace left effusion is present as well.

Hepatobiliary: Cholecystectomy. Homogeneous enhancement of the liver
without mass or biliary dilatation.

Pancreas: Normal

Spleen: Normal

Adrenals/Urinary Tract: Normal bilateral adrenal glands. Renal
cortical thinning and bilateral renal hypodensities statistically
consistent with cysts, some of which are too small to further
characterize are noted. No definite enhancing mass, nephrolithiasis
nor hydroureteronephrosis. The urinary bladder is unremarkable for
the degree of distention. Bladder is partially obscured however by
the patient's right hip fixation hardware and left hip arthroplasty.

Stomach/Bowel: Stomach is within normal limits. Appendix is
surgically absent. No evidence of bowel wall thickening, distention,
or inflammatory changes.

Vascular/Lymphatic: Moderate aortoiliac and atherosclerosis without
aneurysm. No lymphadenopathy by CT size criteria.

Reproductive: Hysterectomy.  No adnexal mass.

Other: Stigmata ventral hernia repair.  No free air nor free fluid.

Musculoskeletal: Thoracolumbar spondylosis with multilevel
degenerative disc disease most pronounced at L1-2, L4-5 and L5-S1.
Degenerative grade 1 anterolisthesis of L4 on L5 is noted likely on
the basis of facet arthropathy. No pars defects. No suspicious
osseous lesions.
IMPRESSION: 1. No CT findings to explain the patient's gastrointestinal
bleeding. No bowel obstruction or definite inflammation.
2. Cardiomegaly with coronary arteriosclerosis and small pericardial
effusion as above. Moderate right and trace left pleural effusions.
3. Bilateral renal hypodensities statistically consistent with
cysts. No obstructive uropathy.
4. Lumbar and thoracic spondylosis.
5. Moderate aortoiliac atherosclerosis.  No aneurysm.

## 2019-04-25 ENCOUNTER — Inpatient Hospital Stay (HOSPITAL_COMMUNITY)
Admission: AD | Admit: 2019-04-25 | Payer: Medicare Other | Source: Other Acute Inpatient Hospital | Admitting: Internal Medicine

## 2019-04-26 DIAGNOSIS — I10 Essential (primary) hypertension: Secondary | ICD-10-CM

## 2019-04-26 DIAGNOSIS — D62 Acute posthemorrhagic anemia: Secondary | ICD-10-CM

## 2019-04-26 DIAGNOSIS — I4891 Unspecified atrial fibrillation: Secondary | ICD-10-CM

## 2019-04-27 DIAGNOSIS — I4891 Unspecified atrial fibrillation: Secondary | ICD-10-CM | POA: Diagnosis not present

## 2019-04-27 DIAGNOSIS — D62 Acute posthemorrhagic anemia: Secondary | ICD-10-CM | POA: Diagnosis not present

## 2019-04-27 DIAGNOSIS — I10 Essential (primary) hypertension: Secondary | ICD-10-CM | POA: Diagnosis not present

## 2019-04-28 DIAGNOSIS — I4891 Unspecified atrial fibrillation: Secondary | ICD-10-CM | POA: Diagnosis not present

## 2019-04-28 DIAGNOSIS — D62 Acute posthemorrhagic anemia: Secondary | ICD-10-CM | POA: Diagnosis not present

## 2019-04-28 DIAGNOSIS — I10 Essential (primary) hypertension: Secondary | ICD-10-CM | POA: Diagnosis not present

## 2019-06-11 DIAGNOSIS — G4700 Insomnia, unspecified: Secondary | ICD-10-CM | POA: Diagnosis not present

## 2019-06-11 DIAGNOSIS — J449 Chronic obstructive pulmonary disease, unspecified: Secondary | ICD-10-CM | POA: Diagnosis not present

## 2019-06-11 DIAGNOSIS — G8929 Other chronic pain: Secondary | ICD-10-CM | POA: Diagnosis not present

## 2019-06-11 DIAGNOSIS — R69 Illness, unspecified: Secondary | ICD-10-CM | POA: Diagnosis not present

## 2019-06-12 DIAGNOSIS — G8929 Other chronic pain: Secondary | ICD-10-CM | POA: Diagnosis not present

## 2019-06-12 DIAGNOSIS — J449 Chronic obstructive pulmonary disease, unspecified: Secondary | ICD-10-CM | POA: Diagnosis not present

## 2019-06-12 DIAGNOSIS — G4700 Insomnia, unspecified: Secondary | ICD-10-CM | POA: Diagnosis not present

## 2019-06-12 DIAGNOSIS — R69 Illness, unspecified: Secondary | ICD-10-CM | POA: Diagnosis not present

## 2019-06-13 DIAGNOSIS — G8929 Other chronic pain: Secondary | ICD-10-CM | POA: Diagnosis not present

## 2019-06-13 DIAGNOSIS — J449 Chronic obstructive pulmonary disease, unspecified: Secondary | ICD-10-CM | POA: Diagnosis not present

## 2019-06-13 DIAGNOSIS — G4700 Insomnia, unspecified: Secondary | ICD-10-CM | POA: Diagnosis not present

## 2019-06-13 DIAGNOSIS — R69 Illness, unspecified: Secondary | ICD-10-CM | POA: Diagnosis not present

## 2019-06-14 DIAGNOSIS — R69 Illness, unspecified: Secondary | ICD-10-CM | POA: Diagnosis not present

## 2019-06-14 DIAGNOSIS — N3289 Other specified disorders of bladder: Secondary | ICD-10-CM | POA: Diagnosis not present

## 2019-06-14 DIAGNOSIS — N3946 Mixed incontinence: Secondary | ICD-10-CM | POA: Diagnosis not present

## 2019-06-14 DIAGNOSIS — G4700 Insomnia, unspecified: Secondary | ICD-10-CM | POA: Diagnosis not present

## 2019-06-14 DIAGNOSIS — I10 Essential (primary) hypertension: Secondary | ICD-10-CM | POA: Diagnosis not present

## 2019-06-14 DIAGNOSIS — G8929 Other chronic pain: Secondary | ICD-10-CM | POA: Diagnosis not present

## 2019-06-14 DIAGNOSIS — J449 Chronic obstructive pulmonary disease, unspecified: Secondary | ICD-10-CM | POA: Diagnosis not present

## 2019-06-15 DIAGNOSIS — Z1331 Encounter for screening for depression: Secondary | ICD-10-CM | POA: Diagnosis not present

## 2019-06-15 DIAGNOSIS — G4700 Insomnia, unspecified: Secondary | ICD-10-CM | POA: Diagnosis not present

## 2019-06-15 DIAGNOSIS — J449 Chronic obstructive pulmonary disease, unspecified: Secondary | ICD-10-CM | POA: Diagnosis not present

## 2019-06-15 DIAGNOSIS — Z1339 Encounter for screening examination for other mental health and behavioral disorders: Secondary | ICD-10-CM | POA: Diagnosis not present

## 2019-06-15 DIAGNOSIS — Z139 Encounter for screening, unspecified: Secondary | ICD-10-CM | POA: Diagnosis not present

## 2019-06-15 DIAGNOSIS — D649 Anemia, unspecified: Secondary | ICD-10-CM | POA: Diagnosis not present

## 2019-06-15 DIAGNOSIS — Z7189 Other specified counseling: Secondary | ICD-10-CM | POA: Diagnosis not present

## 2019-06-15 DIAGNOSIS — R69 Illness, unspecified: Secondary | ICD-10-CM | POA: Diagnosis not present

## 2019-06-15 DIAGNOSIS — Z0000 Encounter for general adult medical examination without abnormal findings: Secondary | ICD-10-CM | POA: Diagnosis not present

## 2019-06-15 DIAGNOSIS — Z136 Encounter for screening for cardiovascular disorders: Secondary | ICD-10-CM | POA: Diagnosis not present

## 2019-06-15 DIAGNOSIS — G8929 Other chronic pain: Secondary | ICD-10-CM | POA: Diagnosis not present

## 2019-06-15 DIAGNOSIS — E785 Hyperlipidemia, unspecified: Secondary | ICD-10-CM | POA: Diagnosis not present

## 2019-06-15 DIAGNOSIS — I10 Essential (primary) hypertension: Secondary | ICD-10-CM | POA: Diagnosis not present

## 2019-06-15 DIAGNOSIS — Z8639 Personal history of other endocrine, nutritional and metabolic disease: Secondary | ICD-10-CM | POA: Diagnosis not present

## 2019-06-16 DIAGNOSIS — J449 Chronic obstructive pulmonary disease, unspecified: Secondary | ICD-10-CM | POA: Diagnosis not present

## 2019-06-16 DIAGNOSIS — G8929 Other chronic pain: Secondary | ICD-10-CM | POA: Diagnosis not present

## 2019-06-16 DIAGNOSIS — R69 Illness, unspecified: Secondary | ICD-10-CM | POA: Diagnosis not present

## 2019-06-16 DIAGNOSIS — G4700 Insomnia, unspecified: Secondary | ICD-10-CM | POA: Diagnosis not present

## 2019-06-17 DIAGNOSIS — R69 Illness, unspecified: Secondary | ICD-10-CM | POA: Diagnosis not present

## 2019-06-17 DIAGNOSIS — J449 Chronic obstructive pulmonary disease, unspecified: Secondary | ICD-10-CM | POA: Diagnosis not present

## 2019-06-17 DIAGNOSIS — G4700 Insomnia, unspecified: Secondary | ICD-10-CM | POA: Diagnosis not present

## 2019-06-17 DIAGNOSIS — G8929 Other chronic pain: Secondary | ICD-10-CM | POA: Diagnosis not present

## 2019-06-18 DIAGNOSIS — G8929 Other chronic pain: Secondary | ICD-10-CM | POA: Diagnosis not present

## 2019-06-18 DIAGNOSIS — R69 Illness, unspecified: Secondary | ICD-10-CM | POA: Diagnosis not present

## 2019-06-18 DIAGNOSIS — J449 Chronic obstructive pulmonary disease, unspecified: Secondary | ICD-10-CM | POA: Diagnosis not present

## 2019-06-18 DIAGNOSIS — G4700 Insomnia, unspecified: Secondary | ICD-10-CM | POA: Diagnosis not present

## 2019-06-19 DIAGNOSIS — G4700 Insomnia, unspecified: Secondary | ICD-10-CM | POA: Diagnosis not present

## 2019-06-19 DIAGNOSIS — J449 Chronic obstructive pulmonary disease, unspecified: Secondary | ICD-10-CM | POA: Diagnosis not present

## 2019-06-19 DIAGNOSIS — R69 Illness, unspecified: Secondary | ICD-10-CM | POA: Diagnosis not present

## 2019-06-19 DIAGNOSIS — G8929 Other chronic pain: Secondary | ICD-10-CM | POA: Diagnosis not present

## 2019-06-20 DIAGNOSIS — R69 Illness, unspecified: Secondary | ICD-10-CM | POA: Diagnosis not present

## 2019-06-20 DIAGNOSIS — J449 Chronic obstructive pulmonary disease, unspecified: Secondary | ICD-10-CM | POA: Diagnosis not present

## 2019-06-20 DIAGNOSIS — G4700 Insomnia, unspecified: Secondary | ICD-10-CM | POA: Diagnosis not present

## 2019-06-20 DIAGNOSIS — G8929 Other chronic pain: Secondary | ICD-10-CM | POA: Diagnosis not present

## 2019-06-21 DIAGNOSIS — R69 Illness, unspecified: Secondary | ICD-10-CM | POA: Diagnosis not present

## 2019-06-21 DIAGNOSIS — G4700 Insomnia, unspecified: Secondary | ICD-10-CM | POA: Diagnosis not present

## 2019-06-21 DIAGNOSIS — J449 Chronic obstructive pulmonary disease, unspecified: Secondary | ICD-10-CM | POA: Diagnosis not present

## 2019-06-21 DIAGNOSIS — G8929 Other chronic pain: Secondary | ICD-10-CM | POA: Diagnosis not present

## 2019-06-22 DIAGNOSIS — G4700 Insomnia, unspecified: Secondary | ICD-10-CM | POA: Diagnosis not present

## 2019-06-22 DIAGNOSIS — R69 Illness, unspecified: Secondary | ICD-10-CM | POA: Diagnosis not present

## 2019-06-22 DIAGNOSIS — J449 Chronic obstructive pulmonary disease, unspecified: Secondary | ICD-10-CM | POA: Diagnosis not present

## 2019-06-22 DIAGNOSIS — G8929 Other chronic pain: Secondary | ICD-10-CM | POA: Diagnosis not present

## 2019-06-23 DIAGNOSIS — G8929 Other chronic pain: Secondary | ICD-10-CM | POA: Diagnosis not present

## 2019-06-23 DIAGNOSIS — R69 Illness, unspecified: Secondary | ICD-10-CM | POA: Diagnosis not present

## 2019-06-23 DIAGNOSIS — G4700 Insomnia, unspecified: Secondary | ICD-10-CM | POA: Diagnosis not present

## 2019-06-23 DIAGNOSIS — J449 Chronic obstructive pulmonary disease, unspecified: Secondary | ICD-10-CM | POA: Diagnosis not present

## 2019-06-24 DIAGNOSIS — J449 Chronic obstructive pulmonary disease, unspecified: Secondary | ICD-10-CM | POA: Diagnosis not present

## 2019-06-24 DIAGNOSIS — G8929 Other chronic pain: Secondary | ICD-10-CM | POA: Diagnosis not present

## 2019-06-24 DIAGNOSIS — G4700 Insomnia, unspecified: Secondary | ICD-10-CM | POA: Diagnosis not present

## 2019-06-24 DIAGNOSIS — R69 Illness, unspecified: Secondary | ICD-10-CM | POA: Diagnosis not present

## 2019-06-25 DIAGNOSIS — R69 Illness, unspecified: Secondary | ICD-10-CM | POA: Diagnosis not present

## 2019-06-25 DIAGNOSIS — J449 Chronic obstructive pulmonary disease, unspecified: Secondary | ICD-10-CM | POA: Diagnosis not present

## 2019-06-25 DIAGNOSIS — G8929 Other chronic pain: Secondary | ICD-10-CM | POA: Diagnosis not present

## 2019-06-25 DIAGNOSIS — G4700 Insomnia, unspecified: Secondary | ICD-10-CM | POA: Diagnosis not present

## 2019-06-26 DIAGNOSIS — J449 Chronic obstructive pulmonary disease, unspecified: Secondary | ICD-10-CM | POA: Diagnosis not present

## 2019-06-26 DIAGNOSIS — G4700 Insomnia, unspecified: Secondary | ICD-10-CM | POA: Diagnosis not present

## 2019-06-26 DIAGNOSIS — R69 Illness, unspecified: Secondary | ICD-10-CM | POA: Diagnosis not present

## 2019-06-26 DIAGNOSIS — G8929 Other chronic pain: Secondary | ICD-10-CM | POA: Diagnosis not present

## 2019-06-27 DIAGNOSIS — G8929 Other chronic pain: Secondary | ICD-10-CM | POA: Diagnosis not present

## 2019-06-27 DIAGNOSIS — G4700 Insomnia, unspecified: Secondary | ICD-10-CM | POA: Diagnosis not present

## 2019-06-27 DIAGNOSIS — J449 Chronic obstructive pulmonary disease, unspecified: Secondary | ICD-10-CM | POA: Diagnosis not present

## 2019-06-27 DIAGNOSIS — R69 Illness, unspecified: Secondary | ICD-10-CM | POA: Diagnosis not present

## 2019-06-28 DIAGNOSIS — G8929 Other chronic pain: Secondary | ICD-10-CM | POA: Diagnosis not present

## 2019-06-28 DIAGNOSIS — G4700 Insomnia, unspecified: Secondary | ICD-10-CM | POA: Diagnosis not present

## 2019-06-28 DIAGNOSIS — J449 Chronic obstructive pulmonary disease, unspecified: Secondary | ICD-10-CM | POA: Diagnosis not present

## 2019-06-28 DIAGNOSIS — R69 Illness, unspecified: Secondary | ICD-10-CM | POA: Diagnosis not present

## 2019-06-29 DIAGNOSIS — R69 Illness, unspecified: Secondary | ICD-10-CM | POA: Diagnosis not present

## 2019-06-29 DIAGNOSIS — G4700 Insomnia, unspecified: Secondary | ICD-10-CM | POA: Diagnosis not present

## 2019-06-29 DIAGNOSIS — J449 Chronic obstructive pulmonary disease, unspecified: Secondary | ICD-10-CM | POA: Diagnosis not present

## 2019-06-29 DIAGNOSIS — G8929 Other chronic pain: Secondary | ICD-10-CM | POA: Diagnosis not present

## 2019-06-30 DIAGNOSIS — G4700 Insomnia, unspecified: Secondary | ICD-10-CM | POA: Diagnosis not present

## 2019-06-30 DIAGNOSIS — J449 Chronic obstructive pulmonary disease, unspecified: Secondary | ICD-10-CM | POA: Diagnosis not present

## 2019-06-30 DIAGNOSIS — G8929 Other chronic pain: Secondary | ICD-10-CM | POA: Diagnosis not present

## 2019-06-30 DIAGNOSIS — R69 Illness, unspecified: Secondary | ICD-10-CM | POA: Diagnosis not present

## 2019-07-01 DIAGNOSIS — G8929 Other chronic pain: Secondary | ICD-10-CM | POA: Diagnosis not present

## 2019-07-01 DIAGNOSIS — R69 Illness, unspecified: Secondary | ICD-10-CM | POA: Diagnosis not present

## 2019-07-01 DIAGNOSIS — J449 Chronic obstructive pulmonary disease, unspecified: Secondary | ICD-10-CM | POA: Diagnosis not present

## 2019-07-01 DIAGNOSIS — G4700 Insomnia, unspecified: Secondary | ICD-10-CM | POA: Diagnosis not present

## 2019-07-02 DIAGNOSIS — R69 Illness, unspecified: Secondary | ICD-10-CM | POA: Diagnosis not present

## 2019-07-02 DIAGNOSIS — J449 Chronic obstructive pulmonary disease, unspecified: Secondary | ICD-10-CM | POA: Diagnosis not present

## 2019-07-02 DIAGNOSIS — G8929 Other chronic pain: Secondary | ICD-10-CM | POA: Diagnosis not present

## 2019-07-02 DIAGNOSIS — G4700 Insomnia, unspecified: Secondary | ICD-10-CM | POA: Diagnosis not present

## 2019-07-03 DIAGNOSIS — R69 Illness, unspecified: Secondary | ICD-10-CM | POA: Diagnosis not present

## 2019-07-03 DIAGNOSIS — G8929 Other chronic pain: Secondary | ICD-10-CM | POA: Diagnosis not present

## 2019-07-03 DIAGNOSIS — D509 Iron deficiency anemia, unspecified: Secondary | ICD-10-CM | POA: Diagnosis not present

## 2019-07-03 DIAGNOSIS — J449 Chronic obstructive pulmonary disease, unspecified: Secondary | ICD-10-CM | POA: Diagnosis not present

## 2019-07-03 DIAGNOSIS — I10 Essential (primary) hypertension: Secondary | ICD-10-CM | POA: Diagnosis not present

## 2019-07-03 DIAGNOSIS — I509 Heart failure, unspecified: Secondary | ICD-10-CM | POA: Diagnosis not present

## 2019-07-03 DIAGNOSIS — G4700 Insomnia, unspecified: Secondary | ICD-10-CM | POA: Diagnosis not present

## 2019-07-04 DIAGNOSIS — G8929 Other chronic pain: Secondary | ICD-10-CM | POA: Diagnosis not present

## 2019-07-04 DIAGNOSIS — G4700 Insomnia, unspecified: Secondary | ICD-10-CM | POA: Diagnosis not present

## 2019-07-04 DIAGNOSIS — J449 Chronic obstructive pulmonary disease, unspecified: Secondary | ICD-10-CM | POA: Diagnosis not present

## 2019-07-04 DIAGNOSIS — R69 Illness, unspecified: Secondary | ICD-10-CM | POA: Diagnosis not present

## 2019-07-05 DIAGNOSIS — G8929 Other chronic pain: Secondary | ICD-10-CM | POA: Diagnosis not present

## 2019-07-05 DIAGNOSIS — G4700 Insomnia, unspecified: Secondary | ICD-10-CM | POA: Diagnosis not present

## 2019-07-05 DIAGNOSIS — R69 Illness, unspecified: Secondary | ICD-10-CM | POA: Diagnosis not present

## 2019-07-05 DIAGNOSIS — J449 Chronic obstructive pulmonary disease, unspecified: Secondary | ICD-10-CM | POA: Diagnosis not present

## 2019-07-06 DIAGNOSIS — G4700 Insomnia, unspecified: Secondary | ICD-10-CM | POA: Diagnosis not present

## 2019-07-06 DIAGNOSIS — G8929 Other chronic pain: Secondary | ICD-10-CM | POA: Diagnosis not present

## 2019-07-06 DIAGNOSIS — J449 Chronic obstructive pulmonary disease, unspecified: Secondary | ICD-10-CM | POA: Diagnosis not present

## 2019-07-06 DIAGNOSIS — R69 Illness, unspecified: Secondary | ICD-10-CM | POA: Diagnosis not present

## 2019-07-07 DIAGNOSIS — R69 Illness, unspecified: Secondary | ICD-10-CM | POA: Diagnosis not present

## 2019-07-07 DIAGNOSIS — J449 Chronic obstructive pulmonary disease, unspecified: Secondary | ICD-10-CM | POA: Diagnosis not present

## 2019-07-07 DIAGNOSIS — G8929 Other chronic pain: Secondary | ICD-10-CM | POA: Diagnosis not present

## 2019-07-07 DIAGNOSIS — I509 Heart failure, unspecified: Secondary | ICD-10-CM | POA: Diagnosis not present

## 2019-07-07 DIAGNOSIS — Z6827 Body mass index (BMI) 27.0-27.9, adult: Secondary | ICD-10-CM | POA: Diagnosis not present

## 2019-07-07 DIAGNOSIS — N1831 Chronic kidney disease, stage 3a: Secondary | ICD-10-CM | POA: Diagnosis not present

## 2019-07-07 DIAGNOSIS — G4700 Insomnia, unspecified: Secondary | ICD-10-CM | POA: Diagnosis not present

## 2019-07-08 DIAGNOSIS — R079 Chest pain, unspecified: Secondary | ICD-10-CM | POA: Diagnosis not present

## 2019-07-08 DIAGNOSIS — Z743 Need for continuous supervision: Secondary | ICD-10-CM | POA: Diagnosis not present

## 2019-07-08 DIAGNOSIS — G4700 Insomnia, unspecified: Secondary | ICD-10-CM | POA: Diagnosis not present

## 2019-07-08 DIAGNOSIS — I4891 Unspecified atrial fibrillation: Secondary | ICD-10-CM | POA: Diagnosis not present

## 2019-07-08 DIAGNOSIS — R69 Illness, unspecified: Secondary | ICD-10-CM | POA: Diagnosis not present

## 2019-07-08 DIAGNOSIS — J449 Chronic obstructive pulmonary disease, unspecified: Secondary | ICD-10-CM | POA: Diagnosis not present

## 2019-07-08 DIAGNOSIS — R0602 Shortness of breath: Secondary | ICD-10-CM | POA: Diagnosis not present

## 2019-07-08 DIAGNOSIS — R0789 Other chest pain: Secondary | ICD-10-CM | POA: Diagnosis not present

## 2019-07-08 DIAGNOSIS — Z8673 Personal history of transient ischemic attack (TIA), and cerebral infarction without residual deficits: Secondary | ICD-10-CM | POA: Diagnosis not present

## 2019-07-08 DIAGNOSIS — I509 Heart failure, unspecified: Secondary | ICD-10-CM | POA: Diagnosis not present

## 2019-07-08 DIAGNOSIS — I110 Hypertensive heart disease with heart failure: Secondary | ICD-10-CM | POA: Diagnosis not present

## 2019-07-08 DIAGNOSIS — G8929 Other chronic pain: Secondary | ICD-10-CM | POA: Diagnosis not present

## 2019-07-19 DIAGNOSIS — M47816 Spondylosis without myelopathy or radiculopathy, lumbar region: Secondary | ICD-10-CM | POA: Diagnosis not present

## 2019-07-19 DIAGNOSIS — R69 Illness, unspecified: Secondary | ICD-10-CM | POA: Diagnosis not present

## 2019-07-19 DIAGNOSIS — M542 Cervicalgia: Secondary | ICD-10-CM | POA: Diagnosis not present

## 2019-07-19 DIAGNOSIS — M546 Pain in thoracic spine: Secondary | ICD-10-CM | POA: Diagnosis not present

## 2019-07-19 DIAGNOSIS — M545 Low back pain: Secondary | ICD-10-CM | POA: Diagnosis not present

## 2019-07-19 DIAGNOSIS — Z1389 Encounter for screening for other disorder: Secondary | ICD-10-CM | POA: Diagnosis not present

## 2019-07-19 DIAGNOSIS — M5412 Radiculopathy, cervical region: Secondary | ICD-10-CM | POA: Diagnosis not present

## 2019-07-19 DIAGNOSIS — G894 Chronic pain syndrome: Secondary | ICD-10-CM | POA: Diagnosis not present

## 2019-08-03 DIAGNOSIS — N1831 Chronic kidney disease, stage 3a: Secondary | ICD-10-CM | POA: Diagnosis not present

## 2019-08-03 DIAGNOSIS — J449 Chronic obstructive pulmonary disease, unspecified: Secondary | ICD-10-CM | POA: Diagnosis not present

## 2019-08-03 DIAGNOSIS — I10 Essential (primary) hypertension: Secondary | ICD-10-CM | POA: Diagnosis not present

## 2019-08-03 DIAGNOSIS — I509 Heart failure, unspecified: Secondary | ICD-10-CM | POA: Diagnosis not present

## 2019-08-06 DIAGNOSIS — G4700 Insomnia, unspecified: Secondary | ICD-10-CM | POA: Diagnosis not present

## 2019-08-06 DIAGNOSIS — G8929 Other chronic pain: Secondary | ICD-10-CM | POA: Diagnosis not present

## 2019-08-06 DIAGNOSIS — J449 Chronic obstructive pulmonary disease, unspecified: Secondary | ICD-10-CM | POA: Diagnosis not present

## 2019-08-06 DIAGNOSIS — R69 Illness, unspecified: Secondary | ICD-10-CM | POA: Diagnosis not present

## 2019-08-07 DIAGNOSIS — G8929 Other chronic pain: Secondary | ICD-10-CM | POA: Diagnosis not present

## 2019-08-07 DIAGNOSIS — G4700 Insomnia, unspecified: Secondary | ICD-10-CM | POA: Diagnosis not present

## 2019-08-07 DIAGNOSIS — R69 Illness, unspecified: Secondary | ICD-10-CM | POA: Diagnosis not present

## 2019-08-07 DIAGNOSIS — J449 Chronic obstructive pulmonary disease, unspecified: Secondary | ICD-10-CM | POA: Diagnosis not present

## 2019-08-08 DIAGNOSIS — G4700 Insomnia, unspecified: Secondary | ICD-10-CM | POA: Diagnosis not present

## 2019-08-08 DIAGNOSIS — R69 Illness, unspecified: Secondary | ICD-10-CM | POA: Diagnosis not present

## 2019-08-08 DIAGNOSIS — G8929 Other chronic pain: Secondary | ICD-10-CM | POA: Diagnosis not present

## 2019-08-08 DIAGNOSIS — J449 Chronic obstructive pulmonary disease, unspecified: Secondary | ICD-10-CM | POA: Diagnosis not present

## 2019-08-09 DIAGNOSIS — R69 Illness, unspecified: Secondary | ICD-10-CM | POA: Diagnosis not present

## 2019-08-09 DIAGNOSIS — G4700 Insomnia, unspecified: Secondary | ICD-10-CM | POA: Diagnosis not present

## 2019-08-09 DIAGNOSIS — G8929 Other chronic pain: Secondary | ICD-10-CM | POA: Diagnosis not present

## 2019-08-09 DIAGNOSIS — J449 Chronic obstructive pulmonary disease, unspecified: Secondary | ICD-10-CM | POA: Diagnosis not present

## 2019-08-10 DIAGNOSIS — G4700 Insomnia, unspecified: Secondary | ICD-10-CM | POA: Diagnosis not present

## 2019-08-10 DIAGNOSIS — G8929 Other chronic pain: Secondary | ICD-10-CM | POA: Diagnosis not present

## 2019-08-10 DIAGNOSIS — J449 Chronic obstructive pulmonary disease, unspecified: Secondary | ICD-10-CM | POA: Diagnosis not present

## 2019-08-10 DIAGNOSIS — R69 Illness, unspecified: Secondary | ICD-10-CM | POA: Diagnosis not present

## 2019-08-11 DIAGNOSIS — R69 Illness, unspecified: Secondary | ICD-10-CM | POA: Diagnosis not present

## 2019-08-11 DIAGNOSIS — G8929 Other chronic pain: Secondary | ICD-10-CM | POA: Diagnosis not present

## 2019-08-11 DIAGNOSIS — G4700 Insomnia, unspecified: Secondary | ICD-10-CM | POA: Diagnosis not present

## 2019-08-11 DIAGNOSIS — J449 Chronic obstructive pulmonary disease, unspecified: Secondary | ICD-10-CM | POA: Diagnosis not present

## 2019-08-12 DIAGNOSIS — G4700 Insomnia, unspecified: Secondary | ICD-10-CM | POA: Diagnosis not present

## 2019-08-12 DIAGNOSIS — G8929 Other chronic pain: Secondary | ICD-10-CM | POA: Diagnosis not present

## 2019-08-12 DIAGNOSIS — R69 Illness, unspecified: Secondary | ICD-10-CM | POA: Diagnosis not present

## 2019-08-12 DIAGNOSIS — J449 Chronic obstructive pulmonary disease, unspecified: Secondary | ICD-10-CM | POA: Diagnosis not present

## 2019-08-13 DIAGNOSIS — J449 Chronic obstructive pulmonary disease, unspecified: Secondary | ICD-10-CM | POA: Diagnosis not present

## 2019-08-13 DIAGNOSIS — G8929 Other chronic pain: Secondary | ICD-10-CM | POA: Diagnosis not present

## 2019-08-13 DIAGNOSIS — R69 Illness, unspecified: Secondary | ICD-10-CM | POA: Diagnosis not present

## 2019-08-13 DIAGNOSIS — G4700 Insomnia, unspecified: Secondary | ICD-10-CM | POA: Diagnosis not present

## 2019-08-14 DIAGNOSIS — R69 Illness, unspecified: Secondary | ICD-10-CM | POA: Diagnosis not present

## 2019-08-14 DIAGNOSIS — J449 Chronic obstructive pulmonary disease, unspecified: Secondary | ICD-10-CM | POA: Diagnosis not present

## 2019-08-14 DIAGNOSIS — G4700 Insomnia, unspecified: Secondary | ICD-10-CM | POA: Diagnosis not present

## 2019-08-14 DIAGNOSIS — G8929 Other chronic pain: Secondary | ICD-10-CM | POA: Diagnosis not present

## 2019-08-15 DIAGNOSIS — G4700 Insomnia, unspecified: Secondary | ICD-10-CM | POA: Diagnosis not present

## 2019-08-15 DIAGNOSIS — J449 Chronic obstructive pulmonary disease, unspecified: Secondary | ICD-10-CM | POA: Diagnosis not present

## 2019-08-15 DIAGNOSIS — R69 Illness, unspecified: Secondary | ICD-10-CM | POA: Diagnosis not present

## 2019-08-15 DIAGNOSIS — G8929 Other chronic pain: Secondary | ICD-10-CM | POA: Diagnosis not present

## 2019-08-16 DIAGNOSIS — R69 Illness, unspecified: Secondary | ICD-10-CM | POA: Diagnosis not present

## 2019-08-16 DIAGNOSIS — N3946 Mixed incontinence: Secondary | ICD-10-CM | POA: Diagnosis not present

## 2019-08-16 DIAGNOSIS — J449 Chronic obstructive pulmonary disease, unspecified: Secondary | ICD-10-CM | POA: Diagnosis not present

## 2019-08-16 DIAGNOSIS — N3289 Other specified disorders of bladder: Secondary | ICD-10-CM | POA: Diagnosis not present

## 2019-08-16 DIAGNOSIS — G4700 Insomnia, unspecified: Secondary | ICD-10-CM | POA: Diagnosis not present

## 2019-08-16 DIAGNOSIS — I10 Essential (primary) hypertension: Secondary | ICD-10-CM | POA: Diagnosis not present

## 2019-08-16 DIAGNOSIS — G8929 Other chronic pain: Secondary | ICD-10-CM | POA: Diagnosis not present

## 2019-08-17 DIAGNOSIS — J449 Chronic obstructive pulmonary disease, unspecified: Secondary | ICD-10-CM | POA: Diagnosis not present

## 2019-08-17 DIAGNOSIS — G8929 Other chronic pain: Secondary | ICD-10-CM | POA: Diagnosis not present

## 2019-08-17 DIAGNOSIS — G4700 Insomnia, unspecified: Secondary | ICD-10-CM | POA: Diagnosis not present

## 2019-08-17 DIAGNOSIS — R69 Illness, unspecified: Secondary | ICD-10-CM | POA: Diagnosis not present

## 2019-08-18 DIAGNOSIS — J449 Chronic obstructive pulmonary disease, unspecified: Secondary | ICD-10-CM | POA: Diagnosis not present

## 2019-08-18 DIAGNOSIS — R69 Illness, unspecified: Secondary | ICD-10-CM | POA: Diagnosis not present

## 2019-08-18 DIAGNOSIS — G4700 Insomnia, unspecified: Secondary | ICD-10-CM | POA: Diagnosis not present

## 2019-08-18 DIAGNOSIS — G8929 Other chronic pain: Secondary | ICD-10-CM | POA: Diagnosis not present

## 2019-08-19 DIAGNOSIS — J449 Chronic obstructive pulmonary disease, unspecified: Secondary | ICD-10-CM | POA: Diagnosis not present

## 2019-08-19 DIAGNOSIS — R69 Illness, unspecified: Secondary | ICD-10-CM | POA: Diagnosis not present

## 2019-08-19 DIAGNOSIS — G4700 Insomnia, unspecified: Secondary | ICD-10-CM | POA: Diagnosis not present

## 2019-08-19 DIAGNOSIS — G8929 Other chronic pain: Secondary | ICD-10-CM | POA: Diagnosis not present

## 2019-08-20 DIAGNOSIS — J449 Chronic obstructive pulmonary disease, unspecified: Secondary | ICD-10-CM | POA: Diagnosis not present

## 2019-08-20 DIAGNOSIS — G8929 Other chronic pain: Secondary | ICD-10-CM | POA: Diagnosis not present

## 2019-08-20 DIAGNOSIS — R69 Illness, unspecified: Secondary | ICD-10-CM | POA: Diagnosis not present

## 2019-08-20 DIAGNOSIS — G4700 Insomnia, unspecified: Secondary | ICD-10-CM | POA: Diagnosis not present

## 2019-08-21 DIAGNOSIS — R69 Illness, unspecified: Secondary | ICD-10-CM | POA: Diagnosis not present

## 2019-08-21 DIAGNOSIS — J449 Chronic obstructive pulmonary disease, unspecified: Secondary | ICD-10-CM | POA: Diagnosis not present

## 2019-08-21 DIAGNOSIS — G4700 Insomnia, unspecified: Secondary | ICD-10-CM | POA: Diagnosis not present

## 2019-08-21 DIAGNOSIS — G8929 Other chronic pain: Secondary | ICD-10-CM | POA: Diagnosis not present

## 2019-08-22 DIAGNOSIS — G4700 Insomnia, unspecified: Secondary | ICD-10-CM | POA: Diagnosis not present

## 2019-08-22 DIAGNOSIS — G8929 Other chronic pain: Secondary | ICD-10-CM | POA: Diagnosis not present

## 2019-08-22 DIAGNOSIS — J449 Chronic obstructive pulmonary disease, unspecified: Secondary | ICD-10-CM | POA: Diagnosis not present

## 2019-08-22 DIAGNOSIS — R69 Illness, unspecified: Secondary | ICD-10-CM | POA: Diagnosis not present

## 2019-08-23 DIAGNOSIS — G4700 Insomnia, unspecified: Secondary | ICD-10-CM | POA: Diagnosis not present

## 2019-08-23 DIAGNOSIS — J449 Chronic obstructive pulmonary disease, unspecified: Secondary | ICD-10-CM | POA: Diagnosis not present

## 2019-08-23 DIAGNOSIS — G8929 Other chronic pain: Secondary | ICD-10-CM | POA: Diagnosis not present

## 2019-08-23 DIAGNOSIS — R69 Illness, unspecified: Secondary | ICD-10-CM | POA: Diagnosis not present

## 2019-08-24 DIAGNOSIS — G4700 Insomnia, unspecified: Secondary | ICD-10-CM | POA: Diagnosis not present

## 2019-08-24 DIAGNOSIS — R69 Illness, unspecified: Secondary | ICD-10-CM | POA: Diagnosis not present

## 2019-08-24 DIAGNOSIS — G8929 Other chronic pain: Secondary | ICD-10-CM | POA: Diagnosis not present

## 2019-08-24 DIAGNOSIS — J449 Chronic obstructive pulmonary disease, unspecified: Secondary | ICD-10-CM | POA: Diagnosis not present

## 2019-08-25 DIAGNOSIS — J449 Chronic obstructive pulmonary disease, unspecified: Secondary | ICD-10-CM | POA: Diagnosis not present

## 2019-08-25 DIAGNOSIS — R69 Illness, unspecified: Secondary | ICD-10-CM | POA: Diagnosis not present

## 2019-08-25 DIAGNOSIS — G4700 Insomnia, unspecified: Secondary | ICD-10-CM | POA: Diagnosis not present

## 2019-08-25 DIAGNOSIS — G8929 Other chronic pain: Secondary | ICD-10-CM | POA: Diagnosis not present

## 2019-08-26 DIAGNOSIS — G8929 Other chronic pain: Secondary | ICD-10-CM | POA: Diagnosis not present

## 2019-08-26 DIAGNOSIS — G4700 Insomnia, unspecified: Secondary | ICD-10-CM | POA: Diagnosis not present

## 2019-08-26 DIAGNOSIS — J449 Chronic obstructive pulmonary disease, unspecified: Secondary | ICD-10-CM | POA: Diagnosis not present

## 2019-08-26 DIAGNOSIS — R69 Illness, unspecified: Secondary | ICD-10-CM | POA: Diagnosis not present

## 2019-08-27 DIAGNOSIS — R69 Illness, unspecified: Secondary | ICD-10-CM | POA: Diagnosis not present

## 2019-08-27 DIAGNOSIS — G8929 Other chronic pain: Secondary | ICD-10-CM | POA: Diagnosis not present

## 2019-08-27 DIAGNOSIS — G4700 Insomnia, unspecified: Secondary | ICD-10-CM | POA: Diagnosis not present

## 2019-08-27 DIAGNOSIS — J449 Chronic obstructive pulmonary disease, unspecified: Secondary | ICD-10-CM | POA: Diagnosis not present

## 2019-08-28 DIAGNOSIS — R69 Illness, unspecified: Secondary | ICD-10-CM | POA: Diagnosis not present

## 2019-08-28 DIAGNOSIS — G4700 Insomnia, unspecified: Secondary | ICD-10-CM | POA: Diagnosis not present

## 2019-08-28 DIAGNOSIS — J449 Chronic obstructive pulmonary disease, unspecified: Secondary | ICD-10-CM | POA: Diagnosis not present

## 2019-08-28 DIAGNOSIS — G8929 Other chronic pain: Secondary | ICD-10-CM | POA: Diagnosis not present

## 2019-08-29 DIAGNOSIS — J449 Chronic obstructive pulmonary disease, unspecified: Secondary | ICD-10-CM | POA: Diagnosis not present

## 2019-08-29 DIAGNOSIS — G4700 Insomnia, unspecified: Secondary | ICD-10-CM | POA: Diagnosis not present

## 2019-08-29 DIAGNOSIS — R69 Illness, unspecified: Secondary | ICD-10-CM | POA: Diagnosis not present

## 2019-08-29 DIAGNOSIS — G8929 Other chronic pain: Secondary | ICD-10-CM | POA: Diagnosis not present

## 2019-08-30 DIAGNOSIS — G4700 Insomnia, unspecified: Secondary | ICD-10-CM | POA: Diagnosis not present

## 2019-08-30 DIAGNOSIS — Z008 Encounter for other general examination: Secondary | ICD-10-CM | POA: Diagnosis not present

## 2019-08-30 DIAGNOSIS — J449 Chronic obstructive pulmonary disease, unspecified: Secondary | ICD-10-CM | POA: Diagnosis not present

## 2019-08-30 DIAGNOSIS — R69 Illness, unspecified: Secondary | ICD-10-CM | POA: Diagnosis not present

## 2019-08-30 DIAGNOSIS — I509 Heart failure, unspecified: Secondary | ICD-10-CM | POA: Diagnosis not present

## 2019-08-30 DIAGNOSIS — G8929 Other chronic pain: Secondary | ICD-10-CM | POA: Diagnosis not present

## 2019-08-30 DIAGNOSIS — Z6827 Body mass index (BMI) 27.0-27.9, adult: Secondary | ICD-10-CM | POA: Diagnosis not present

## 2019-09-02 DIAGNOSIS — N1831 Chronic kidney disease, stage 3a: Secondary | ICD-10-CM | POA: Diagnosis not present

## 2019-09-02 DIAGNOSIS — I10 Essential (primary) hypertension: Secondary | ICD-10-CM | POA: Diagnosis not present

## 2019-09-02 DIAGNOSIS — J449 Chronic obstructive pulmonary disease, unspecified: Secondary | ICD-10-CM | POA: Diagnosis not present

## 2019-09-02 DIAGNOSIS — I509 Heart failure, unspecified: Secondary | ICD-10-CM | POA: Diagnosis not present

## 2020-01-02 DIAGNOSIS — Z885 Allergy status to narcotic agent status: Secondary | ICD-10-CM | POA: Diagnosis not present

## 2020-01-02 DIAGNOSIS — Z881 Allergy status to other antibiotic agents status: Secondary | ICD-10-CM | POA: Diagnosis not present

## 2020-01-02 DIAGNOSIS — Z882 Allergy status to sulfonamides status: Secondary | ICD-10-CM | POA: Diagnosis not present

## 2020-01-02 DIAGNOSIS — I951 Orthostatic hypotension: Secondary | ICD-10-CM | POA: Diagnosis not present

## 2020-01-02 DIAGNOSIS — Z7982 Long term (current) use of aspirin: Secondary | ICD-10-CM | POA: Diagnosis not present

## 2020-01-02 DIAGNOSIS — D509 Iron deficiency anemia, unspecified: Secondary | ICD-10-CM | POA: Diagnosis not present

## 2020-01-02 DIAGNOSIS — I952 Hypotension due to drugs: Secondary | ICD-10-CM | POA: Diagnosis not present

## 2020-01-02 DIAGNOSIS — K219 Gastro-esophageal reflux disease without esophagitis: Secondary | ICD-10-CM | POA: Diagnosis not present

## 2020-01-02 DIAGNOSIS — I517 Cardiomegaly: Secondary | ICD-10-CM | POA: Diagnosis not present

## 2020-01-02 DIAGNOSIS — Z880 Allergy status to penicillin: Secondary | ICD-10-CM | POA: Diagnosis not present

## 2020-01-02 DIAGNOSIS — I499 Cardiac arrhythmia, unspecified: Secondary | ICD-10-CM | POA: Diagnosis not present

## 2020-01-02 DIAGNOSIS — R001 Bradycardia, unspecified: Secondary | ICD-10-CM | POA: Diagnosis not present

## 2020-01-02 DIAGNOSIS — R531 Weakness: Secondary | ICD-10-CM | POA: Diagnosis not present

## 2020-01-02 DIAGNOSIS — K449 Diaphragmatic hernia without obstruction or gangrene: Secondary | ICD-10-CM | POA: Diagnosis not present

## 2020-01-02 DIAGNOSIS — I5032 Chronic diastolic (congestive) heart failure: Secondary | ICD-10-CM | POA: Diagnosis not present

## 2020-01-02 DIAGNOSIS — Z79899 Other long term (current) drug therapy: Secondary | ICD-10-CM | POA: Diagnosis not present

## 2020-01-02 DIAGNOSIS — R55 Syncope and collapse: Secondary | ICD-10-CM | POA: Diagnosis not present

## 2020-01-02 DIAGNOSIS — I959 Hypotension, unspecified: Secondary | ICD-10-CM | POA: Diagnosis not present

## 2020-01-02 DIAGNOSIS — I110 Hypertensive heart disease with heart failure: Secondary | ICD-10-CM | POA: Diagnosis not present

## 2020-01-02 DIAGNOSIS — M109 Gout, unspecified: Secondary | ICD-10-CM | POA: Diagnosis not present

## 2020-01-02 DIAGNOSIS — R42 Dizziness and giddiness: Secondary | ICD-10-CM | POA: Diagnosis not present

## 2020-01-02 DIAGNOSIS — E785 Hyperlipidemia, unspecified: Secondary | ICD-10-CM | POA: Diagnosis not present

## 2020-01-02 DIAGNOSIS — R079 Chest pain, unspecified: Secondary | ICD-10-CM | POA: Diagnosis not present

## 2020-01-02 DIAGNOSIS — Z8673 Personal history of transient ischemic attack (TIA), and cerebral infarction without residual deficits: Secondary | ICD-10-CM | POA: Diagnosis not present

## 2020-01-02 DIAGNOSIS — I480 Paroxysmal atrial fibrillation: Secondary | ICD-10-CM | POA: Diagnosis not present

## 2020-01-02 DIAGNOSIS — Z888 Allergy status to other drugs, medicaments and biological substances status: Secondary | ICD-10-CM | POA: Diagnosis not present

## 2020-01-02 DIAGNOSIS — Z743 Need for continuous supervision: Secondary | ICD-10-CM | POA: Diagnosis not present

## 2020-01-02 DIAGNOSIS — R0789 Other chest pain: Secondary | ICD-10-CM | POA: Diagnosis not present

## 2020-01-02 DIAGNOSIS — J449 Chronic obstructive pulmonary disease, unspecified: Secondary | ICD-10-CM | POA: Diagnosis not present

## 2020-01-03 DIAGNOSIS — I480 Paroxysmal atrial fibrillation: Secondary | ICD-10-CM | POA: Diagnosis not present

## 2020-01-03 DIAGNOSIS — R079 Chest pain, unspecified: Secondary | ICD-10-CM | POA: Diagnosis not present

## 2020-01-03 DIAGNOSIS — I5032 Chronic diastolic (congestive) heart failure: Secondary | ICD-10-CM | POA: Diagnosis not present

## 2020-01-03 DIAGNOSIS — I952 Hypotension due to drugs: Secondary | ICD-10-CM | POA: Diagnosis not present

## 2020-01-03 DIAGNOSIS — I4891 Unspecified atrial fibrillation: Secondary | ICD-10-CM | POA: Diagnosis not present

## 2020-01-10 DIAGNOSIS — I509 Heart failure, unspecified: Secondary | ICD-10-CM | POA: Diagnosis not present

## 2020-01-10 DIAGNOSIS — J449 Chronic obstructive pulmonary disease, unspecified: Secondary | ICD-10-CM | POA: Diagnosis not present

## 2020-01-10 DIAGNOSIS — Z7689 Persons encountering health services in other specified circumstances: Secondary | ICD-10-CM | POA: Diagnosis not present

## 2020-01-18 ENCOUNTER — Other Ambulatory Visit: Payer: Self-pay | Admitting: *Deleted

## 2020-01-18 NOTE — Patient Outreach (Signed)
[facility]) Care Management  01/18/2020  SHARLENE MCCLUSKEY [birth year] [id number]   Subjective: Telephone call to patient's home  number, no answer, left HIPAA compliant voicemail message, and requested call back.    Objective:Per KPN (Knowledge Performance Now, point of care tool) and chart review, patient has no recent ED visits or hospitalization.  Patient also has a history of COPD, congestive heart failure, hypertension, mixed hyperlipidemia, depression, generalized anxiety disorder, and atrial fibrillation.      Assessment: Received [employer] MD Referral on 01/11/2020.   Referral source: Dr. Marco Collie.  Referral reason: needs help with medication.    Screening  follow up pending patient contact.      Plan: RNCM will send unsuccessful outreach  letter, [facility] pamphlet, will call patient for 2nd telephone outreach attempt within 4 business days, screening follow up, and will proceed with case closure, after 4th unsuccessful outreach call.       Liyanna Cartwright H. Annia Friendly, BSN, [place] [facility] Telephonic CM Phone: [phone] Fax: [phone]

## 2020-01-23 ENCOUNTER — Other Ambulatory Visit: Payer: Self-pay | Admitting: *Deleted

## 2020-01-23 NOTE — Patient Outreach (Signed)
[facility]) Care Management  01/23/2020  Melissa [name] August 27, 1945 [id number]   Subjective: Case discussed with Janalyn Shy at [place] [facility] and verified patient's primary MD office chronic care management status.   Telephone call to patient's home  number, no answer, left HIPAA compliant voicemail message, and requested call back.    Objective:Per KPN (Knowledge Performance Now, point of care tool) and chart review, patient has no recent ED visits or hospitalization.  Patient also has a history of COPD, congestive heart failure, hypertension, mixed hyperlipidemia, depression, generalized anxiety disorder, and atrial fibrillation.      Assessment: Received [employer] MD Referral on 01/11/2020.   Referral source: Dr. Marco Collie.  Referral reason: needs help with medication.    Screening  follow up pending patient contact.      Plan: RNCM has sent unsuccessful outreach  letter, [facility] pamphlet, will call patient for 3rd telephone outreach attempt within 4 business days, screening follow up, and will proceed with case closure, after 4th unsuccessful outreach call.       Loc Feinstein H. Annia Friendly, BSN, [place] [facility] Telephonic CM Phone: [phone] Fax: [phone]

## 2020-01-24 ENCOUNTER — Ambulatory Visit: Payer: Self-pay | Admitting: *Deleted

## 2020-01-26 ENCOUNTER — Other Ambulatory Visit: Payer: Self-pay | Admitting: *Deleted

## 2020-01-26 NOTE — Patient Outreach (Signed)
[facility]) Care Management  01/26/2020  Melissa [name] [birth year] [id number]   Subjective:Telephone call to patient's home number, no answer, left HIPAA compliant voicemail message, and requested call back.    Objective:Per KPN (Knowledge Performance Now, point of care tool) and chart review,patient has no recent ED visits or hospitalization. Patient also has a history of COPD, congestive heart failure, hypertension, mixed hyperlipidemia, depression, generalized anxiety disorder, and atrial fibrillation.     Assessment: Received [facility] MD Referral on 01/11/2020. Referral source: Dr. Marco Collie. Referral reason: needs help with medication. Screening follow up pending patient contact.     Plan:RNCM has sent unsuccessful outreach letter, [facility] pamphlet, will call patient for 4th telephone outreach attempt within 21 business days, screening follow up, and will proceed with case closure, after 4th unsuccessful outreach call.      Melissa [name] H. Annia Friendly, BSN, [place] Management [facility] Telephonic CM Phone: [phone] Fax: [phone]

## 2020-02-03 DIAGNOSIS — I4891 Unspecified atrial fibrillation: Secondary | ICD-10-CM | POA: Diagnosis not present

## 2020-02-03 DIAGNOSIS — J449 Chronic obstructive pulmonary disease, unspecified: Secondary | ICD-10-CM | POA: Diagnosis not present

## 2020-02-03 DIAGNOSIS — I509 Heart failure, unspecified: Secondary | ICD-10-CM | POA: Diagnosis not present

## 2020-02-16 ENCOUNTER — Other Ambulatory Visit: Payer: Self-pay | Admitting: *Deleted

## 2020-02-16 NOTE — Patient Outreach (Signed)
[place]) Care Management  02/16/2020  ARIANI SEIER Sep 29, 1945 [id number]   Subjective:Telephone call to patient's home number, no answer, left HIPAA compliant voicemail message, and requested call back.    Objective:Per KPN (Knowledge Performance Now, point of care tool) and chart review,patient has no recent ED visits or hospitalization. Patient also has a history of COPD, congestive heart failure, hypertension, mixed hyperlipidemia, depression, generalized anxiety disorder, and atrial fibrillation.     Assessment: Received [employer] MD Referral on 01/11/2020. Referral source: Dr. Marco Collie. Referral reason: needs help with medication. Screening follow up not completed due to unable to contact patient and will proceed with case closure.     Plan:Case closure due to unable to reach.  RNCM will send MD case closure letter.        Eulla Kochanowski H. Annia Friendly, BSN, [place] [facility] Telephonic CM Phone: [phone] Fax: [phone]

## 2020-03-05 DIAGNOSIS — I4891 Unspecified atrial fibrillation: Secondary | ICD-10-CM | POA: Diagnosis not present

## 2020-03-05 DIAGNOSIS — I509 Heart failure, unspecified: Secondary | ICD-10-CM | POA: Diagnosis not present

## 2020-04-04 DIAGNOSIS — J449 Chronic obstructive pulmonary disease, unspecified: Secondary | ICD-10-CM | POA: Diagnosis not present

## 2020-04-04 DIAGNOSIS — I509 Heart failure, unspecified: Secondary | ICD-10-CM | POA: Diagnosis not present

## 2020-04-04 DIAGNOSIS — I4891 Unspecified atrial fibrillation: Secondary | ICD-10-CM | POA: Diagnosis not present

## 2020-04-25 DIAGNOSIS — J90 Pleural effusion, not elsewhere classified: Secondary | ICD-10-CM | POA: Diagnosis not present

## 2020-04-25 DIAGNOSIS — I517 Cardiomegaly: Secondary | ICD-10-CM | POA: Diagnosis not present

## 2020-04-25 DIAGNOSIS — I2699 Other pulmonary embolism without acute cor pulmonale: Secondary | ICD-10-CM | POA: Diagnosis not present

## 2020-04-25 DIAGNOSIS — R5383 Other fatigue: Secondary | ICD-10-CM | POA: Diagnosis not present

## 2020-04-25 DIAGNOSIS — K449 Diaphragmatic hernia without obstruction or gangrene: Secondary | ICD-10-CM | POA: Diagnosis not present

## 2020-04-25 DIAGNOSIS — I10 Essential (primary) hypertension: Secondary | ICD-10-CM | POA: Diagnosis not present

## 2020-04-25 DIAGNOSIS — R069 Unspecified abnormalities of breathing: Secondary | ICD-10-CM | POA: Diagnosis not present

## 2020-04-25 DIAGNOSIS — Z743 Need for continuous supervision: Secondary | ICD-10-CM | POA: Diagnosis not present

## 2020-04-25 DIAGNOSIS — D649 Anemia, unspecified: Secondary | ICD-10-CM | POA: Diagnosis not present

## 2020-04-25 DIAGNOSIS — I509 Heart failure, unspecified: Secondary | ICD-10-CM | POA: Diagnosis not present

## 2020-04-25 DIAGNOSIS — N179 Acute kidney failure, unspecified: Secondary | ICD-10-CM | POA: Diagnosis not present

## 2020-04-25 DIAGNOSIS — J9811 Atelectasis: Secondary | ICD-10-CM | POA: Diagnosis not present

## 2020-04-25 DIAGNOSIS — R0602 Shortness of breath: Secondary | ICD-10-CM | POA: Diagnosis not present

## 2020-05-05 DIAGNOSIS — I509 Heart failure, unspecified: Secondary | ICD-10-CM | POA: Diagnosis not present

## 2020-05-05 DIAGNOSIS — J449 Chronic obstructive pulmonary disease, unspecified: Secondary | ICD-10-CM | POA: Diagnosis not present

## 2020-05-05 DIAGNOSIS — I4891 Unspecified atrial fibrillation: Secondary | ICD-10-CM | POA: Diagnosis not present

## 2020-06-07 DIAGNOSIS — J449 Chronic obstructive pulmonary disease, unspecified: Secondary | ICD-10-CM | POA: Diagnosis not present

## 2020-06-07 DIAGNOSIS — N3289 Other specified disorders of bladder: Secondary | ICD-10-CM | POA: Diagnosis not present

## 2020-06-07 DIAGNOSIS — I10 Essential (primary) hypertension: Secondary | ICD-10-CM | POA: Diagnosis not present

## 2020-06-07 DIAGNOSIS — N3946 Mixed incontinence: Secondary | ICD-10-CM | POA: Diagnosis not present

## 2020-06-09 DIAGNOSIS — G8929 Other chronic pain: Secondary | ICD-10-CM | POA: Diagnosis not present

## 2020-06-09 DIAGNOSIS — G4700 Insomnia, unspecified: Secondary | ICD-10-CM | POA: Diagnosis not present

## 2020-06-09 DIAGNOSIS — J449 Chronic obstructive pulmonary disease, unspecified: Secondary | ICD-10-CM | POA: Diagnosis not present

## 2020-06-09 DIAGNOSIS — R69 Illness, unspecified: Secondary | ICD-10-CM | POA: Diagnosis not present

## 2020-06-10 DIAGNOSIS — J449 Chronic obstructive pulmonary disease, unspecified: Secondary | ICD-10-CM | POA: Diagnosis not present

## 2020-06-10 DIAGNOSIS — G8929 Other chronic pain: Secondary | ICD-10-CM | POA: Diagnosis not present

## 2020-06-10 DIAGNOSIS — G4700 Insomnia, unspecified: Secondary | ICD-10-CM | POA: Diagnosis not present

## 2020-06-10 DIAGNOSIS — R69 Illness, unspecified: Secondary | ICD-10-CM | POA: Diagnosis not present

## 2020-06-11 DIAGNOSIS — R69 Illness, unspecified: Secondary | ICD-10-CM | POA: Diagnosis not present

## 2020-06-11 DIAGNOSIS — J449 Chronic obstructive pulmonary disease, unspecified: Secondary | ICD-10-CM | POA: Diagnosis not present

## 2020-06-11 DIAGNOSIS — G4700 Insomnia, unspecified: Secondary | ICD-10-CM | POA: Diagnosis not present

## 2020-06-11 DIAGNOSIS — G8929 Other chronic pain: Secondary | ICD-10-CM | POA: Diagnosis not present

## 2020-06-12 DIAGNOSIS — G4700 Insomnia, unspecified: Secondary | ICD-10-CM | POA: Diagnosis not present

## 2020-06-12 DIAGNOSIS — J449 Chronic obstructive pulmonary disease, unspecified: Secondary | ICD-10-CM | POA: Diagnosis not present

## 2020-06-12 DIAGNOSIS — G8929 Other chronic pain: Secondary | ICD-10-CM | POA: Diagnosis not present

## 2020-06-12 DIAGNOSIS — R69 Illness, unspecified: Secondary | ICD-10-CM | POA: Diagnosis not present

## 2020-06-13 DIAGNOSIS — G4700 Insomnia, unspecified: Secondary | ICD-10-CM | POA: Diagnosis not present

## 2020-06-13 DIAGNOSIS — G8929 Other chronic pain: Secondary | ICD-10-CM | POA: Diagnosis not present

## 2020-06-13 DIAGNOSIS — J449 Chronic obstructive pulmonary disease, unspecified: Secondary | ICD-10-CM | POA: Diagnosis not present

## 2020-06-13 DIAGNOSIS — R69 Illness, unspecified: Secondary | ICD-10-CM | POA: Diagnosis not present

## 2020-06-14 DIAGNOSIS — G4700 Insomnia, unspecified: Secondary | ICD-10-CM | POA: Diagnosis not present

## 2020-06-14 DIAGNOSIS — R69 Illness, unspecified: Secondary | ICD-10-CM | POA: Diagnosis not present

## 2020-06-14 DIAGNOSIS — J449 Chronic obstructive pulmonary disease, unspecified: Secondary | ICD-10-CM | POA: Diagnosis not present

## 2020-06-14 DIAGNOSIS — G8929 Other chronic pain: Secondary | ICD-10-CM | POA: Diagnosis not present

## 2020-06-15 DIAGNOSIS — G8929 Other chronic pain: Secondary | ICD-10-CM | POA: Diagnosis not present

## 2020-06-15 DIAGNOSIS — G4700 Insomnia, unspecified: Secondary | ICD-10-CM | POA: Diagnosis not present

## 2020-06-15 DIAGNOSIS — J449 Chronic obstructive pulmonary disease, unspecified: Secondary | ICD-10-CM | POA: Diagnosis not present

## 2020-06-15 DIAGNOSIS — R69 Illness, unspecified: Secondary | ICD-10-CM | POA: Diagnosis not present

## 2020-06-16 DIAGNOSIS — J449 Chronic obstructive pulmonary disease, unspecified: Secondary | ICD-10-CM | POA: Diagnosis not present

## 2020-06-16 DIAGNOSIS — G4700 Insomnia, unspecified: Secondary | ICD-10-CM | POA: Diagnosis not present

## 2020-06-16 DIAGNOSIS — G8929 Other chronic pain: Secondary | ICD-10-CM | POA: Diagnosis not present

## 2020-06-16 DIAGNOSIS — R69 Illness, unspecified: Secondary | ICD-10-CM | POA: Diagnosis not present

## 2020-06-17 DIAGNOSIS — J449 Chronic obstructive pulmonary disease, unspecified: Secondary | ICD-10-CM | POA: Diagnosis not present

## 2020-06-17 DIAGNOSIS — G4700 Insomnia, unspecified: Secondary | ICD-10-CM | POA: Diagnosis not present

## 2020-06-17 DIAGNOSIS — R69 Illness, unspecified: Secondary | ICD-10-CM | POA: Diagnosis not present

## 2020-06-17 DIAGNOSIS — G8929 Other chronic pain: Secondary | ICD-10-CM | POA: Diagnosis not present

## 2020-06-18 DIAGNOSIS — G8929 Other chronic pain: Secondary | ICD-10-CM | POA: Diagnosis not present

## 2020-06-18 DIAGNOSIS — G4700 Insomnia, unspecified: Secondary | ICD-10-CM | POA: Diagnosis not present

## 2020-06-18 DIAGNOSIS — R69 Illness, unspecified: Secondary | ICD-10-CM | POA: Diagnosis not present

## 2020-06-18 DIAGNOSIS — J449 Chronic obstructive pulmonary disease, unspecified: Secondary | ICD-10-CM | POA: Diagnosis not present

## 2020-06-19 DIAGNOSIS — G8929 Other chronic pain: Secondary | ICD-10-CM | POA: Diagnosis not present

## 2020-06-19 DIAGNOSIS — G4700 Insomnia, unspecified: Secondary | ICD-10-CM | POA: Diagnosis not present

## 2020-06-19 DIAGNOSIS — R69 Illness, unspecified: Secondary | ICD-10-CM | POA: Diagnosis not present

## 2020-06-19 DIAGNOSIS — J449 Chronic obstructive pulmonary disease, unspecified: Secondary | ICD-10-CM | POA: Diagnosis not present

## 2020-06-20 DIAGNOSIS — G4700 Insomnia, unspecified: Secondary | ICD-10-CM | POA: Diagnosis not present

## 2020-06-20 DIAGNOSIS — J449 Chronic obstructive pulmonary disease, unspecified: Secondary | ICD-10-CM | POA: Diagnosis not present

## 2020-06-20 DIAGNOSIS — R69 Illness, unspecified: Secondary | ICD-10-CM | POA: Diagnosis not present

## 2020-06-20 DIAGNOSIS — G8929 Other chronic pain: Secondary | ICD-10-CM | POA: Diagnosis not present

## 2020-06-21 DIAGNOSIS — R69 Illness, unspecified: Secondary | ICD-10-CM | POA: Diagnosis not present

## 2020-06-21 DIAGNOSIS — J449 Chronic obstructive pulmonary disease, unspecified: Secondary | ICD-10-CM | POA: Diagnosis not present

## 2020-06-21 DIAGNOSIS — G8929 Other chronic pain: Secondary | ICD-10-CM | POA: Diagnosis not present

## 2020-06-21 DIAGNOSIS — G4700 Insomnia, unspecified: Secondary | ICD-10-CM | POA: Diagnosis not present

## 2020-06-22 DIAGNOSIS — R69 Illness, unspecified: Secondary | ICD-10-CM | POA: Diagnosis not present

## 2020-06-22 DIAGNOSIS — J449 Chronic obstructive pulmonary disease, unspecified: Secondary | ICD-10-CM | POA: Diagnosis not present

## 2020-06-22 DIAGNOSIS — G4700 Insomnia, unspecified: Secondary | ICD-10-CM | POA: Diagnosis not present

## 2020-06-22 DIAGNOSIS — G8929 Other chronic pain: Secondary | ICD-10-CM | POA: Diagnosis not present

## 2020-06-23 DIAGNOSIS — G8929 Other chronic pain: Secondary | ICD-10-CM | POA: Diagnosis not present

## 2020-06-23 DIAGNOSIS — J449 Chronic obstructive pulmonary disease, unspecified: Secondary | ICD-10-CM | POA: Diagnosis not present

## 2020-06-23 DIAGNOSIS — G4700 Insomnia, unspecified: Secondary | ICD-10-CM | POA: Diagnosis not present

## 2020-06-23 DIAGNOSIS — R69 Illness, unspecified: Secondary | ICD-10-CM | POA: Diagnosis not present

## 2020-06-24 DIAGNOSIS — G8929 Other chronic pain: Secondary | ICD-10-CM | POA: Diagnosis not present

## 2020-06-24 DIAGNOSIS — G4700 Insomnia, unspecified: Secondary | ICD-10-CM | POA: Diagnosis not present

## 2020-06-24 DIAGNOSIS — J449 Chronic obstructive pulmonary disease, unspecified: Secondary | ICD-10-CM | POA: Diagnosis not present

## 2020-06-24 DIAGNOSIS — R69 Illness, unspecified: Secondary | ICD-10-CM | POA: Diagnosis not present

## 2020-06-25 DIAGNOSIS — G8929 Other chronic pain: Secondary | ICD-10-CM | POA: Diagnosis not present

## 2020-06-25 DIAGNOSIS — J449 Chronic obstructive pulmonary disease, unspecified: Secondary | ICD-10-CM | POA: Diagnosis not present

## 2020-06-25 DIAGNOSIS — R69 Illness, unspecified: Secondary | ICD-10-CM | POA: Diagnosis not present

## 2020-06-25 DIAGNOSIS — G4700 Insomnia, unspecified: Secondary | ICD-10-CM | POA: Diagnosis not present

## 2020-06-26 DIAGNOSIS — R69 Illness, unspecified: Secondary | ICD-10-CM | POA: Diagnosis not present

## 2020-06-26 DIAGNOSIS — J449 Chronic obstructive pulmonary disease, unspecified: Secondary | ICD-10-CM | POA: Diagnosis not present

## 2020-06-26 DIAGNOSIS — G4700 Insomnia, unspecified: Secondary | ICD-10-CM | POA: Diagnosis not present

## 2020-06-26 DIAGNOSIS — G8929 Other chronic pain: Secondary | ICD-10-CM | POA: Diagnosis not present

## 2020-06-27 DIAGNOSIS — J449 Chronic obstructive pulmonary disease, unspecified: Secondary | ICD-10-CM | POA: Diagnosis not present

## 2020-06-27 DIAGNOSIS — G4700 Insomnia, unspecified: Secondary | ICD-10-CM | POA: Diagnosis not present

## 2020-06-27 DIAGNOSIS — G8929 Other chronic pain: Secondary | ICD-10-CM | POA: Diagnosis not present

## 2020-06-27 DIAGNOSIS — R69 Illness, unspecified: Secondary | ICD-10-CM | POA: Diagnosis not present

## 2020-06-28 DIAGNOSIS — G8929 Other chronic pain: Secondary | ICD-10-CM | POA: Diagnosis not present

## 2020-06-28 DIAGNOSIS — G4700 Insomnia, unspecified: Secondary | ICD-10-CM | POA: Diagnosis not present

## 2020-06-28 DIAGNOSIS — J449 Chronic obstructive pulmonary disease, unspecified: Secondary | ICD-10-CM | POA: Diagnosis not present

## 2020-06-28 DIAGNOSIS — R69 Illness, unspecified: Secondary | ICD-10-CM | POA: Diagnosis not present

## 2020-06-29 DIAGNOSIS — J449 Chronic obstructive pulmonary disease, unspecified: Secondary | ICD-10-CM | POA: Diagnosis not present

## 2020-06-29 DIAGNOSIS — G4700 Insomnia, unspecified: Secondary | ICD-10-CM | POA: Diagnosis not present

## 2020-06-29 DIAGNOSIS — R69 Illness, unspecified: Secondary | ICD-10-CM | POA: Diagnosis not present

## 2020-06-29 DIAGNOSIS — G8929 Other chronic pain: Secondary | ICD-10-CM | POA: Diagnosis not present

## 2020-06-30 DIAGNOSIS — J449 Chronic obstructive pulmonary disease, unspecified: Secondary | ICD-10-CM | POA: Diagnosis not present

## 2020-06-30 DIAGNOSIS — R69 Illness, unspecified: Secondary | ICD-10-CM | POA: Diagnosis not present

## 2020-06-30 DIAGNOSIS — G4700 Insomnia, unspecified: Secondary | ICD-10-CM | POA: Diagnosis not present

## 2020-06-30 DIAGNOSIS — G8929 Other chronic pain: Secondary | ICD-10-CM | POA: Diagnosis not present

## 2020-07-01 DIAGNOSIS — R69 Illness, unspecified: Secondary | ICD-10-CM | POA: Diagnosis not present

## 2020-07-01 DIAGNOSIS — G8929 Other chronic pain: Secondary | ICD-10-CM | POA: Diagnosis not present

## 2020-07-01 DIAGNOSIS — J449 Chronic obstructive pulmonary disease, unspecified: Secondary | ICD-10-CM | POA: Diagnosis not present

## 2020-07-01 DIAGNOSIS — G4700 Insomnia, unspecified: Secondary | ICD-10-CM | POA: Diagnosis not present

## 2020-07-02 DIAGNOSIS — R69 Illness, unspecified: Secondary | ICD-10-CM | POA: Diagnosis not present

## 2020-07-02 DIAGNOSIS — G8929 Other chronic pain: Secondary | ICD-10-CM | POA: Diagnosis not present

## 2020-07-02 DIAGNOSIS — J449 Chronic obstructive pulmonary disease, unspecified: Secondary | ICD-10-CM | POA: Diagnosis not present

## 2020-07-02 DIAGNOSIS — G4700 Insomnia, unspecified: Secondary | ICD-10-CM | POA: Diagnosis not present

## 2020-07-03 DIAGNOSIS — R69 Illness, unspecified: Secondary | ICD-10-CM | POA: Diagnosis not present

## 2020-07-03 DIAGNOSIS — G8929 Other chronic pain: Secondary | ICD-10-CM | POA: Diagnosis not present

## 2020-07-03 DIAGNOSIS — J449 Chronic obstructive pulmonary disease, unspecified: Secondary | ICD-10-CM | POA: Diagnosis not present

## 2020-07-03 DIAGNOSIS — G4700 Insomnia, unspecified: Secondary | ICD-10-CM | POA: Diagnosis not present

## 2020-07-04 DIAGNOSIS — R69 Illness, unspecified: Secondary | ICD-10-CM | POA: Diagnosis not present

## 2020-07-04 DIAGNOSIS — J449 Chronic obstructive pulmonary disease, unspecified: Secondary | ICD-10-CM | POA: Diagnosis not present

## 2020-07-04 DIAGNOSIS — G8929 Other chronic pain: Secondary | ICD-10-CM | POA: Diagnosis not present

## 2020-07-04 DIAGNOSIS — G4700 Insomnia, unspecified: Secondary | ICD-10-CM | POA: Diagnosis not present

## 2020-07-05 DIAGNOSIS — J449 Chronic obstructive pulmonary disease, unspecified: Secondary | ICD-10-CM | POA: Diagnosis not present

## 2020-07-05 DIAGNOSIS — R69 Illness, unspecified: Secondary | ICD-10-CM | POA: Diagnosis not present

## 2020-07-05 DIAGNOSIS — G4700 Insomnia, unspecified: Secondary | ICD-10-CM | POA: Diagnosis not present

## 2020-07-05 DIAGNOSIS — G8929 Other chronic pain: Secondary | ICD-10-CM | POA: Diagnosis not present

## 2020-10-09 DIAGNOSIS — I509 Heart failure, unspecified: Secondary | ICD-10-CM | POA: Diagnosis not present

## 2020-11-04 DIAGNOSIS — R110 Nausea: Secondary | ICD-10-CM | POA: Diagnosis not present

## 2020-11-04 DIAGNOSIS — Z743 Need for continuous supervision: Secondary | ICD-10-CM | POA: Diagnosis not present

## 2020-11-04 DIAGNOSIS — I700 Atherosclerosis of aorta: Secondary | ICD-10-CM | POA: Diagnosis not present

## 2020-11-04 DIAGNOSIS — N3000 Acute cystitis without hematuria: Secondary | ICD-10-CM | POA: Diagnosis not present

## 2020-11-04 DIAGNOSIS — D649 Anemia, unspecified: Secondary | ICD-10-CM | POA: Diagnosis not present

## 2020-11-04 DIAGNOSIS — N281 Cyst of kidney, acquired: Secondary | ICD-10-CM | POA: Diagnosis not present

## 2020-11-04 DIAGNOSIS — R109 Unspecified abdominal pain: Secondary | ICD-10-CM | POA: Diagnosis not present

## 2020-11-04 DIAGNOSIS — I499 Cardiac arrhythmia, unspecified: Secondary | ICD-10-CM | POA: Diagnosis not present

## 2020-11-04 DIAGNOSIS — K449 Diaphragmatic hernia without obstruction or gangrene: Secondary | ICD-10-CM | POA: Diagnosis not present

## 2020-11-04 DIAGNOSIS — Z9049 Acquired absence of other specified parts of digestive tract: Secondary | ICD-10-CM | POA: Diagnosis not present

## 2020-11-12 DIAGNOSIS — Z743 Need for continuous supervision: Secondary | ICD-10-CM | POA: Diagnosis not present

## 2020-11-12 DIAGNOSIS — R110 Nausea: Secondary | ICD-10-CM | POA: Diagnosis not present

## 2020-11-12 DIAGNOSIS — M549 Dorsalgia, unspecified: Secondary | ICD-10-CM | POA: Diagnosis not present

## 2020-12-21 DIAGNOSIS — E785 Hyperlipidemia, unspecified: Secondary | ICD-10-CM | POA: Diagnosis not present

## 2020-12-28 DIAGNOSIS — R0602 Shortness of breath: Secondary | ICD-10-CM | POA: Diagnosis not present

## 2020-12-28 DIAGNOSIS — J9811 Atelectasis: Secondary | ICD-10-CM | POA: Diagnosis not present

## 2020-12-28 DIAGNOSIS — I517 Cardiomegaly: Secondary | ICD-10-CM | POA: Diagnosis not present

## 2020-12-28 DIAGNOSIS — E785 Hyperlipidemia, unspecified: Secondary | ICD-10-CM | POA: Diagnosis not present

## 2020-12-28 DIAGNOSIS — I10 Essential (primary) hypertension: Secondary | ICD-10-CM | POA: Diagnosis not present

## 2020-12-28 DIAGNOSIS — R531 Weakness: Secondary | ICD-10-CM | POA: Diagnosis not present

## 2020-12-28 DIAGNOSIS — D649 Anemia, unspecified: Secondary | ICD-10-CM | POA: Diagnosis not present

## 2020-12-28 DIAGNOSIS — K449 Diaphragmatic hernia without obstruction or gangrene: Secondary | ICD-10-CM | POA: Diagnosis not present

## 2021-04-10 DIAGNOSIS — Z9049 Acquired absence of other specified parts of digestive tract: Secondary | ICD-10-CM | POA: Diagnosis not present

## 2021-04-10 DIAGNOSIS — D649 Anemia, unspecified: Secondary | ICD-10-CM | POA: Diagnosis not present

## 2021-04-10 DIAGNOSIS — Z8719 Personal history of other diseases of the digestive system: Secondary | ICD-10-CM | POA: Diagnosis not present

## 2021-04-10 DIAGNOSIS — R109 Unspecified abdominal pain: Secondary | ICD-10-CM | POA: Diagnosis not present

## 2021-04-10 DIAGNOSIS — Z791 Long term (current) use of non-steroidal anti-inflammatories (NSAID): Secondary | ICD-10-CM | POA: Diagnosis not present

## 2021-04-10 DIAGNOSIS — J90 Pleural effusion, not elsewhere classified: Secondary | ICD-10-CM | POA: Diagnosis not present

## 2021-04-10 DIAGNOSIS — I517 Cardiomegaly: Secondary | ICD-10-CM | POA: Diagnosis not present

## 2021-04-10 DIAGNOSIS — R0602 Shortness of breath: Secondary | ICD-10-CM | POA: Diagnosis not present

## 2021-04-10 DIAGNOSIS — K449 Diaphragmatic hernia without obstruction or gangrene: Secondary | ICD-10-CM | POA: Diagnosis not present

## 2021-04-10 DIAGNOSIS — R0789 Other chest pain: Secondary | ICD-10-CM | POA: Diagnosis not present

## 2021-04-10 DIAGNOSIS — R079 Chest pain, unspecified: Secondary | ICD-10-CM | POA: Diagnosis not present

## 2021-04-10 DIAGNOSIS — J449 Chronic obstructive pulmonary disease, unspecified: Secondary | ICD-10-CM | POA: Diagnosis not present

## 2021-04-11 DIAGNOSIS — I499 Cardiac arrhythmia, unspecified: Secondary | ICD-10-CM | POA: Diagnosis not present

## 2021-04-11 DIAGNOSIS — Z9049 Acquired absence of other specified parts of digestive tract: Secondary | ICD-10-CM | POA: Diagnosis not present

## 2021-04-11 DIAGNOSIS — R109 Unspecified abdominal pain: Secondary | ICD-10-CM | POA: Diagnosis not present

## 2021-04-11 DIAGNOSIS — I5031 Acute diastolic (congestive) heart failure: Secondary | ICD-10-CM | POA: Diagnosis not present

## 2021-04-11 DIAGNOSIS — R6889 Other general symptoms and signs: Secondary | ICD-10-CM | POA: Diagnosis not present

## 2021-04-11 DIAGNOSIS — D649 Anemia, unspecified: Secondary | ICD-10-CM | POA: Diagnosis not present

## 2021-04-11 DIAGNOSIS — R0602 Shortness of breath: Secondary | ICD-10-CM | POA: Diagnosis not present

## 2021-04-11 DIAGNOSIS — Z0000 Encounter for general adult medical examination without abnormal findings: Secondary | ICD-10-CM | POA: Diagnosis not present

## 2021-04-11 DIAGNOSIS — Z8719 Personal history of other diseases of the digestive system: Secondary | ICD-10-CM | POA: Diagnosis not present

## 2021-04-11 DIAGNOSIS — I517 Cardiomegaly: Secondary | ICD-10-CM | POA: Diagnosis not present

## 2021-04-11 DIAGNOSIS — R079 Chest pain, unspecified: Secondary | ICD-10-CM | POA: Diagnosis not present

## 2021-04-11 DIAGNOSIS — Z743 Need for continuous supervision: Secondary | ICD-10-CM | POA: Diagnosis not present

## 2021-04-11 DIAGNOSIS — Z139 Encounter for screening, unspecified: Secondary | ICD-10-CM | POA: Diagnosis not present

## 2021-04-11 DIAGNOSIS — Z136 Encounter for screening for cardiovascular disorders: Secondary | ICD-10-CM | POA: Diagnosis not present

## 2021-04-11 DIAGNOSIS — J90 Pleural effusion, not elsewhere classified: Secondary | ICD-10-CM | POA: Diagnosis not present

## 2021-04-11 DIAGNOSIS — R0789 Other chest pain: Secondary | ICD-10-CM | POA: Diagnosis not present

## 2021-04-11 DIAGNOSIS — K449 Diaphragmatic hernia without obstruction or gangrene: Secondary | ICD-10-CM | POA: Diagnosis not present

## 2021-04-13 DIAGNOSIS — K2950 Unspecified chronic gastritis without bleeding: Secondary | ICD-10-CM | POA: Diagnosis not present

## 2021-04-13 DIAGNOSIS — R5381 Other malaise: Secondary | ICD-10-CM | POA: Diagnosis not present

## 2021-04-13 DIAGNOSIS — K922 Gastrointestinal hemorrhage, unspecified: Secondary | ICD-10-CM | POA: Diagnosis not present

## 2021-04-13 DIAGNOSIS — J449 Chronic obstructive pulmonary disease, unspecified: Secondary | ICD-10-CM | POA: Diagnosis not present

## 2021-04-13 DIAGNOSIS — K449 Diaphragmatic hernia without obstruction or gangrene: Secondary | ICD-10-CM | POA: Diagnosis not present

## 2021-04-13 DIAGNOSIS — K648 Other hemorrhoids: Secondary | ICD-10-CM | POA: Diagnosis not present

## 2021-04-13 DIAGNOSIS — I110 Hypertensive heart disease with heart failure: Secondary | ICD-10-CM | POA: Diagnosis not present

## 2021-04-13 DIAGNOSIS — D509 Iron deficiency anemia, unspecified: Secondary | ICD-10-CM | POA: Diagnosis not present

## 2021-04-13 DIAGNOSIS — Z8673 Personal history of transient ischemic attack (TIA), and cerebral infarction without residual deficits: Secondary | ICD-10-CM | POA: Diagnosis not present

## 2021-04-13 DIAGNOSIS — D125 Benign neoplasm of sigmoid colon: Secondary | ICD-10-CM | POA: Diagnosis not present

## 2021-04-13 DIAGNOSIS — I509 Heart failure, unspecified: Secondary | ICD-10-CM | POA: Diagnosis not present

## 2021-04-13 DIAGNOSIS — Z79899 Other long term (current) drug therapy: Secondary | ICD-10-CM | POA: Diagnosis not present

## 2021-04-13 DIAGNOSIS — K921 Melena: Secondary | ICD-10-CM | POA: Diagnosis not present

## 2021-04-13 DIAGNOSIS — Z8711 Personal history of peptic ulcer disease: Secondary | ICD-10-CM | POA: Diagnosis not present

## 2021-04-13 DIAGNOSIS — D120 Benign neoplasm of cecum: Secondary | ICD-10-CM | POA: Diagnosis not present

## 2021-04-14 DIAGNOSIS — Z8673 Personal history of transient ischemic attack (TIA), and cerebral infarction without residual deficits: Secondary | ICD-10-CM | POA: Diagnosis not present

## 2021-04-14 DIAGNOSIS — K2950 Unspecified chronic gastritis without bleeding: Secondary | ICD-10-CM | POA: Diagnosis not present

## 2021-04-14 DIAGNOSIS — J449 Chronic obstructive pulmonary disease, unspecified: Secondary | ICD-10-CM | POA: Diagnosis not present

## 2021-04-14 DIAGNOSIS — K922 Gastrointestinal hemorrhage, unspecified: Secondary | ICD-10-CM | POA: Diagnosis not present

## 2021-04-14 DIAGNOSIS — Z79899 Other long term (current) drug therapy: Secondary | ICD-10-CM | POA: Diagnosis not present

## 2021-04-14 DIAGNOSIS — D120 Benign neoplasm of cecum: Secondary | ICD-10-CM | POA: Diagnosis not present

## 2021-04-14 DIAGNOSIS — I110 Hypertensive heart disease with heart failure: Secondary | ICD-10-CM | POA: Diagnosis not present

## 2021-04-14 DIAGNOSIS — D509 Iron deficiency anemia, unspecified: Secondary | ICD-10-CM | POA: Diagnosis not present

## 2021-04-14 DIAGNOSIS — R5381 Other malaise: Secondary | ICD-10-CM | POA: Diagnosis not present

## 2021-04-14 DIAGNOSIS — K648 Other hemorrhoids: Secondary | ICD-10-CM | POA: Diagnosis not present

## 2021-04-14 DIAGNOSIS — K449 Diaphragmatic hernia without obstruction or gangrene: Secondary | ICD-10-CM | POA: Diagnosis not present

## 2021-04-14 DIAGNOSIS — K920 Hematemesis: Secondary | ICD-10-CM | POA: Diagnosis not present

## 2021-04-14 DIAGNOSIS — D125 Benign neoplasm of sigmoid colon: Secondary | ICD-10-CM | POA: Diagnosis not present

## 2021-04-14 DIAGNOSIS — I509 Heart failure, unspecified: Secondary | ICD-10-CM | POA: Diagnosis not present

## 2021-04-14 DIAGNOSIS — Z8711 Personal history of peptic ulcer disease: Secondary | ICD-10-CM | POA: Diagnosis not present

## 2021-04-14 DIAGNOSIS — K921 Melena: Secondary | ICD-10-CM | POA: Diagnosis not present

## 2021-04-15 DIAGNOSIS — D125 Benign neoplasm of sigmoid colon: Secondary | ICD-10-CM | POA: Diagnosis not present

## 2021-04-15 DIAGNOSIS — I509 Heart failure, unspecified: Secondary | ICD-10-CM | POA: Diagnosis not present

## 2021-04-15 DIAGNOSIS — E785 Hyperlipidemia, unspecified: Secondary | ICD-10-CM | POA: Diagnosis not present

## 2021-04-15 DIAGNOSIS — K449 Diaphragmatic hernia without obstruction or gangrene: Secondary | ICD-10-CM | POA: Diagnosis not present

## 2021-04-15 DIAGNOSIS — K921 Melena: Secondary | ICD-10-CM | POA: Diagnosis not present

## 2021-04-15 DIAGNOSIS — D649 Anemia, unspecified: Secondary | ICD-10-CM | POA: Diagnosis not present

## 2021-04-15 DIAGNOSIS — J449 Chronic obstructive pulmonary disease, unspecified: Secondary | ICD-10-CM | POA: Diagnosis not present

## 2021-04-15 DIAGNOSIS — Z8711 Personal history of peptic ulcer disease: Secondary | ICD-10-CM | POA: Diagnosis not present

## 2021-04-15 DIAGNOSIS — K648 Other hemorrhoids: Secondary | ICD-10-CM | POA: Diagnosis not present

## 2021-04-15 DIAGNOSIS — Z8673 Personal history of transient ischemic attack (TIA), and cerebral infarction without residual deficits: Secondary | ICD-10-CM | POA: Diagnosis not present

## 2021-04-15 DIAGNOSIS — I110 Hypertensive heart disease with heart failure: Secondary | ICD-10-CM | POA: Diagnosis not present

## 2021-04-15 DIAGNOSIS — I4891 Unspecified atrial fibrillation: Secondary | ICD-10-CM | POA: Diagnosis not present

## 2021-04-15 DIAGNOSIS — R5381 Other malaise: Secondary | ICD-10-CM | POA: Diagnosis not present

## 2021-04-15 DIAGNOSIS — Z79899 Other long term (current) drug therapy: Secondary | ICD-10-CM | POA: Diagnosis not present

## 2021-04-15 DIAGNOSIS — D120 Benign neoplasm of cecum: Secondary | ICD-10-CM | POA: Diagnosis not present

## 2021-04-15 DIAGNOSIS — K2970 Gastritis, unspecified, without bleeding: Secondary | ICD-10-CM | POA: Diagnosis not present

## 2021-04-15 DIAGNOSIS — K2950 Unspecified chronic gastritis without bleeding: Secondary | ICD-10-CM | POA: Diagnosis not present

## 2021-04-15 DIAGNOSIS — K922 Gastrointestinal hemorrhage, unspecified: Secondary | ICD-10-CM | POA: Diagnosis not present

## 2021-04-15 DIAGNOSIS — D509 Iron deficiency anemia, unspecified: Secondary | ICD-10-CM | POA: Diagnosis not present

## 2021-04-15 DIAGNOSIS — I10 Essential (primary) hypertension: Secondary | ICD-10-CM | POA: Diagnosis not present

## 2021-04-16 DIAGNOSIS — K921 Melena: Secondary | ICD-10-CM | POA: Diagnosis not present

## 2021-04-16 DIAGNOSIS — D122 Benign neoplasm of ascending colon: Secondary | ICD-10-CM | POA: Diagnosis not present

## 2021-04-16 DIAGNOSIS — D509 Iron deficiency anemia, unspecified: Secondary | ICD-10-CM | POA: Diagnosis not present

## 2021-04-16 DIAGNOSIS — I509 Heart failure, unspecified: Secondary | ICD-10-CM | POA: Diagnosis not present

## 2021-04-16 DIAGNOSIS — Z8711 Personal history of peptic ulcer disease: Secondary | ICD-10-CM | POA: Diagnosis not present

## 2021-04-16 DIAGNOSIS — I110 Hypertensive heart disease with heart failure: Secondary | ICD-10-CM | POA: Diagnosis not present

## 2021-04-16 DIAGNOSIS — R5381 Other malaise: Secondary | ICD-10-CM | POA: Diagnosis not present

## 2021-04-16 DIAGNOSIS — J449 Chronic obstructive pulmonary disease, unspecified: Secondary | ICD-10-CM | POA: Diagnosis not present

## 2021-04-16 DIAGNOSIS — K648 Other hemorrhoids: Secondary | ICD-10-CM | POA: Diagnosis not present

## 2021-04-16 DIAGNOSIS — K2950 Unspecified chronic gastritis without bleeding: Secondary | ICD-10-CM | POA: Diagnosis not present

## 2021-04-16 DIAGNOSIS — Z8673 Personal history of transient ischemic attack (TIA), and cerebral infarction without residual deficits: Secondary | ICD-10-CM | POA: Diagnosis not present

## 2021-04-16 DIAGNOSIS — K635 Polyp of colon: Secondary | ICD-10-CM | POA: Diagnosis not present

## 2021-04-16 DIAGNOSIS — K449 Diaphragmatic hernia without obstruction or gangrene: Secondary | ICD-10-CM | POA: Diagnosis not present

## 2021-04-16 DIAGNOSIS — I10 Essential (primary) hypertension: Secondary | ICD-10-CM | POA: Diagnosis not present

## 2021-04-16 DIAGNOSIS — D125 Benign neoplasm of sigmoid colon: Secondary | ICD-10-CM | POA: Diagnosis not present

## 2021-04-16 DIAGNOSIS — D120 Benign neoplasm of cecum: Secondary | ICD-10-CM | POA: Diagnosis not present

## 2021-04-16 DIAGNOSIS — D500 Iron deficiency anemia secondary to blood loss (chronic): Secondary | ICD-10-CM | POA: Diagnosis not present

## 2021-04-16 DIAGNOSIS — K922 Gastrointestinal hemorrhage, unspecified: Secondary | ICD-10-CM | POA: Diagnosis not present

## 2021-04-16 DIAGNOSIS — Z79899 Other long term (current) drug therapy: Secondary | ICD-10-CM | POA: Diagnosis not present

## 2021-04-17 DIAGNOSIS — K2950 Unspecified chronic gastritis without bleeding: Secondary | ICD-10-CM | POA: Diagnosis not present

## 2021-04-17 DIAGNOSIS — K648 Other hemorrhoids: Secondary | ICD-10-CM | POA: Diagnosis not present

## 2021-04-17 DIAGNOSIS — K922 Gastrointestinal hemorrhage, unspecified: Secondary | ICD-10-CM | POA: Diagnosis not present

## 2021-04-17 DIAGNOSIS — Z79899 Other long term (current) drug therapy: Secondary | ICD-10-CM | POA: Diagnosis not present

## 2021-04-17 DIAGNOSIS — K449 Diaphragmatic hernia without obstruction or gangrene: Secondary | ICD-10-CM | POA: Diagnosis not present

## 2021-04-17 DIAGNOSIS — Z8673 Personal history of transient ischemic attack (TIA), and cerebral infarction without residual deficits: Secondary | ICD-10-CM | POA: Diagnosis not present

## 2021-04-17 DIAGNOSIS — K2970 Gastritis, unspecified, without bleeding: Secondary | ICD-10-CM | POA: Diagnosis not present

## 2021-04-17 DIAGNOSIS — J449 Chronic obstructive pulmonary disease, unspecified: Secondary | ICD-10-CM | POA: Diagnosis not present

## 2021-04-17 DIAGNOSIS — I110 Hypertensive heart disease with heart failure: Secondary | ICD-10-CM | POA: Diagnosis not present

## 2021-04-17 DIAGNOSIS — D125 Benign neoplasm of sigmoid colon: Secondary | ICD-10-CM | POA: Diagnosis not present

## 2021-04-17 DIAGNOSIS — Z8711 Personal history of peptic ulcer disease: Secondary | ICD-10-CM | POA: Diagnosis not present

## 2021-04-17 DIAGNOSIS — R5381 Other malaise: Secondary | ICD-10-CM | POA: Diagnosis not present

## 2021-04-17 DIAGNOSIS — D509 Iron deficiency anemia, unspecified: Secondary | ICD-10-CM | POA: Diagnosis not present

## 2021-04-17 DIAGNOSIS — K921 Melena: Secondary | ICD-10-CM | POA: Diagnosis not present

## 2021-04-17 DIAGNOSIS — I509 Heart failure, unspecified: Secondary | ICD-10-CM | POA: Diagnosis not present

## 2021-04-17 DIAGNOSIS — D120 Benign neoplasm of cecum: Secondary | ICD-10-CM | POA: Diagnosis not present

## 2021-04-17 DIAGNOSIS — D122 Benign neoplasm of ascending colon: Secondary | ICD-10-CM | POA: Diagnosis not present

## 2021-04-17 DIAGNOSIS — I10 Essential (primary) hypertension: Secondary | ICD-10-CM | POA: Diagnosis not present

## 2021-04-18 DIAGNOSIS — I509 Heart failure, unspecified: Secondary | ICD-10-CM | POA: Diagnosis not present

## 2021-04-18 DIAGNOSIS — E7800 Pure hypercholesterolemia, unspecified: Secondary | ICD-10-CM | POA: Diagnosis not present

## 2021-04-18 DIAGNOSIS — D509 Iron deficiency anemia, unspecified: Secondary | ICD-10-CM | POA: Diagnosis not present

## 2021-04-18 DIAGNOSIS — K279 Peptic ulcer, site unspecified, unspecified as acute or chronic, without hemorrhage or perforation: Secondary | ICD-10-CM | POA: Diagnosis not present

## 2021-04-18 DIAGNOSIS — I110 Hypertensive heart disease with heart failure: Secondary | ICD-10-CM | POA: Diagnosis not present

## 2021-04-28 DIAGNOSIS — M549 Dorsalgia, unspecified: Secondary | ICD-10-CM | POA: Diagnosis not present

## 2021-04-28 DIAGNOSIS — I700 Atherosclerosis of aorta: Secondary | ICD-10-CM | POA: Diagnosis not present

## 2021-04-28 DIAGNOSIS — Z743 Need for continuous supervision: Secondary | ICD-10-CM | POA: Diagnosis not present

## 2021-04-28 DIAGNOSIS — T148XXA Other injury of unspecified body region, initial encounter: Secondary | ICD-10-CM | POA: Diagnosis not present

## 2021-04-28 DIAGNOSIS — R6889 Other general symptoms and signs: Secondary | ICD-10-CM | POA: Diagnosis not present

## 2021-04-28 DIAGNOSIS — K449 Diaphragmatic hernia without obstruction or gangrene: Secondary | ICD-10-CM | POA: Diagnosis not present

## 2021-04-28 DIAGNOSIS — Z9049 Acquired absence of other specified parts of digestive tract: Secondary | ICD-10-CM | POA: Diagnosis not present

## 2021-04-28 DIAGNOSIS — M5416 Radiculopathy, lumbar region: Secondary | ICD-10-CM | POA: Diagnosis not present

## 2021-06-12 DIAGNOSIS — E7800 Pure hypercholesterolemia, unspecified: Secondary | ICD-10-CM | POA: Diagnosis not present

## 2021-06-12 DIAGNOSIS — I509 Heart failure, unspecified: Secondary | ICD-10-CM | POA: Diagnosis not present

## 2021-06-12 DIAGNOSIS — I110 Hypertensive heart disease with heart failure: Secondary | ICD-10-CM | POA: Diagnosis not present

## 2021-06-12 DIAGNOSIS — K279 Peptic ulcer, site unspecified, unspecified as acute or chronic, without hemorrhage or perforation: Secondary | ICD-10-CM | POA: Diagnosis not present

## 2021-06-12 DIAGNOSIS — D509 Iron deficiency anemia, unspecified: Secondary | ICD-10-CM | POA: Diagnosis not present

## 2021-06-14 DIAGNOSIS — D649 Anemia, unspecified: Secondary | ICD-10-CM | POA: Diagnosis not present

## 2021-06-14 DIAGNOSIS — I10 Essential (primary) hypertension: Secondary | ICD-10-CM | POA: Diagnosis not present

## 2021-06-14 DIAGNOSIS — I5032 Chronic diastolic (congestive) heart failure: Secondary | ICD-10-CM | POA: Diagnosis not present

## 2021-08-18 DIAGNOSIS — D649 Anemia, unspecified: Secondary | ICD-10-CM | POA: Diagnosis not present

## 2021-08-18 DIAGNOSIS — K5900 Constipation, unspecified: Secondary | ICD-10-CM | POA: Diagnosis not present

## 2021-08-18 DIAGNOSIS — I10 Essential (primary) hypertension: Secondary | ICD-10-CM | POA: Diagnosis not present

## 2021-08-18 DIAGNOSIS — Z743 Need for continuous supervision: Secondary | ICD-10-CM | POA: Diagnosis not present

## 2021-08-18 DIAGNOSIS — R109 Unspecified abdominal pain: Secondary | ICD-10-CM | POA: Diagnosis not present

## 2021-08-18 DIAGNOSIS — I700 Atherosclerosis of aorta: Secondary | ICD-10-CM | POA: Diagnosis not present

## 2021-08-18 DIAGNOSIS — R1084 Generalized abdominal pain: Secondary | ICD-10-CM | POA: Diagnosis not present

## 2021-08-27 DIAGNOSIS — I4891 Unspecified atrial fibrillation: Secondary | ICD-10-CM | POA: Diagnosis not present

## 2021-08-27 DIAGNOSIS — D649 Anemia, unspecified: Secondary | ICD-10-CM | POA: Diagnosis not present

## 2021-08-27 DIAGNOSIS — M25551 Pain in right hip: Secondary | ICD-10-CM | POA: Diagnosis not present

## 2021-11-23 DIAGNOSIS — E785 Hyperlipidemia, unspecified: Secondary | ICD-10-CM | POA: Diagnosis not present

## 2021-11-23 DIAGNOSIS — K226 Gastro-esophageal laceration-hemorrhage syndrome: Secondary | ICD-10-CM | POA: Diagnosis not present

## 2021-11-23 DIAGNOSIS — I2510 Atherosclerotic heart disease of native coronary artery without angina pectoris: Secondary | ICD-10-CM | POA: Diagnosis not present

## 2021-11-23 DIAGNOSIS — R6889 Other general symptoms and signs: Secondary | ICD-10-CM | POA: Diagnosis not present

## 2021-11-23 DIAGNOSIS — E782 Mixed hyperlipidemia: Secondary | ICD-10-CM | POA: Diagnosis not present

## 2021-11-23 DIAGNOSIS — I10 Essential (primary) hypertension: Secondary | ICD-10-CM | POA: Diagnosis not present

## 2021-11-23 DIAGNOSIS — I509 Heart failure, unspecified: Secondary | ICD-10-CM | POA: Diagnosis not present

## 2021-11-23 DIAGNOSIS — Z8673 Personal history of transient ischemic attack (TIA), and cerebral infarction without residual deficits: Secondary | ICD-10-CM | POA: Diagnosis not present

## 2021-11-23 DIAGNOSIS — B889 Infestation, unspecified: Secondary | ICD-10-CM | POA: Diagnosis not present

## 2021-11-23 DIAGNOSIS — Z79899 Other long term (current) drug therapy: Secondary | ICD-10-CM | POA: Diagnosis not present

## 2021-11-23 DIAGNOSIS — D509 Iron deficiency anemia, unspecified: Secondary | ICD-10-CM | POA: Diagnosis not present

## 2021-11-23 DIAGNOSIS — N390 Urinary tract infection, site not specified: Secondary | ICD-10-CM | POA: Diagnosis not present

## 2021-11-23 DIAGNOSIS — Z20822 Contact with and (suspected) exposure to covid-19: Secondary | ICD-10-CM | POA: Diagnosis not present

## 2021-11-23 DIAGNOSIS — R93421 Abnormal radiologic findings on diagnostic imaging of right kidney: Secondary | ICD-10-CM | POA: Diagnosis not present

## 2021-11-23 DIAGNOSIS — I499 Cardiac arrhythmia, unspecified: Secondary | ICD-10-CM | POA: Diagnosis not present

## 2021-11-23 DIAGNOSIS — R93422 Abnormal radiologic findings on diagnostic imaging of left kidney: Secondary | ICD-10-CM | POA: Diagnosis not present

## 2021-11-23 DIAGNOSIS — N3000 Acute cystitis without hematuria: Secondary | ICD-10-CM | POA: Diagnosis not present

## 2021-11-23 DIAGNOSIS — K922 Gastrointestinal hemorrhage, unspecified: Secondary | ICD-10-CM | POA: Diagnosis not present

## 2021-11-23 DIAGNOSIS — I4891 Unspecified atrial fibrillation: Secondary | ICD-10-CM | POA: Diagnosis not present

## 2021-11-23 DIAGNOSIS — B9689 Other specified bacterial agents as the cause of diseases classified elsewhere: Secondary | ICD-10-CM | POA: Diagnosis not present

## 2021-11-23 DIAGNOSIS — R935 Abnormal findings on diagnostic imaging of other abdominal regions, including retroperitoneum: Secondary | ICD-10-CM | POA: Diagnosis not present

## 2021-11-23 DIAGNOSIS — Z743 Need for continuous supervision: Secondary | ICD-10-CM | POA: Diagnosis not present

## 2021-11-23 DIAGNOSIS — R110 Nausea: Secondary | ICD-10-CM | POA: Diagnosis not present

## 2021-11-23 DIAGNOSIS — N289 Disorder of kidney and ureter, unspecified: Secondary | ICD-10-CM | POA: Diagnosis not present

## 2021-11-23 DIAGNOSIS — Z880 Allergy status to penicillin: Secondary | ICD-10-CM | POA: Diagnosis not present

## 2021-11-23 DIAGNOSIS — Z792 Long term (current) use of antibiotics: Secondary | ICD-10-CM | POA: Diagnosis not present

## 2021-11-23 DIAGNOSIS — F0393 Unspecified dementia, unspecified severity, with mood disturbance: Secondary | ICD-10-CM | POA: Diagnosis not present

## 2021-11-23 DIAGNOSIS — Z66 Do not resuscitate: Secondary | ICD-10-CM | POA: Diagnosis not present

## 2021-11-23 DIAGNOSIS — R0689 Other abnormalities of breathing: Secondary | ICD-10-CM | POA: Diagnosis not present

## 2021-11-23 DIAGNOSIS — K56699 Other intestinal obstruction unspecified as to partial versus complete obstruction: Secondary | ICD-10-CM | POA: Diagnosis not present

## 2021-11-23 DIAGNOSIS — N2889 Other specified disorders of kidney and ureter: Secondary | ICD-10-CM | POA: Diagnosis not present

## 2021-11-23 DIAGNOSIS — F32A Depression, unspecified: Secondary | ICD-10-CM | POA: Diagnosis not present

## 2021-11-23 DIAGNOSIS — K5669 Other partial intestinal obstruction: Secondary | ICD-10-CM | POA: Diagnosis not present

## 2021-11-23 DIAGNOSIS — D500 Iron deficiency anemia secondary to blood loss (chronic): Secondary | ICD-10-CM | POA: Diagnosis not present

## 2021-11-23 DIAGNOSIS — Z634 Disappearance and death of family member: Secondary | ICD-10-CM | POA: Diagnosis not present

## 2021-11-23 DIAGNOSIS — Z8711 Personal history of peptic ulcer disease: Secondary | ICD-10-CM | POA: Diagnosis not present

## 2021-11-23 DIAGNOSIS — K449 Diaphragmatic hernia without obstruction or gangrene: Secondary | ICD-10-CM | POA: Diagnosis not present

## 2021-11-23 DIAGNOSIS — I110 Hypertensive heart disease with heart failure: Secondary | ICD-10-CM | POA: Diagnosis not present

## 2021-11-23 DIAGNOSIS — K56609 Unspecified intestinal obstruction, unspecified as to partial versus complete obstruction: Secondary | ICD-10-CM | POA: Diagnosis not present

## 2021-11-23 DIAGNOSIS — Z86010 Personal history of colonic polyps: Secondary | ICD-10-CM | POA: Diagnosis not present

## 2021-11-23 DIAGNOSIS — D72829 Elevated white blood cell count, unspecified: Secondary | ICD-10-CM | POA: Diagnosis not present

## 2021-11-23 DIAGNOSIS — I3139 Other pericardial effusion (noninflammatory): Secondary | ICD-10-CM | POA: Diagnosis not present

## 2021-11-23 DIAGNOSIS — R0600 Dyspnea, unspecified: Secondary | ICD-10-CM | POA: Diagnosis not present

## 2021-11-25 DIAGNOSIS — K922 Gastrointestinal hemorrhage, unspecified: Secondary | ICD-10-CM | POA: Diagnosis not present

## 2022-01-28 DIAGNOSIS — I361 Nonrheumatic tricuspid (valve) insufficiency: Secondary | ICD-10-CM | POA: Diagnosis not present

## 2022-06-05 DIAGNOSIS — E785 Hyperlipidemia, unspecified: Secondary | ICD-10-CM | POA: Diagnosis not present

## 2022-06-05 DIAGNOSIS — N1831 Chronic kidney disease, stage 3a: Secondary | ICD-10-CM | POA: Diagnosis not present

## 2022-06-05 DIAGNOSIS — D649 Anemia, unspecified: Secondary | ICD-10-CM | POA: Diagnosis not present

## 2022-08-04 DIAGNOSIS — N1831 Chronic kidney disease, stage 3a: Secondary | ICD-10-CM | POA: Diagnosis not present

## 2022-08-04 DIAGNOSIS — D649 Anemia, unspecified: Secondary | ICD-10-CM | POA: Diagnosis not present

## 2022-08-04 DIAGNOSIS — E785 Hyperlipidemia, unspecified: Secondary | ICD-10-CM | POA: Diagnosis not present

## 2022-08-27 DIAGNOSIS — E785 Hyperlipidemia, unspecified: Secondary | ICD-10-CM | POA: Diagnosis not present

## 2022-08-27 DIAGNOSIS — I10 Essential (primary) hypertension: Secondary | ICD-10-CM | POA: Diagnosis not present

## 2022-08-27 DIAGNOSIS — E559 Vitamin D deficiency, unspecified: Secondary | ICD-10-CM | POA: Diagnosis not present

## 2022-08-27 DIAGNOSIS — Z0001 Encounter for general adult medical examination with abnormal findings: Secondary | ICD-10-CM | POA: Diagnosis not present

## 2022-08-27 DIAGNOSIS — R5381 Other malaise: Secondary | ICD-10-CM | POA: Diagnosis not present

## 2022-08-28 NOTE — Patient Outreach (Signed)
Received a Referral from Dr. Abner Greenspan from [facility].  Unfortunately, our Care management team will not be able to assist with this patient at this time. The patient is not attributed to [facility].  Iverson Alamin, Don[place] [facility] Care Management Assistant [facility] Care Management [phone]

## 2022-09-20 ENCOUNTER — Inpatient Hospital Stay (HOSPITAL_COMMUNITY): Payer: 59

## 2022-09-20 ENCOUNTER — Encounter (HOSPITAL_COMMUNITY)
Admission: AD | Disposition: A | Discharge status: Home Health | Payer: Self-pay | Source: Other Acute Inpatient Hospital | Attending: Neurology

## 2022-09-20 ENCOUNTER — Inpatient Hospital Stay (HOSPITAL_COMMUNITY): Payer: 59 | Admitting: Certified Registered Nurse Anesthetist

## 2022-09-20 ENCOUNTER — Observation Stay (HOSPITAL_COMMUNITY)
Admission: AD | Admit: 2022-09-20 | Discharge: 2022-09-23 | DRG: 023 | Disposition: A | Discharge status: Home Health | Payer: 59 | Source: Other Acute Inpatient Hospital | Attending: Neurology | Admitting: Neurology

## 2022-09-20 DIAGNOSIS — G4733 Obstructive sleep apnea (adult) (pediatric): Secondary | ICD-10-CM | POA: Diagnosis present

## 2022-09-20 DIAGNOSIS — Z79899 Other long term (current) drug therapy: Secondary | ICD-10-CM

## 2022-09-20 DIAGNOSIS — I639 Cerebral infarction, unspecified: Secondary | ICD-10-CM | POA: Diagnosis not present

## 2022-09-20 DIAGNOSIS — F0393 Unspecified dementia, unspecified severity, with mood disturbance: Secondary | ICD-10-CM | POA: Diagnosis present

## 2022-09-20 DIAGNOSIS — J9690 Respiratory failure, unspecified, unspecified whether with hypoxia or hypercapnia: Secondary | ICD-10-CM | POA: Diagnosis not present

## 2022-09-20 DIAGNOSIS — Z885 Allergy status to narcotic agent status: Secondary | ICD-10-CM

## 2022-09-20 DIAGNOSIS — I110 Hypertensive heart disease with heart failure: Secondary | ICD-10-CM | POA: Diagnosis present

## 2022-09-20 DIAGNOSIS — D649 Anemia, unspecified: Secondary | ICD-10-CM | POA: Diagnosis present

## 2022-09-20 DIAGNOSIS — J9601 Acute respiratory failure with hypoxia: Secondary | ICD-10-CM | POA: Diagnosis not present

## 2022-09-20 DIAGNOSIS — R4701 Aphasia: Secondary | ICD-10-CM | POA: Diagnosis present

## 2022-09-20 DIAGNOSIS — G8191 Hemiplegia, unspecified affecting right dominant side: Secondary | ICD-10-CM | POA: Diagnosis present

## 2022-09-20 DIAGNOSIS — I63442 Cerebral infarction due to embolism of left cerebellar artery: Secondary | ICD-10-CM | POA: Diagnosis present

## 2022-09-20 DIAGNOSIS — I10 Essential (primary) hypertension: Secondary | ICD-10-CM | POA: Diagnosis not present

## 2022-09-20 DIAGNOSIS — I6389 Other cerebral infarction: Secondary | ICD-10-CM | POA: Diagnosis not present

## 2022-09-20 DIAGNOSIS — I2510 Atherosclerotic heart disease of native coronary artery without angina pectoris: Secondary | ICD-10-CM | POA: Diagnosis present

## 2022-09-20 DIAGNOSIS — Z9049 Acquired absence of other specified parts of digestive tract: Secondary | ICD-10-CM

## 2022-09-20 DIAGNOSIS — K219 Gastro-esophageal reflux disease without esophagitis: Secondary | ICD-10-CM | POA: Diagnosis present

## 2022-09-20 DIAGNOSIS — Z880 Allergy status to penicillin: Secondary | ICD-10-CM | POA: Diagnosis not present

## 2022-09-20 DIAGNOSIS — I482 Chronic atrial fibrillation, unspecified: Secondary | ICD-10-CM | POA: Diagnosis present

## 2022-09-20 DIAGNOSIS — F418 Other specified anxiety disorders: Secondary | ICD-10-CM

## 2022-09-20 DIAGNOSIS — R1310 Dysphagia, unspecified: Secondary | ICD-10-CM | POA: Diagnosis present

## 2022-09-20 DIAGNOSIS — J95821 Acute postprocedural respiratory failure: Secondary | ICD-10-CM | POA: Diagnosis not present

## 2022-09-20 DIAGNOSIS — E1165 Type 2 diabetes mellitus with hyperglycemia: Secondary | ICD-10-CM | POA: Diagnosis present

## 2022-09-20 DIAGNOSIS — I724 Aneurysm of artery of lower extremity: Secondary | ICD-10-CM | POA: Diagnosis not present

## 2022-09-20 DIAGNOSIS — I5032 Chronic diastolic (congestive) heart failure: Secondary | ICD-10-CM | POA: Diagnosis present

## 2022-09-20 DIAGNOSIS — Z7982 Long term (current) use of aspirin: Secondary | ICD-10-CM | POA: Insufficient documentation

## 2022-09-20 DIAGNOSIS — Z8673 Personal history of transient ischemic attack (TIA), and cerebral infarction without residual deficits: Secondary | ICD-10-CM

## 2022-09-20 DIAGNOSIS — J988 Other specified respiratory disorders: Secondary | ICD-10-CM

## 2022-09-20 DIAGNOSIS — R34 Anuria and oliguria: Secondary | ICD-10-CM | POA: Diagnosis not present

## 2022-09-20 DIAGNOSIS — E785 Hyperlipidemia, unspecified: Secondary | ICD-10-CM | POA: Diagnosis not present

## 2022-09-20 DIAGNOSIS — I63412 Cerebral infarction due to embolism of left middle cerebral artery: Secondary | ICD-10-CM | POA: Diagnosis not present

## 2022-09-20 DIAGNOSIS — M797 Fibromyalgia: Secondary | ICD-10-CM | POA: Diagnosis not present

## 2022-09-20 DIAGNOSIS — D509 Iron deficiency anemia, unspecified: Secondary | ICD-10-CM | POA: Diagnosis present

## 2022-09-20 DIAGNOSIS — Z7902 Long term (current) use of antithrombotics/antiplatelets: Secondary | ICD-10-CM | POA: Diagnosis not present

## 2022-09-20 DIAGNOSIS — I63512 Cerebral infarction due to unspecified occlusion or stenosis of left middle cerebral artery: Secondary | ICD-10-CM

## 2022-09-20 DIAGNOSIS — Z7901 Long term (current) use of anticoagulants: Secondary | ICD-10-CM | POA: Diagnosis not present

## 2022-09-20 DIAGNOSIS — R29724 NIHSS score 24: Secondary | ICD-10-CM | POA: Diagnosis present

## 2022-09-20 DIAGNOSIS — E876 Hypokalemia: Secondary | ICD-10-CM | POA: Diagnosis present

## 2022-09-20 DIAGNOSIS — E7800 Pure hypercholesterolemia, unspecified: Secondary | ICD-10-CM | POA: Diagnosis present

## 2022-09-20 DIAGNOSIS — R29810 Facial weakness: Secondary | ICD-10-CM | POA: Diagnosis present

## 2022-09-20 DIAGNOSIS — F0394 Unspecified dementia, unspecified severity, with anxiety: Secondary | ICD-10-CM | POA: Diagnosis not present

## 2022-09-20 DIAGNOSIS — H518 Other specified disorders of binocular movement: Secondary | ICD-10-CM | POA: Diagnosis present

## 2022-09-20 DIAGNOSIS — R2681 Unsteadiness on feet: Secondary | ICD-10-CM | POA: Diagnosis not present

## 2022-09-20 DIAGNOSIS — Z886 Allergy status to analgesic agent status: Secondary | ICD-10-CM

## 2022-09-20 DIAGNOSIS — J4489 Other specified chronic obstructive pulmonary disease: Secondary | ICD-10-CM | POA: Diagnosis present

## 2022-09-20 DIAGNOSIS — R001 Bradycardia, unspecified: Secondary | ICD-10-CM | POA: Diagnosis present

## 2022-09-20 DIAGNOSIS — M6281 Muscle weakness (generalized): Secondary | ICD-10-CM | POA: Diagnosis not present

## 2022-09-20 DIAGNOSIS — F32A Depression, unspecified: Secondary | ICD-10-CM | POA: Diagnosis present

## 2022-09-20 DIAGNOSIS — E119 Type 2 diabetes mellitus without complications: Secondary | ICD-10-CM

## 2022-09-20 DIAGNOSIS — I63312 Cerebral infarction due to thrombosis of left middle cerebral artery: Secondary | ICD-10-CM | POA: Diagnosis not present

## 2022-09-20 DIAGNOSIS — K449 Diaphragmatic hernia without obstruction or gangrene: Secondary | ICD-10-CM | POA: Diagnosis not present

## 2022-09-20 DIAGNOSIS — Z87891 Personal history of nicotine dependence: Secondary | ICD-10-CM

## 2022-09-20 DIAGNOSIS — Z888 Allergy status to other drugs, medicaments and biological substances status: Secondary | ICD-10-CM

## 2022-09-20 DIAGNOSIS — I6602 Occlusion and stenosis of left middle cerebral artery: Secondary | ICD-10-CM | POA: Diagnosis present

## 2022-09-20 DIAGNOSIS — Z8711 Personal history of peptic ulcer disease: Secondary | ICD-10-CM | POA: Diagnosis not present

## 2022-09-20 HISTORY — PX: IR PERCUTANEOUS ART THROMBECTOMY/INFUSION INTRACRANIAL INC DIAG ANGIO: IMG6087

## 2022-09-20 HISTORY — PX: IR CT HEAD LTD: IMG2386

## 2022-09-20 HISTORY — PX: RADIOLOGY WITH ANESTHESIA: SHX6223

## 2022-09-20 LAB — POCT I-STAT 7, (LYTES, BLD GAS, ICA,H+H)
Acid-base deficit: 4 mmol/L — ABNORMAL HIGH (ref 0.0–2.0)
Acid-base deficit: 7 mmol/L — ABNORMAL HIGH (ref 0.0–2.0)
Acid-base deficit: 8 mmol/L — ABNORMAL HIGH (ref 0.0–2.0)
Bicarbonate: 22 mmol/L (ref 20.0–28.0)
Bicarbonate: 19.4 mmol/L — ABNORMAL LOW (ref 20.0–28.0)
Bicarbonate: 16.7 mmol/L — ABNORMAL LOW (ref 20.0–28.0)
Calcium, Ion: 1.27 mmol/L (ref 1.15–1.40)
Calcium, Ion: 1.24 mmol/L (ref 1.15–1.40)
Calcium, Ion: 1.26 mmol/L (ref 1.15–1.40)
HCT: 33 % — ABNORMAL LOW (ref 36.0–46.0)
HCT: 32 % — ABNORMAL LOW (ref 36.0–46.0)
HCT: 30 % — ABNORMAL LOW (ref 36.0–46.0)
Hemoglobin: 11.2 g/dL — ABNORMAL LOW (ref 12.0–15.0)
Hemoglobin: 10.9 g/dL — ABNORMAL LOW (ref 12.0–15.0)
Hemoglobin: 10.2 g/dL — ABNORMAL LOW (ref 12.0–15.0)
O2 Saturation: 93 %
O2 Saturation: 93 %
O2 Saturation: 97 %
Patient temperature: 98.6
Potassium: 3.6 mmol/L (ref 3.5–5.1)
Potassium: 3.1 mmol/L — ABNORMAL LOW (ref 3.5–5.1)
Potassium: 3.9 mmol/L (ref 3.5–5.1)
Sodium: 138 mmol/L (ref 135–145)
Sodium: 140 mmol/L (ref 135–145)
Sodium: 139 mmol/L (ref 135–145)
TCO2: 23 mmol/L (ref 22–32)
TCO2: 21 mmol/L — ABNORMAL LOW (ref 22–32)
TCO2: 18 mmol/L — ABNORMAL LOW (ref 22–32)
pCO2 arterial: 44.7 mmHg (ref 32–48)
pCO2 arterial: 43.2 mmHg (ref 32–48)
pCO2 arterial: 30.7 mmHg — ABNORMAL LOW (ref 32–48)
pH, Arterial: 7.299 — ABNORMAL LOW (ref 7.35–7.45)
pH, Arterial: 7.261 — ABNORMAL LOW (ref 7.35–7.45)
pH, Arterial: 7.344 — ABNORMAL LOW (ref 7.35–7.45)
pO2, Arterial: 75 mmHg — ABNORMAL LOW (ref 83–108)
pO2, Arterial: 76 mmHg — ABNORMAL LOW (ref 83–108)
pO2, Arterial: 100 mmHg (ref 83–108)

## 2022-09-20 LAB — CBC WITH DIFFERENTIAL/PLATELET
Abs Immature Granulocytes: 0.04 10*3/uL (ref 0.00–0.07)
Basophils Absolute: 0 10*3/uL (ref 0.0–0.1)
Basophils Relative: 0 %
Eosinophils Absolute: 0 10*3/uL (ref 0.0–0.5)
Eosinophils Relative: 0 %
HCT: 32.6 % — ABNORMAL LOW (ref 36.0–46.0)
Hemoglobin: 10 g/dL — ABNORMAL LOW (ref 12.0–15.0)
Immature Granulocytes: 0 %
Lymphocytes Relative: 6 %
Lymphs Abs: 0.6 10*3/uL — ABNORMAL LOW (ref 0.7–4.0)
MCH: 23.6 pg — ABNORMAL LOW (ref 26.0–34.0)
MCHC: 30.7 g/dL (ref 30.0–36.0)
MCV: 77.1 fL — ABNORMAL LOW (ref 80.0–100.0)
Monocytes Absolute: 0.2 10*3/uL (ref 0.1–1.0)
Monocytes Relative: 2 %
Neutro Abs: 8.8 10*3/uL — ABNORMAL HIGH (ref 1.7–7.7)
Neutrophils Relative %: 92 %
Platelets: 330 10*3/uL (ref 150–400)
RBC: 4.23 MIL/uL (ref 3.87–5.11)
RDW: 25.2 % — ABNORMAL HIGH (ref 11.5–15.5)
WBC: 9.7 10*3/uL (ref 4.0–10.5)
nRBC: 0 % (ref 0.0–0.2)

## 2022-09-20 LAB — COMPREHENSIVE METABOLIC PANEL
ALT: 15 U/L (ref 0–44)
AST: 18 U/L (ref 15–41)
Albumin: 3.5 g/dL (ref 3.5–5.0)
Alkaline Phosphatase: 97 U/L (ref 38–126)
Anion gap: 11 (ref 5–15)
BUN: 5 mg/dL — ABNORMAL LOW (ref 8–23)
CO2: 21 mmol/L — ABNORMAL LOW (ref 22–32)
Calcium: 8.8 mg/dL — ABNORMAL LOW (ref 8.9–10.3)
Chloride: 103 mmol/L (ref 98–111)
Creatinine, Ser: 0.95 mg/dL (ref 0.44–1.00)
GFR, Estimated: >60 mL/min (ref >60)
Glucose, Bld: 170 mg/dL — ABNORMAL HIGH (ref 70–99)
Potassium: 3.4 mmol/L — ABNORMAL LOW (ref 3.5–5.1)
Sodium: 135 mmol/L (ref 135–145)
Total Bilirubin: 0.8 mg/dL (ref 0.3–1.2)
Total Protein: 6.5 g/dL (ref 6.5–8.1)

## 2022-09-20 LAB — POCT I-STAT, CHEM 8
BUN: 9 mg/dL (ref 8–23)
Calcium, Ion: 1.23 mmol/L (ref 1.15–1.40)
Chloride: 104 mmol/L (ref 98–111)
Creatinine, Ser: 0.9 mg/dL (ref 0.44–1.00)
Glucose, Bld: 134 mg/dL — ABNORMAL HIGH (ref 70–99)
HCT: 32 % — ABNORMAL LOW (ref 36.0–46.0)
Hemoglobin: 10.9 g/dL — ABNORMAL LOW (ref 12.0–15.0)
Potassium: 4.1 mmol/L (ref 3.5–5.1)
Sodium: 137 mmol/L (ref 135–145)
TCO2: 27 mmol/L (ref 22–32)

## 2022-09-20 LAB — LIPID PANEL
Cholesterol: 192 mg/dL (ref 0–200)
HDL: 54 mg/dL (ref >40)
LDL Cholesterol: 120 mg/dL — ABNORMAL HIGH (ref 0–99)
Total CHOL/HDL Ratio: 3.6 RATIO
Triglycerides: 92 mg/dL (ref <150)
VLDL: 18 mg/dL (ref 0–40)

## 2022-09-20 LAB — ECHOCARDIOGRAM COMPLETE
Area-P 1/2: 2.26 cm2
Height: 62 in
S' Lateral: 2.7 cm
Weight: 2606.72 oz

## 2022-09-20 LAB — URINALYSIS, COMPLETE (UACMP) WITH MICROSCOPIC
Bilirubin Urine: NEGATIVE
Glucose, UA: NEGATIVE mg/dL
Hgb urine dipstick: NEGATIVE
Ketones, ur: NEGATIVE mg/dL
Leukocytes,Ua: NEGATIVE
Nitrite: NEGATIVE
Protein, ur: 100 mg/dL — AB
Specific Gravity, Urine: >1.046 — ABNORMAL HIGH (ref 1.005–1.030)
pH: 6 (ref 5.0–8.0)

## 2022-09-20 LAB — PROTIME-INR
INR: 1.1 (ref 0.8–1.2)
Prothrombin Time: 14.8 seconds (ref 11.4–15.2)

## 2022-09-20 LAB — MAGNESIUM: Magnesium: 1.8 mg/dL (ref 1.7–2.4)

## 2022-09-20 LAB — APTT: aPTT: 78 seconds — ABNORMAL HIGH (ref 24–36)

## 2022-09-20 LAB — GLUCOSE, CAPILLARY
Glucose-Capillary: 112 mg/dL — ABNORMAL HIGH (ref 70–99)
Glucose-Capillary: 122 mg/dL — ABNORMAL HIGH (ref 70–99)
Glucose-Capillary: 125 mg/dL — ABNORMAL HIGH (ref 70–99)
Glucose-Capillary: 146 mg/dL — ABNORMAL HIGH (ref 70–99)
Glucose-Capillary: 191 mg/dL — ABNORMAL HIGH (ref 70–99)

## 2022-09-20 LAB — PHOSPHORUS: Phosphorus: 3.6 mg/dL (ref 2.5–4.6)

## 2022-09-20 LAB — MRSA NEXT GEN BY PCR, NASAL: MRSA by PCR Next Gen: NOT DETECTED

## 2022-09-20 SURGERY — IR WITH ANESTHESIA
Anesthesia: General

## 2022-09-20 MED ORDER — ROSUVASTATIN CALCIUM 20 MG PO TABS
40.0000 mg | ORAL_TABLET | Freq: Every day | ORAL | Status: DC
  Administered 2022-09-20 – 2022-09-22 (×3): 40 mg
  Filled 2022-09-20 (×3): qty 2

## 2022-09-20 MED ORDER — LACTATED RINGERS IV SOLN: INTRAVENOUS | Status: DC | PRN

## 2022-09-20 MED ORDER — ONDANSETRON HCL 4 MG/2ML IJ SOLN
INTRAMUSCULAR | Status: DC | PRN
  Administered 2022-09-20: 4 mg via INTRAVENOUS

## 2022-09-20 MED ORDER — TICAGRELOR 90 MG PO TABS
90.0000 mg | ORAL_TABLET | Freq: Two times a day (BID) | ORAL | Status: DC
  Administered 2022-09-20 (×2): 90 mg
  Filled 2022-09-20 (×2): qty 1

## 2022-09-20 MED ORDER — DEXAMETHASONE SODIUM PHOSPHATE 10 MG/ML IJ SOLN
INTRAMUSCULAR | Status: DC | PRN
  Administered 2022-09-20: 10 mg via INTRAVENOUS

## 2022-09-20 MED ORDER — POLYETHYLENE GLYCOL 3350 17 G PO PACK
17.0000 g | PACK | Freq: Every day | ORAL | Status: DC
  Administered 2022-09-20 – 2022-09-22 (×2): 17 g
  Filled 2022-09-20 (×2): qty 1

## 2022-09-20 MED ORDER — SODIUM CHLORIDE 0.9 % IV SOLN: INTRAVENOUS | Status: DC

## 2022-09-20 MED ORDER — NITROGLYCERIN 1 MG/10 ML FOR IR/CATH LAB
INTRA_ARTERIAL | Status: AC
  Filled 2022-09-20: qty 10

## 2022-09-20 MED ORDER — PROPOFOL 500 MG/50ML IV EMUL
INTRAVENOUS | Status: DC | PRN
  Administered 2022-09-20: 50 ug/kg/min via INTRAVENOUS

## 2022-09-20 MED ORDER — NITROGLYCERIN 1 MG/10 ML FOR IR/CATH LAB
INTRA_ARTERIAL | Status: DC | PRN
  Administered 2022-09-20: 25 ug via INTRA_ARTERIAL
  Administered 2022-09-20: 50 ug via INTRA_ARTERIAL
  Administered 2022-09-20: 25 ug via INTRA_ARTERIAL

## 2022-09-20 MED ORDER — ASPIRIN 81 MG PO CHEW
CHEWABLE_TABLET | ORAL | Status: AC
  Filled 2022-09-20: qty 1

## 2022-09-20 MED ORDER — SODIUM CHLORIDE 0.9 % IV SOLN: INTRAVENOUS | Status: AC

## 2022-09-20 MED ORDER — SUCCINYLCHOLINE CHLORIDE 200 MG/10ML IV SOSY
PREFILLED_SYRINGE | INTRAVENOUS | Status: DC | PRN
  Administered 2022-09-20: 90 mg via INTRAVENOUS

## 2022-09-20 MED ORDER — PHENYLEPHRINE 80 MCG/ML (10ML) SYRINGE FOR IV PUSH (FOR BLOOD PRESSURE SUPPORT)
PREFILLED_SYRINGE | INTRAVENOUS | Status: DC | PRN
  Administered 2022-09-20: 80 ug via INTRAVENOUS

## 2022-09-20 MED ORDER — CANGRELOR TETRASODIUM 50 MG IV SOLR
INTRAVENOUS | Status: AC
  Filled 2022-09-20: qty 50

## 2022-09-20 MED ORDER — ALBUTEROL SULFATE (2.5 MG/3ML) 0.083% IN NEBU: 2.5000 mg | INHALATION_SOLUTION | RESPIRATORY_TRACT | Status: DC | PRN

## 2022-09-20 MED ORDER — CLEVIDIPINE BUTYRATE 0.5 MG/ML IV EMUL
0.0000 mg/h | INTRAVENOUS | Status: AC
  Administered 2022-09-20: 9 mg/h via INTRAVENOUS
  Filled 2022-09-20 (×2): qty 50

## 2022-09-20 MED ORDER — TICAGRELOR 90 MG PO TABS
90.0000 mg | ORAL_TABLET | Freq: Two times a day (BID) | ORAL | Status: DC
  Administered 2022-09-21 (×2): 90 mg via ORAL
  Filled 2022-09-20 (×2): qty 1

## 2022-09-20 MED ORDER — ORAL CARE MOUTH RINSE: 15.0000 mL | OROMUCOSAL | Status: DC | PRN

## 2022-09-20 MED ORDER — ASPIRIN 300 MG RE SUPP
300.0000 mg | Freq: Once | RECTAL | Status: AC
  Administered 2022-09-20: 300 mg via RECTAL
  Filled 2022-09-20: qty 1

## 2022-09-20 MED ORDER — TICAGRELOR 90 MG PO TABS
ORAL_TABLET | ORAL | Status: AC
  Filled 2022-09-20: qty 1

## 2022-09-20 MED ORDER — ROCURONIUM BROMIDE 10 MG/ML (PF) SYRINGE
PREFILLED_SYRINGE | INTRAVENOUS | Status: DC | PRN
  Administered 2022-09-20: 50 mg via INTRAVENOUS
  Administered 2022-09-20: 20 mg via INTRAVENOUS

## 2022-09-20 MED ORDER — FENTANYL CITRATE (PF) 100 MCG/2ML IJ SOLN
INTRAMUSCULAR | Status: DC | PRN
  Administered 2022-09-20 (×2): 50 ug via INTRAVENOUS

## 2022-09-20 MED ORDER — IOHEXOL 300 MG/ML  SOLN
150.0000 mL | Freq: Once | INTRAMUSCULAR | Status: AC | PRN
  Administered 2022-09-20: 70 mL via INTRA_ARTERIAL

## 2022-09-20 MED ORDER — ASPIRIN 81 MG PO CHEW
81.0000 mg | CHEWABLE_TABLET | Freq: Every day | ORAL | Status: DC
  Administered 2022-09-21 – 2022-09-23 (×3): 81 mg via ORAL
  Filled 2022-09-20 (×3): qty 1

## 2022-09-20 MED ORDER — MIDAZOLAM HCL 2 MG/2ML IJ SOLN: 1.0000 mg | INTRAMUSCULAR | Status: DC | PRN

## 2022-09-20 MED ORDER — POLYETHYLENE GLYCOL 3350 17 G PO PACK: 17.0000 g | PACK | Freq: Every day | ORAL | Status: DC

## 2022-09-20 MED ORDER — SENNOSIDES-DOCUSATE SODIUM 8.6-50 MG PO TABS: 1.0000 | ORAL_TABLET | Freq: Every evening | ORAL | Status: DC | PRN

## 2022-09-20 MED ORDER — PROPOFOL 1000 MG/100ML IV EMUL
0.0000 ug/kg/min | INTRAVENOUS | Status: DC
  Administered 2022-09-20: 50 ug/kg/min via INTRAVENOUS

## 2022-09-20 MED ORDER — PROPOFOL 10 MG/ML IV BOLUS
INTRAVENOUS | Status: DC | PRN
  Administered 2022-09-20: 50 mg via INTRAVENOUS
  Administered 2022-09-20: 150 mg via INTRAVENOUS

## 2022-09-20 MED ORDER — INSULIN ASPART 100 UNIT/ML IJ SOLN
0.0000 [IU] | INTRAMUSCULAR | Status: DC
  Administered 2022-09-20: 2 [IU] via SUBCUTANEOUS
  Administered 2022-09-20: 3 [IU] via SUBCUTANEOUS
  Administered 2022-09-20 – 2022-09-21 (×3): 2 [IU] via SUBCUTANEOUS

## 2022-09-20 MED ORDER — CHLORHEXIDINE GLUCONATE CLOTH 2 % EX PADS
6.0000 | MEDICATED_PAD | Freq: Every day | CUTANEOUS | Status: DC
  Administered 2022-09-20 – 2022-09-23 (×4): 6 via TOPICAL

## 2022-09-20 MED ORDER — DOCUSATE SODIUM 50 MG/5ML PO LIQD
100.0000 mg | Freq: Two times a day (BID) | ORAL | Status: DC
  Administered 2022-09-20 – 2022-09-22 (×4): 100 mg
  Filled 2022-09-20 (×5): qty 10

## 2022-09-20 MED ORDER — ORAL CARE MOUTH RINSE
15.0000 mL | OROMUCOSAL | Status: DC
  Administered 2022-09-20 – 2022-09-22 (×26): 15 mL via OROMUCOSAL

## 2022-09-20 MED ORDER — SODIUM CHLORIDE 0.9 % IV SOLN
2.0000 ug/kg/min | INTRAVENOUS | Status: AC
  Filled 2022-09-20: qty 50

## 2022-09-20 MED ORDER — CANGRELOR BOLUS VIA INFUSION
15.0000 ug/kg | Freq: Once | INTRAVENOUS | Status: AC
  Administered 2022-09-20: 15 ug via INTRAVENOUS

## 2022-09-20 MED ORDER — FENTANYL CITRATE PF 50 MCG/ML IJ SOSY
25.0000 ug | PREFILLED_SYRINGE | INTRAMUSCULAR | Status: DC | PRN
  Administered 2022-09-20: 25 ug via INTRAVENOUS
  Administered 2022-09-20: 50 ug via INTRAVENOUS
  Administered 2022-09-21 (×2): 25 ug via INTRAVENOUS
  Filled 2022-09-20 (×5): qty 1

## 2022-09-20 MED ORDER — LIDOCAINE 2% (20 MG/ML) 5 ML SYRINGE
INTRAMUSCULAR | Status: DC | PRN
  Administered 2022-09-20: 60 mg via INTRAVENOUS

## 2022-09-20 MED ORDER — FENTANYL CITRATE (PF) 100 MCG/2ML IJ SOLN
INTRAMUSCULAR | Status: AC
  Filled 2022-09-20: qty 2

## 2022-09-20 MED ORDER — EPHEDRINE SULFATE-NACL 50-0.9 MG/10ML-% IV SOSY
PREFILLED_SYRINGE | INTRAVENOUS | Status: DC | PRN
  Administered 2022-09-20 (×3): 5 mg via INTRAVENOUS

## 2022-09-20 MED ORDER — ACETAMINOPHEN 160 MG/5ML PO SOLN: 650.0000 mg | ORAL | Status: DC | PRN

## 2022-09-20 MED ORDER — CLEVIDIPINE BUTYRATE 0.5 MG/ML IV EMUL
INTRAVENOUS | Status: DC | PRN
  Administered 2022-09-20: 2 mg/h via INTRAVENOUS

## 2022-09-20 MED ORDER — CEFAZOLIN SODIUM-DEXTROSE 2-3 GM-%(50ML) IV SOLR
INTRAVENOUS | Status: DC | PRN
  Administered 2022-09-20: 2 g via INTRAVENOUS

## 2022-09-20 MED ORDER — PANTOPRAZOLE SODIUM 40 MG IV SOLR
40.0000 mg | Freq: Every day | INTRAVENOUS | Status: DC
  Administered 2022-09-20 – 2022-09-22 (×3): 40 mg via INTRAVENOUS
  Filled 2022-09-20 (×3): qty 10

## 2022-09-20 MED ORDER — VERAPAMIL HCL 2.5 MG/ML IV SOLN
INTRAVENOUS | Status: AC
  Filled 2022-09-20: qty 2

## 2022-09-20 MED ORDER — ACETAMINOPHEN 325 MG PO TABS
650.0000 mg | ORAL_TABLET | ORAL | Status: DC | PRN
Start: 2022-09-20 — End: 2022-09-20

## 2022-09-20 MED ORDER — FENTANYL CITRATE PF 50 MCG/ML IJ SOSY: 25.0000 ug | PREFILLED_SYRINGE | INTRAMUSCULAR | Status: DC | PRN

## 2022-09-20 MED ORDER — DOCUSATE SODIUM 50 MG/5ML PO LIQD: 100.0000 mg | Freq: Two times a day (BID) | ORAL | Status: DC

## 2022-09-20 MED ORDER — ACETAMINOPHEN 650 MG RE SUPP: 650.0000 mg | RECTAL | Status: DC | PRN

## 2022-09-20 MED ORDER — SODIUM CHLORIDE 0.9 % IV SOLN
2.0000 ug/kg/min | INTRAVENOUS | Status: DC
  Administered 2022-09-20: 2 ug/kg/min via INTRAVENOUS

## 2022-09-20 MED ORDER — ACETAMINOPHEN 325 MG PO TABS
650.0000 mg | ORAL_TABLET | ORAL | Status: DC | PRN
  Administered 2022-09-23: 650 mg via ORAL
  Filled 2022-09-20: qty 2

## 2022-09-20 MED ORDER — GLYCOPYRROLATE PF 0.2 MG/ML IJ SOSY
PREFILLED_SYRINGE | INTRAMUSCULAR | Status: DC | PRN
  Administered 2022-09-20: .2 mg via INTRAVENOUS

## 2022-09-20 MED ORDER — HYDRALAZINE HCL 20 MG/ML IJ SOLN
10.0000 mg | Freq: Four times a day (QID) | INTRAMUSCULAR | Status: DC | PRN
  Administered 2022-09-20 – 2022-09-22 (×2): 20 mg via INTRAVENOUS
  Filled 2022-09-20 (×2): qty 1

## 2022-09-20 MED ORDER — STROKE: EARLY STAGES OF RECOVERY BOOK
Freq: Once | Status: AC
  Filled 2022-09-20 (×2): qty 1

## 2022-09-20 MED ORDER — IOHEXOL 300 MG/ML  SOLN
50.0000 mL | Freq: Once | INTRAMUSCULAR | Status: AC | PRN
  Administered 2022-09-20: 40 mL via INTRA_ARTERIAL

## 2022-09-20 MED ORDER — VERAPAMIL HCL 2.5 MG/ML IV SOLN
INTRAVENOUS | Status: DC | PRN
  Administered 2022-09-20: 2.5 mg via INTRAVENOUS

## 2022-09-20 MED ORDER — PROPOFOL 1000 MG/100ML IV EMUL
0.0000 ug/kg/min | INTRAVENOUS | Status: DC
  Administered 2022-09-20: 50 ug/kg/min via INTRAVENOUS
  Administered 2022-09-20: 25 ug/kg/min via INTRAVENOUS
  Administered 2022-09-20: 45 ug/kg/min via INTRAVENOUS
  Administered 2022-09-21: 20 ug/kg/min via INTRAVENOUS
  Filled 2022-09-20 (×3): qty 100

## 2022-09-20 MED ORDER — CEFAZOLIN SODIUM-DEXTROSE 2-4 GM/100ML-% IV SOLN
INTRAVENOUS | Status: AC
  Filled 2022-09-20: qty 100

## 2022-09-20 MED ORDER — ACETAMINOPHEN 160 MG/5ML PO SOLN
650.0000 mg | ORAL | Status: DC | PRN
Start: 2022-09-20 — End: 2022-09-20

## 2022-09-20 MED ORDER — ASPIRIN 81 MG PO CHEW
81.0000 mg | CHEWABLE_TABLET | Freq: Every day | ORAL | Status: DC
  Filled 2022-09-20: qty 1

## 2022-09-20 MED ORDER — POTASSIUM CHLORIDE 10 MEQ/100ML IV SOLN
10.0000 meq | INTRAVENOUS | Status: AC
  Administered 2022-09-20 (×3): 10 meq via INTRAVENOUS
  Filled 2022-09-20 (×3): qty 100

## 2022-09-20 NOTE — Consult Note (Signed)
Cardiology Consultation   Patient ID: Melissa [name] MRN: [id number]; DOB: [birth year]  Admit date: 09/20/2022 Date of Consult: 09/20/2022  PCP:  Abner Greenspan, MD   [facility] Providers Cardiologist:  None        Patient Profile:   Melissa [name] is a 77 y.o. female with a hx of CHF, CVA, atrial fibrillation, dementia, hypertension who is being seen 09/20/2022 for the evaluation of atrial fibrillation at the request of Dr. De Hollingshead.  History of Present Illness:   Melissa [name] is a 77 year old female with the above medical history who was admitted with acute CVA.  She presented to [facility] on 5/18 with acute onset of aphasia and right hemiplegia.  Family had heard her fall around 46 PM and went to her room, noted neurologic deficits and EMS was called.  Head CTA showed left M1 occlusion.  She was transferred to [facility] for thrombectomy.  Underwent thrombectomy, stent placement was required due to calcified stenosis.  She was started on cangrelor after procedure with plan to follow-up head CT this morning and if CT negative for hemorrhagic conversion then start Brilinta.  Head CT shows no acute intracranial hemorrhage.  Most recent echo 01/2022 showed EF 60 to 65%, severe left atrial enlargement, moderately elevated pulmonary arterial pressures.  Past Medical History:  Diagnosis Date   Anemia    Anxiety    Arthritis    "all over" (12/08/2017)   Asthma    CHF (congestive heart failure) (HCC)    Chronic lower back pain    COPD (chronic obstructive pulmonary disease) (HCC)    Dementia (HCC)    Depression    Diabetes mellitus without complication (HCC)    Fibromyalgia    Gastric ulcer    GERD (gastroesophageal reflux disease)    Guillain Barr syndrome (HCC)    Hematemesis 12/07/2017   History of blood transfusion    "low blood" (12/08/2017)   History of stomach ulcers    Hypercholesterolemia    Hypertension    Migraine    "used to have them; don't have them anymore"  (12/08/2017)   Pneumonia    "probably twice" (12/08/2017)   Sleep apnea    "couldn't take the mask" (12/08/2017)   Stroke (HCC)    "I don't remember if I have had one or not; I believe I have" (12/08/2017)    Past Surgical History:  Procedure Laterality Date   ABDOMINAL HERNIA REPAIR     ABDOMINAL HYSTERECTOMY     APPENDECTOMY     BIOPSY  12/08/2017   Procedure: BIOPSY;  Surgeon: Lynann Bologna, MD;  Location: [facility] ENDOSCOPY;  Service: Endoscopy;;   BREAST SURGERY Left    "tumors removed; not cancer"   BUNIONECTOMY Bilateral    CARPAL TUNNEL RELEASE Bilateral    CATARACT EXTRACTION, BILATERAL Bilateral    CHOLECYSTECTOMY     COLONOSCOPY  03/03/2011   Mild sigmoid diverticulosis. Internal hemorrhoids.    DILATION AND CURETTAGE OF UTERUS     ELBOW FRACTURE SURGERY Left    ESOPHAGOGASTRODUODENOSCOPY N/A 12/08/2017   Procedure: ESOPHAGOGASTRODUODENOSCOPY (EGD);  Surgeon: Lynann Bologna, MD;  Location: [facility] ENDOSCOPY;  Service: Endoscopy;  Laterality: N/A;   ESOPHAGOGASTRODUODENOSCOPY ENDOSCOPY  12/08/2017   w/bx   FRACTURE SURGERY     HERNIA REPAIR     HIP FRACTURE SURGERY Left    KNEE ARTHROSCOPY Right    SHOULDER OPEN ROTATOR CUFF REPAIR Bilateral    TONSILLECTOMY        Inpatient Medications: Scheduled Meds:  [  START ON 09/21/2022]  stroke: early stages of recovery book   Does not apply Once   [START ON 09/21/2022] aspirin  81 mg Oral Daily   Or   [START ON 09/21/2022] aspirin  81 mg Per Tube Daily   docusate  100 mg Per Tube BID   insulin aspart  0-15 Units Subcutaneous Q4H   mouth rinse  15 mL Mouth Rinse Q2H   pantoprazole (PROTONIX) IV  40 mg Intravenous Daily   polyethylene glycol  17 g Per Tube Daily   ticagrelor  90 mg Oral BID   Or   ticagrelor  90 mg Per Tube BID   Continuous Infusions:  sodium chloride     sodium chloride 75 mL/hr at 09/20/22 0700   clevidipine 9 mg/hr (09/20/22 0700)   potassium chloride 10 mEq (09/20/22 1048)   propofol (DIPRIVAN) infusion 45  mcg/kg/min (09/20/22 0754)   PRN Meds: acetaminophen **OR** acetaminophen (TYLENOL) oral liquid 160 mg/5 mL **OR** acetaminophen, albuterol, fentaNYL (SUBLIMAZE) injection, hydrALAZINE, midazolam, mouth rinse, senna-docusate  Allergies:    Allergies  Allergen Reactions   Codeine Nausea And Vomiting   Lyrica [Pregabalin] Other (See Comments)    unknown   Penicillins Other (See Comments)    Childhood allergy     Social History:   Social History   Socioeconomic History   Marital status: Divorced    Spouse name: Not on file   Number of children: Not on file   Years of education: Not on file   Highest education level: Not on file  Occupational History   Not on file  Tobacco Use   Smoking status: Never   Smokeless tobacco: Former    Types: Chew   Tobacco comments:    12/08/2017 "stopped chewing a few years back"  Vaping Use   Vaping Use: Never used  Substance and Sexual Activity   Alcohol use: Never   Drug use: Never   Sexual activity: Not Currently  Other Topics Concern   Not on file  Social History Narrative   Not on file   Social Determinants of Health   Financial Resource Strain: Not on file  Food Insecurity: Not on file  Transportation Needs: Not on file  Physical Activity: Not on file  Stress: Not on file  Social Connections: Not on file  Intimate Partner Violence: Not on file    Family History:   No family history on file.   ROS:  Please see the history of present illness.   All other ROS reviewed and negative.     Physical Exam/Data:   Vitals:   09/20/22 0700 09/20/22 0752 09/20/22 0800 09/20/22 1104  BP: (!) 141/73   (!) 178/74  Pulse: 78     Resp: 18     Temp:   (!) 97.5 F (36.4 C)   TempSrc:   Axillary   SpO2: 94% 96%    Weight:      Height:  5\' 2"  (1.575 m)      Intake/Output Summary (Last 24 hours) at 09/20/2022 1124 Last data filed at 09/20/2022 0700 Gross per 24 hour  Intake 1895.91 ml  Output 610 ml  Net 1285.91 ml       09/20/2022    4:52 AM 12/09/2017    6:52 AM 12/07/2017    3:58 AM  Last 3 Weights  Weight (lbs) 162 lb 14.7 oz 138 lb 10.7 oz 140 lb  Weight (kg) 73.9 kg 62.9 kg 63.504 kg     Body mass index  is 29.8 kg/m.  General:  Intubated, sedated HEENT: normal Cardiac:  irregular, normal rate, no murmur  Lungs:  clear to auscultation bilaterally in anterior fields Abd: soft, nontender Ext: no edema Musculoskeletal:  No deformities Skin: warm and dry  Neuro:  Sedated, not following commands or opening eyes Psych:  Unable to assess  EKG:  The EKG was personally reviewed and demonstrates:  Afib, rate 66, Qtc 507 Telemetry:  Telemetry was personally reviewed and demonstrates:  Afib with rate 50s-70s  Relevant CV Studies:   Laboratory Data:  High Sensitivity Troponin:  No results for input(s): "TROPONINIHS" in the last 720 hours.   Chemistry Recent Labs  Lab 09/20/22 0218 09/20/22 0504 09/20/22 0514 09/20/22 0803  NA 137 135 138 140  K 4.1 3.4* 3.6 3.1*  CL 104 103  --   --   CO2  --  21*  --   --   GLUCOSE 134* 170*  --   --   BUN 9 5*  --   --   CREATININE 0.90 0.95  --   --   CALCIUM  --  8.8*  --   --   GFRNONAA  --  >60  --   --   ANIONGAP  --  11  --   --     Recent Labs  Lab 09/20/22 0504  PROT 6.5  ALBUMIN 3.5  AST 18  ALT 15  ALKPHOS 97  BILITOT 0.8   Lipids  Recent Labs  Lab 09/20/22 0504  CHOL 192  TRIG 92  HDL 54  LDLCALC 120*  CHOLHDL 3.6    Hematology Recent Labs  Lab 09/20/22 0504 09/20/22 0514 09/20/22 0803  WBC 9.7  --   --   RBC 4.23  --   --   HGB 10.0* 11.2* 10.9*  HCT 32.6* 33.0* 32.0*  MCV 77.1*  --   --   MCH 23.6*  --   --   MCHC 30.7  --   --   RDW 25.2*  --   --   PLT 330  --   --    Thyroid No results for input(s): "TSH", "FREET4" in the last 168 hours.  BNPNo results for input(s): "BNP", "PROBNP" in the last 168 hours.  DDimer No results for input(s): "DDIMER" in the last 168 hours.   Radiology/Studies:  DG Abd 1  View  Result Date: 09/20/2022 CLINICAL DATA:  Confirm orogastric tube placement. EXAM: ABDOMEN - 1 VIEW COMPARISON:  12/17/2017 and CT of the abdomen and pelvis on 12/18/2017 FINDINGS: Enteric tube has been placed, tip overlying the level of the proximal stomach. Side port is in the region of the gastroesophageal junction. Course of the tube in the thorax is consistent with hiatal hernia. Visualized bowel gas pattern is nonobstructive. IMPRESSION: Enteric tube tip overlies the level of the proximal stomach. Electronically Signed   By: Norva Pavlov M.D.   On: 09/20/2022 09:37   CT HEAD WO CONTRAST ( )  Result Date: 09/20/2022 CLINICAL DATA:  77 year old female code stroke presentation yesterday at [facility] left MCA M1 ELVO. Status post endovascular revascularization. EXAM: CT HEAD WITHOUT CONTRAST TECHNIQUE: Contiguous axial images were obtained from the base of the skull through the vertex without intravenous contrast. RADIATION DOSE REDUCTION: This exam was performed according to the departmental dose-optimization program which includes automated exposure control, adjustment of the mA and/or kV according to patient size and/or use of iterative reconstruction technique. COMPARISON:  Code stroke presentation head CT yesterday.  FINDINGS: Brain: Residual intravascular contrast. Chronic left basal ganglia lacunar infarct. Gray-white differentiation in both hemispheres appears stable since yesterday. No definite acute or evolving left MCA infarct by CT. No acute intracranial hemorrhage identified. No midline shift, mass effect, or evidence of intracranial mass lesion. No ventriculomegaly. Vascular: New left MCA M1 segment vascular stent. Calcified atherosclerosis at the skull base. Skull: No acute osseous abnormality identified. Sinuses/Orbits: Intubated now. Visualized paranasal sinuses and mastoids are stable and well aerated. Other: No acute orbit or scalp soft tissue finding. IMPRESSION: 1. New  Left MCA vascular stent and some residual intravascular contrast. 2. No definite acute or evolving infarct by CT. No acute intracranial hemorrhage or mass effect. 3. Chronic left basal ganglia lacunar infarct. Electronically Signed   By: Odessa Fleming M.D.   On: 09/20/2022 08:31   DG Chest Port 1 View  Result Date: 09/20/2022 CLINICAL DATA:  [id number], [id number].  Check NGT, ETT. EXAM: PORTABLE CHEST 1 VIEW COMPARISON:  CTA chest, abdomen and pelvis 01/27/2022, chest earlier today at 12:33 a.m. FINDINGS: 6:47 a.m. ETT interval insertion with the tip 3 cm from the carina. NGT is within a sizable hiatal hernia. Based on the prior CTA chest the sidehole is in the distal thoracic esophagus and should be advanced further in about 10 cm. On that study, just over 1/2 of the stomach was intrathoracic and inverted. The lungs are clear of infiltrates.  No pleural effusion is seen. There is stable moderate to severe cardiomegaly and central vascular fullness without evidence of acute CHF. The aortic arch is heavily calcified.  The mediastinum is stable. Osteopenia and degenerative change thoracic spine. IMPRESSION: 1. ETT interval insertion with the tip 3 cm from the carina. 2. NGT is within a sizable hiatal hernia. Based on the prior CTA chest the sidehole is in the distal thoracic esophagus and should be advanced further in about 10 cm. 3. No acute chest findings.  Stable cardiomegaly. Electronically Signed   By: Almira Bar M.D.   On: 09/20/2022 07:35     Assessment and Plan:   Chronic atrial fibrillation: Has not been on anticoagulation as outpatient due to recurrent GI bleeding.   -Anticoagulation currently on hold in setting of acute CVA, defer to neurology on timing for starting anticoagulation in setting of acute CVA.  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  S/p M1 thrombectomy.  Stent was placed, started on Brilinta.   For questions or updates, please contact [facility] Please consult [email or web address] for contact info under    Signed, Little Ishikawa, MD  09/20/2022 11:24 AM

## 2022-09-20 NOTE — H&P (Addendum)
Admission H&P    Chief Complaint: Acute onset of right hemiplegia and aphasia  HPI: JALYIAH PURYEAR is a 77 y.o. female with an extensive PMHx as documented below, including prior stroke, CHF, DM, dementia and HTN who initially presented to [facility] with acute onset of right hemiplegia and aphasia. LKN was 8:00 PM on Friday night. Family heard a thumping sound at 11 PM and went to the room she was in, where apparently she had just fallen. Above deficits were noted and EMS was called. No head trauma was reported. Teleneurology evaluated the patient there and she was determined not to be a TNK candidate due to time criteria. NIHSS was 20. CT head revealed focal hypodensity with ASPECTS of 8 and no hemorrhage. CTA revealed a left M1 occlusion. She was emergently transferred to [facility] for mechanical thrombectomy, which the patient's son Trey Paula, [phone]) consented to during telephone conversation with the Teleneurologist. Modified Rankin Score was determined to be 3 on the basis of disability requiring some help with ADLs at home. At baseline she can walk independently with a walker or a cane.   Past Medical History:  Diagnosis Date   Anemia    Anxiety    Arthritis    "all over" (12/08/2017)   Asthma    CHF (congestive heart failure) (HCC)    Chronic lower back pain    COPD (chronic obstructive pulmonary disease) (HCC)    Dementia (HCC)    Depression    Diabetes mellitus without complication (HCC)    Fibromyalgia    Gastric ulcer    GERD (gastroesophageal reflux disease)    Guillain Barr syndrome (HCC)    Hematemesis 12/07/2017   History of blood transfusion    "low blood" (12/08/2017)   History of stomach ulcers    Hypercholesterolemia    Hypertension    Migraine    "used to have them; don't have them anymore" (12/08/2017)   Pneumonia    "probably twice" (12/08/2017)   Sleep apnea    "couldn't take the mask" (12/08/2017)   Stroke (HCC)    "I don't remember if I have had one  or not; I believe I have" (12/08/2017)    Past Surgical History:  Procedure Laterality Date   ABDOMINAL HERNIA REPAIR     ABDOMINAL HYSTERECTOMY     APPENDECTOMY     BIOPSY  12/08/2017   Procedure: BIOPSY;  Surgeon: Lynann Bologna, MD;  Location[facility] ENDOSCOPY;  Service: Endoscopy;;   BREAST SURGERY Left    "tumors removed; not cancer"   BUNIONECTOMY Bilateral    CARPAL TUNNEL RELEASE Bilateral    CATARACT EXTRACTION, BILATERAL Bilateral    CHOLECYSTECTOMY     COLONOSCOPY  03/03/2011   Mild sigmoid diverticulosis. Internal hemorrhoids.    DILATION AND CURETTAGE OF UTERUS     ELBOW FRACTURE SURGERY Left    ESOPHAGOGASTRODUODENOSCOPY N/A 12/08/2017   Procedure: ESOPHAGOGASTRODUODENOSCOPY (EGD);  Surgeon: Lynann Bologna, MD;  Location: [facility] ENDOSCOPY;  Service: Endoscopy;  Laterality: N/A;   ESOPHAGOGASTRODUODENOSCOPY ENDOSCOPY  12/08/2017   w/bx   FRACTURE SURGERY     HERNIA REPAIR     HIP FRACTURE SURGERY Left    KNEE ARTHROSCOPY Right    SHOULDER OPEN ROTATOR CUFF REPAIR Bilateral    TONSILLECTOMY      No family history on file. Social History:  reports that she has never smoked. She has quit using smokeless tobacco.  Her smokeless tobacco use included chew. She reports that she does not drink alcohol and  does not use drugs.  Allergies:  Allergies  Allergen Reactions   Codeine Nausea And Vomiting   Lyrica [Pregabalin] Other (See Comments)    unknown   Penicillins Other (See Comments)    Childhood allergy     Medications Prior to Admission  Medication Sig Dispense Refill   ferrous sulfate 325 (65 FE) MG tablet Take 1 tablet (325 mg total) by mouth daily. 30 tablet 11   pantoprazole (PROTONIX) 40 MG tablet Take 1 tablet (40 mg total) by mouth 2 (two) times daily. 60 tablet 11    ROS: Unable to obtain due to aphasia.   Physical Examination: There were no vitals taken for this visit.  Physical Exam  HEENT-  [place]/AT. Foam/drool from right side of mouth.     Lungs- Respirations  unlabored Extremities- No edema   Neurological Examination Mental Status: Awake with mildly decreased level of alertness. Mild agitation. Right hemineglect. Dense expressive and receptive aphasia. Unable to follow commands or answer orientation questions.  Cranial Nerves: II: No blink to threat on the right. PERRL.   III,IV, VI: Leftward gaze preference but will cross midline to the right V: Decreased reactivity to right sided stimuli  VII: Right facial droop VIII: Alerts to voice IX,X: Gag reflex deferred XI: Preferential rotation of head to the left.  XII: Not extending tongue to command Motor: RUE 0/5 with flaccid tone RLE 2-3/5 to noxious stimuli LUE and LLE 5/5.  Sensory: Decreased reactivity to right sided stimuli but will react to pinch and noxious plantar stimulation on the right.   Deep Tendon Reflexes: Difficult to elicit due to patient movement and agitation.  Cerebellar: Not following commands for assessment  Gait: Unable to assess  NIHSS: 24  No results found for this or any previous visit (from the past 48 hour(s)). No results found.   Assessment: 77 y.o. female with an extensive PMHx as documented below, including prior stroke, CHF, DM, dementia and HTN who initially presented to [facility] with acute onset of right hemiplegia and aphasia. LKN was 8:00 PM on Friday night. Family heard a thumping sound at 11 PM and went to the room she was in, where apparently she had just fallen. Above deficits were noted and EMS was called. No head trauma was reported. Teleneurology evaluated the patient there and she was determined not to be a TNK candidate due to time criteria. NIHSS was 20. CT head revealed focal hypodensity with ASPECTS of 8 and no hemorrhage. CTA revealed a left M1 occlusion. She was emergently transferred to [facility] for mechanical thrombectomy, which the patient's son Trey Paula, [phone]) consented to during telephone conversation with the  Teleneurologist. Modified Rankin Score was determined to be 3 on the basis of disability requiring some help with ADLs at home. At baseline she can walk independently with a walker or a cane.  - Exam after arrival to [facility] reveals severe deficits localizing to the left cerebral hemisphere. NIHSS 24 - The patient is a VIR candidate. Risks/benefits of the procedure include an approximately 50% chance of significant improvement relative to an approximate 10% chance of subarachnoid hemorrhage with possibility of significant worsening including death.   - No signed paperwork documenting informed consent accompanied the patient. Consent was obtained by Teleneurology from her son over the telephone per verbal communication with the Teleneurologist.  - To ensure proper consent procedures given son being unavailable in person or by phone after arrival of the patient to [facility], 2-physician emergent decision to treat by Dr.  Deveshwar and Dr. Otelia Limes was made while at [facility] prior to thrombectomy. Form has been signed and is on file.   Plan: - Patient taken emergently to the cath lab for left M1 thrombectomy - Following VIR she will be admitted to the ICU under the Neurology service Admitting to Neuro ICU.  - Post-VIR order set to include frequent neuro checks and BP management.  - No antiplatelet medications or anticoagulants for at least 24 hours following VIR, unless stent placement is indicated.  - DVT prophylaxis with SCDs.  - Pharmacy consult to interrogate refill records and ensure that home medications listed in Epic are up to date - Will need to be started on antiplatelet therapy if follow up CT at 24 hours is negative for hemorrhagic conversion. - TTE.  - MRI brain if no contraindications  - PT/OT/Speech.  - NPO until passes swallow evaluation.  - Sliding scale insulin.  - Telemetry monitoring - Fasting lipid panel, HgbA1c  Addendum: - TICI3 reperfusion attained with thrombectomy. Stent placement was  required due to calcified stenosis.  - Unable to extubate after VIR. CCM has been consulted for ventilator management.  - Cangrelor infusion post-IR to run until 7:17 AM, then stop and obtain follow up CT of head. If follow up CT is negative for hemorrhagic conversion, then start Brilinta.  - Order placed for NGT placement to administer Brilinta.   45 minutes spent in the emergent neurological evaluation and management of this critically ill patient  Electronically signed: Dr. Caryl Pina 09/20/2022, 1:54 AM

## 2022-09-20 NOTE — Progress Notes (Signed)
Pt was transported to CT and back via ventilator with no apparent complications. Pt is currently stable RT will continue to monitor

## 2022-09-20 NOTE — Progress Notes (Signed)
  Echocardiogram 2D Echocardiogram has been performed.  Melissa [name] 09/20/2022, 3:00 PM

## 2022-09-20 NOTE — Progress Notes (Signed)
Referring Physician(s): Stroke team, Caryl Pina   Supervising Physician: Julieanne Cotton  Patient Status:  [facility] - In-pt  Chief Complaint:  Acute onset of right hemiplegia and aphasia, Lt MCA occlusion  S/p Lt MCA thrombectomy and stent placement by Dr. Corliss Skains this morning.  Subjective:  Patient laying in bed, intubated and sedated on propofol.  RN states that she moves all 4 ext and opens eyes.  She is off Cleviprex, patient has been having onsets of bradycardic followed by hypertension, but no other issues.   Allergies: Codeine, Lyrica [pregabalin], and Penicillins  Medications: Prior to Admission medications   Medication Sig Start Date End Date Taking? Authorizing Provider  isosorbide mononitrate (IMDUR) 30 MG 24 hr tablet Take 1 tablet by mouth daily. 12/15/17  Yes [provider]  omeprazole (PRILOSEC) 40 MG capsule Take 40 mg by mouth daily. 07/07/22  Yes [provider]  ferrous sulfate 325 (65 FE) MG tablet Take 1 tablet (325 mg total) by mouth daily. 12/09/17 12/09/18  Tyrone Nine, MD  pantoprazole (PROTONIX) 40 MG tablet Take 1 tablet (40 mg total) by mouth 2 (two) times daily. 12/09/17 12/09/18  Tyrone Nine, MD     Vital Signs: BP (!) 141/73   Pulse 78   Temp (!) 97.5 F (36.4 C) (Axillary)   Resp 18   Ht 5\' 2"  (1.575 m)   Wt 162 lb 14.7 oz (73.9 kg)   SpO2 96%   BMI 29.80 kg/m   Physical Exam Vitals reviewed.  Constitutional:      General: She is not in acute distress. HENT:     Head: Normocephalic.     Mouth/Throat:     Comments: Intubated  Cardiovascular:     Rate and Rhythm: Normal rate and regular rhythm.  Abdominal:     General: Abdomen is flat.     Palpations: Abdomen is soft.  Skin:    General: Skin is warm and dry.     Coloration: Skin is not jaundiced or pale.     Comments: Positive pressure dressing on R CFA puncture site. Site is unremarkable with no tenderness, bleeding or drainage. R DP 2+   Neurological:      Comments: Sedated, RN states that patient moves all 4 and opens eyes.     Imaging: DG Abd 1 View  Result Date: 09/20/2022 CLINICAL DATA:  Confirm orogastric tube placement. EXAM: ABDOMEN - 1 VIEW COMPARISON:  12/17/2017 and CT of the abdomen and pelvis on 12/18/2017 FINDINGS: Enteric tube has been placed, tip overlying the level of the proximal stomach. Side port is in the region of the gastroesophageal junction. Course of the tube in the thorax is consistent with hiatal hernia. Visualized bowel gas pattern is nonobstructive. IMPRESSION: Enteric tube tip overlies the level of the proximal stomach. Electronically Signed   By: Norva Pavlov M.D.   On: 09/20/2022 09:37   CT HEAD WO CONTRAST ( )  Result Date: 09/20/2022 CLINICAL DATA:  77 year old female code stroke presentation yesterday at [facility] left MCA M1 ELVO. Status post endovascular revascularization. EXAM: CT HEAD WITHOUT CONTRAST TECHNIQUE: Contiguous axial images were obtained from the base of the skull through the vertex without intravenous contrast. RADIATION DOSE REDUCTION: This exam was performed according to the departmental dose-optimization program which includes automated exposure control, adjustment of the mA and/or kV according to patient size and/or use of iterative reconstruction technique. COMPARISON:  Code stroke presentation head CT yesterday. FINDINGS: Brain: Residual intravascular contrast. Chronic left basal  ganglia lacunar infarct. Gray-white differentiation in both hemispheres appears stable since yesterday. No definite acute or evolving left MCA infarct by CT. No acute intracranial hemorrhage identified. No midline shift, mass effect, or evidence of intracranial mass lesion. No ventriculomegaly. Vascular: New left MCA M1 segment vascular stent. Calcified atherosclerosis at the skull base. Skull: No acute osseous abnormality identified. Sinuses/Orbits: Intubated now. Visualized paranasal sinuses and mastoids are  stable and well aerated. Other: No acute orbit or scalp soft tissue finding. IMPRESSION: 1. New Left MCA vascular stent and some residual intravascular contrast. 2. No definite acute or evolving infarct by CT. No acute intracranial hemorrhage or mass effect. 3. Chronic left basal ganglia lacunar infarct. Electronically Signed   By: Odessa Fleming M.D.   On: 09/20/2022 08:31   DG Chest Port 1 View  Result Date: 09/20/2022 CLINICAL DATA:  [id number], [id number].  Check NGT, ETT. EXAM: PORTABLE CHEST 1 VIEW COMPARISON:  CTA chest, abdomen and pelvis 01/27/2022, chest earlier today at 12:33 a.m. FINDINGS: 6:47 a.m. ETT interval insertion with the tip 3 cm from the carina. NGT is within a sizable hiatal hernia. Based on the prior CTA chest the sidehole is in the distal thoracic esophagus and should be advanced further in about 10 cm. On that study, just over 1/2 of the stomach was intrathoracic and inverted. The lungs are clear of infiltrates.  No pleural effusion is seen. There is stable moderate to severe cardiomegaly and central vascular fullness without evidence of acute CHF. The aortic arch is heavily calcified.  The mediastinum is stable. Osteopenia and degenerative change thoracic spine. IMPRESSION: 1. ETT interval insertion with the tip 3 cm from the carina. 2. NGT is within a sizable hiatal hernia. Based on the prior CTA chest the sidehole is in the distal thoracic esophagus and should be advanced further in about 10 cm. 3. No acute chest findings.  Stable cardiomegaly. Electronically Signed   By: Almira Bar M.D.   On: 09/20/2022 07:35    Labs:  CBC: Recent Labs    09/20/22 0218 09/20/22 0504 09/20/22 0514 09/20/22 0803  WBC  --  9.7  --   --   HGB 10.9* 10.0* 11.2* 10.9*  HCT 32.0* 32.6* 33.0* 32.0*  PLT  --  330  --   --     COAGS: Recent Labs    09/20/22 0504  INR 1.1  APTT 78*    BMP: Recent Labs    09/20/22 0218 09/20/22 0504 09/20/22 0514 09/20/22 0803  NA 137 135 138 140  K 4.1  3.4* 3.6 3.1*  CL 104 103  --   --   CO2  --  21*  --   --   GLUCOSE 134* 170*  --   --   BUN 9 5*  --   --   CALCIUM  --  8.8*  --   --   CREATININE 0.90 0.95  --   --   GFRNONAA  --  >60  --   --     LIVER FUNCTION TESTS: Recent Labs    09/20/22 0504  BILITOT 0.8  AST 18  ALT 15  ALKPHOS 97  PROT 6.5  ALBUMIN 3.5    Assessment and Plan:  77 y.o. 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 Corliss Skains this morning.  Patient was loaded with ASA 325 mg and placed on Cangrelor bolus  followed by infusion x 4 hrs  CT head this morning w/o acute intracranial hemorrhage or mass effect.  Remains intubated.  R CFA site w/o appreciable pseudoaneurysm, R DP 2+  DAPT with ASA 81 mg qd and Brilinta 90 mg BID.   Further treatment plan per Stroke team/PCCM  Appreciate and agree with the plan.  NIR to follow.    Electronically Signed: Willette Brace, PA-C 09/20/2022, 10:29 AM   I spent a total of 15 Minutes at the the patient's bedside AND on the patient's hospital floor or unit, greater than 50% of which was counseling/coordinating care for Lt MCA stent placement f/u.  This chart was dictated using voice recognition software.  Despite best efforts to proofread,  errors can occur which can change the documentation meaning.

## 2022-09-20 NOTE — Progress Notes (Addendum)
STROKE TEAM PROGRESS NOTE   INTERVAL HISTORY No family at the bedside.  RN at the bedside Patient is intubated on propofol at 15 mcg. Patient originally presented to [facility] for acute onset of right hemiplegia and aphasia.  CT head at outside hospital revealed focal hypodensity with aspects of 8.  CTA revealed a left M1 occlusion.  She was emergently transferred to [facility] for thrombectomy She underwent thrombectomy of left M1 TICI 3 revascularization, with placement of rescue stent due to reocclusion due to intracranial atherosclerosis and return of TICI 3 revascularization.  Was on cangrelor IV which was turned off this morning at 717 and then started on Brilinta.  Repeat CT head this morning with left MCA vascular stent and some residual intravascular contrast, no acute or evolving infarct  MRI brain ordered Overnight and this morning heart rate ranging anywhere from 30s-100s in A-fib with pauses will consult cardiology  Vitals:   09/20/22 1200 09/20/22 1300 09/20/22 1351 09/20/22 1400  BP: (!) 118/50 (!) 107/58  112/62  Pulse: 63 63 (!) 54 (!) 56  Resp: (!) 24 (!) 24 (!) 24 (!) 24  Temp:      TempSrc:      SpO2: 98% 98% 99% 99%  Weight:      Height:       CBC:  Recent Labs  Lab 09/20/22 0504 09/20/22 0514 09/20/22 0803 09/20/22 1220  WBC 9.7  --   --   --   NEUTROABS 8.8*  --   --   --   HGB 10.0*   < > 10.9* 10.2*  HCT 32.6*   < > 32.0* 30.0*  MCV 77.1*  --   --   --   PLT 330  --   --   --    < > = values in this interval not displayed.   Basic Metabolic Panel:  Recent Labs  Lab 09/20/22 0218 09/20/22 0504 09/20/22 0514 09/20/22 0803 09/20/22 1043 09/20/22 1220  NA 137 135   < > 140  --  139  K 4.1 3.4*   < > 3.1*  --  3.9  CL 104 103  --   --   --   --   CO2  --  21*  --   --   --   --   GLUCOSE 134* 170*  --   --   --   --   BUN 9 5*  --   --   --   --   CREATININE 0.90 0.95  --   --   --   --   CALCIUM  --  8.8*  --   --   --   --    MG  --   --   --   --  1.8  --   PHOS  --   --   --   --  3.6  --    < > = values in this interval not displayed.   Lipid Panel:  Recent Labs  Lab 09/20/22 0504  CHOL 192  TRIG 92  HDL 54  CHOLHDL 3.6  VLDL 18  LDLCALC 865*   HgbA1c: No results for input(s): "HGBA1C" in the last 168 hours. Urine Drug Screen: No results for input(s): "LABOPIA", "COCAINSCRNUR", "LABBENZ", "AMPHETMU", "THCU", "LABBARB" in the last 168 hours.  Alcohol Level No results for input(s): "ETH" in the last 168 hours.  IMAGING past 24 hours DG Abd 1 View  Result Date:  09/20/2022 CLINICAL DATA:  Confirm orogastric tube placement. EXAM: ABDOMEN - 1 VIEW COMPARISON:  12/17/2017 and CT of the abdomen and pelvis on 12/18/2017 FINDINGS: Enteric tube has been placed, tip overlying the level of the proximal stomach. Side port is in the region of the gastroesophageal junction. Course of the tube in the thorax is consistent with hiatal hernia. Visualized bowel gas pattern is nonobstructive. IMPRESSION: Enteric tube tip overlies the level of the proximal stomach. Electronically Signed   By: Norva Pavlov M.D.   On: 09/20/2022 09:37   CT HEAD WO CONTRAST ( )  Result Date: 09/20/2022 CLINICAL DATA:  77 year old female code stroke presentation yesterday at [facility] left MCA M1 ELVO. Status post endovascular revascularization. EXAM: CT HEAD WITHOUT CONTRAST TECHNIQUE: Contiguous axial images were obtained from the base of the skull through the vertex without intravenous contrast. RADIATION DOSE REDUCTION: This exam was performed according to the departmental dose-optimization program which includes automated exposure control, adjustment of the mA and/or kV according to patient size and/or use of iterative reconstruction technique. COMPARISON:  Code stroke presentation head CT yesterday. FINDINGS: Brain: Residual intravascular contrast. Chronic left basal ganglia lacunar infarct. Gray-white differentiation in both  hemispheres appears stable since yesterday. No definite acute or evolving left MCA infarct by CT. No acute intracranial hemorrhage identified. No midline shift, mass effect, or evidence of intracranial mass lesion. No ventriculomegaly. Vascular: New left MCA M1 segment vascular stent. Calcified atherosclerosis at the skull base. Skull: No acute osseous abnormality identified. Sinuses/Orbits: Intubated now. Visualized paranasal sinuses and mastoids are stable and well aerated. Other: No acute orbit or scalp soft tissue finding. IMPRESSION: 1. New Left MCA vascular stent and some residual intravascular contrast. 2. No definite acute or evolving infarct by CT. No acute intracranial hemorrhage or mass effect. 3. Chronic left basal ganglia lacunar infarct. Electronically Signed   By: Odessa Fleming M.D.   On: 09/20/2022 08:31   DG Chest Port 1 View  Result Date: 09/20/2022 CLINICAL DATA:  [id number], [id number].  Check NGT, ETT. EXAM: PORTABLE CHEST 1 VIEW COMPARISON:  CTA chest, abdomen and pelvis 01/27/2022, chest earlier today at 12:33 a.m. FINDINGS: 6:47 a.m. ETT interval insertion with the tip 3 cm from the carina. NGT is within a sizable hiatal hernia. Based on the prior CTA chest the sidehole is in the distal thoracic esophagus and should be advanced further in about 10 cm. On that study, just over 1/2 of the stomach was intrathoracic and inverted. The lungs are clear of infiltrates.  No pleural effusion is seen. There is stable moderate to severe cardiomegaly and central vascular fullness without evidence of acute CHF. The aortic arch is heavily calcified.  The mediastinum is stable. Osteopenia and degenerative change thoracic spine. IMPRESSION: 1. ETT interval insertion with the tip 3 cm from the carina. 2. NGT is within a sizable hiatal hernia. Based on the prior CTA chest the sidehole is in the distal thoracic esophagus and should be advanced further in about 10 cm. 3. No acute chest findings.  Stable cardiomegaly.  Electronically Signed   By: Almira Bar M.D.   On: 09/20/2022 07:35    PHYSICAL EXAM  Temp:  [97.5 F (36.4 C)] 97.5 F (36.4 C) (05/18 0800) Pulse Rate:  [46-84] 56 (05/18 1400) Resp:  [15-24] 24 (05/18 1400) BP: (107-178)/(50-81) 112/62 (05/18 1400) SpO2:  [94 %-99 %] 99 % (05/18 1400) Arterial Line BP: (114-145)/(45-62) 128/52 (05/18 1400) FiO2 (%):  [40 %] 40 % (05/18 1218) Weight:  [73.9  kg] 73.9 kg (05/18 0452)  General -critically ill female intubated, on propofol in no apparent distress Cardiovascular -A-fib on monitor  Mental Status -  Propofol was paused for exam.  Patient is intubated she opens her eyes to noxious stimuli she does follow some simple commands.  Pupils equal round reactive.  Corneal reflex intact.  Unable to assess facial asymmetry due to ET tube.  Bilaterally she withdraws bilateral lower extremities.  Left arm is purposeful with spontaneous movement.  Right arm is antigravity  Cranial Nerves II - XII - II - Visual field intact OU. III, IV, VI - Extraocular movements intact. V - Facial sensation intact bilaterally. VII -unable to assess due to ET tube VIII - Hearing & vestibular intact bilaterally. XII - Tongue protrusion intact.  Motor Strength - Bilaterally she withdraws bilateral lower extremities.  Left arm is purposeful with spontaneous movement.  Right arm is antigravity  Motor Tone - Muscle tone was assessed at the neck and appendages and was normal.  Sensory -response to noxious stimuli  Coordination -able to assess  Gait and Station - deferred.  ASSESSMENT/PLAN Melissa [name] is a 77 y.o. female with history of A-fib not on anticoagulation, hypertension, hyperlipidemia, CHF, diabetes, dementia, recurrent GI bleed, anxiety and depression, COPD, history of Guillain-Barr, migraine history, sleep apnea not on CPAP, prior stroke history, GERD, fibromyalgia who initially presented to [facility] with acute onset of right  hemiplegia and aphasia. LKN was 8:00 PM on Friday night. Family heard a thumping sound at 11 PM and went to the room she was in, where apparently she had just fallen. Above deficits were noted and EMS was called. No head trauma was reported. Teleneurology evaluated the patient there and she was determined not to be a TNK candidate due to time criteria. NIHSS was 20. CT head revealed focal hypodensity with ASPECTS of 8 and no hemorrhage. CTA revealed a left M1 occlusion. She was emergently transferred to [facility] for mechanical thrombectomy,   Stroke: Acute left MCA ischemic infarct s/p mechanical thrombectomy of left M1 with TICI 3, s/p rescue stent due to reocclusion with return to TICI 3 Etiology: Cardioembolic on no anticoagulation, and intracranial atherosclerosis CT head at outside hospital with focal hypodensity.  Aspects of 8 CTA head & neck at outside hospital revealed a left M1 occlusion Cerebral angio s/p complete revascularization of left M1 1 with TICI 3, s/p placement of rescue stent due to reocclusion secondary to underlying intracranial atherosclerosis with return to TICI 3 Post IR CT no hemorrhage Repeat CT head with no acute infarct MRI ordered 2D Echo ordered LDL 120 HgbA1c ordered VTE prophylaxis -SCDs    Diet   Diet NPO time specified   No antithrombotics prior to admission, now on on aspirin 81 mg daily and Brilinta 90 mg twice daily Therapy recommendations: Pending Disposition: Pending  Dementia Anxiety and depression Fibromyalgia Noted   Hypertension Chronic A-fib on no anticoagulation CAD Chronic diastolic CHF Home meds: Imdur 30 mg Unstable Cleviprex drip currently off BP goal 1 20-1 40 for 24 hours after thrombectomy Long-term BP goal normotensive Will consult cardiology for bradycardia and pauses  Hyperlipidemia Home meds: None LDL 120, goal < 70 Add Crestor 40 mg Continue statin at discharge  Acute hypoxic respiratory failure status post surgical  procedure COPD Asthma Management per CCM On propofol for sedation  Diabetes type II Controlled Home meds: None HgbA1c ordered., goal < 7.0 CBGs Recent Labs    09/20/22 0804 09/20/22 1155  GLUCAP 191* 146*    SSI  Dysphagia History of GI bleed secondary to PUD Hiatal hernia GERD OG tube placed Consider starting tube feeds in the next day or so Speech therapy to follow Nutrition consult for tube feeds Protonix  Other Stroke Risk Factors Advanced Age >/= 33  Hx stroke/TIA CAD Migraines Obstructive sleep apnea, not on CPAP at home Chronic CHF  Other Active Problems Chronic anemia-Hgb 10.  Will monitor Hypokalemia-K3.4.  Will replace and check in the morning  Hospital day # 0  Gevena Mart DNP, ACNPC-AG  [facility]  ATTENDING ATTESTATION:  Patient with acute left MCA stroke status post IR and left MCA stent placement.  Was on IV cangrelor.  This morning transition to Brilinta after repeat head CT confirmed no bleed.  She is currently intubated but is following commands.  Discussed with Dr. De Hollingshead possible extubation.  He will conduct a weaning trial.  Cardiology consulted.  Continue ongoing stroke workup and ICU management.  Appreciate CCM and cardiology assistance.  Dr. Viviann Spare evaluated pt independently, reviewed imaging, chart, labs. Discussed and formulated plan with the Resident/APP. Changes were made to the note where appropriate. Please see APP/resident note above for details.     This patient is critically ill due to respiratory distress, stroke s/p IR with stent placement and at significant risk of neurological worsening, death form heart failure, respiratory failure, recurrent stroke, bleeding from [facility], seizure, sepsis. This patient's care requires constant monitoring of vital signs, hemodynamics, respiratory and cardiac monitoring, review of multiple databases, neurological assessment, discussion with family, other specialists and medical decision  making of high complexity. I spent 35 minutes of neurocritical care time in the care of this patient.   Felicita Nuncio,MD   To contact Stroke Continuity provider, please refer to [email or web address]. After hours, contact General Neurology

## 2022-09-20 NOTE — Progress Notes (Signed)
OT Cancellation Note  Patient Details Name: ENGLISH ACKER MRN: [id number] DOB: 11/12/1945   Cancelled Treatment:    Reason Eval/Treat Not Completed: Medical issues which prohibited therapy.  OT to continue efforts.   Jw Covin D Olsen Mccutchan 09/20/2022, 2:03 PM 09/20/2022  RP, OTR/L  Acute Rehabilitation Services  Office:  [phone]

## 2022-09-20 NOTE — Progress Notes (Signed)
eLink Physician-Brief Progress Note Patient Name: Melissa [name] DOB: Sep 24, 1945 MRN: [id number]   Date of Service  09/20/2022  HPI/Events of Note  Patient with CVA secondary to left MCA M1 occlusion s/p IR thrombectomy, admitted to the ICU post-procedure intubated and mechanically ventilated.  eICU Interventions  New Patient Evaluation.        Thomasene Lot [name] 09/20/2022, 5:10 AM

## 2022-09-20 NOTE — Consult Note (Addendum)
NAME:  NATHAN GOPALAKRISHNAN, MRN:  [id number], DOB:  06/26/1945, LOS: 0 ADMISSION DATE:  09/20/2022, CONSULTATION DATE:  09/20/2022 REFERRING MD:  Dr. Corliss Skains, Neuro-IR, CHIEF COMPLAINT:  Rt sided weakness   History of Present Illness:  77 yo female from home presented to [facility] ER with AMS, slurred speech, Rt sided weakness, Lt gaze preference, productive cough with concern for aspiration.  CT head concerning for Lt M1 occlusion.  She was transferred to [facility] and had revascularization and stenting.  She remained intubated after the procedure.  PCCM consulted to assist with management in ICU.  Pertinent  Medical History  Anemia, Anxiety, OA, Asthma, CHF, COPD, Back pain, Dementia, Depression, DM type 2, Fibromyalgia, PUD, GERD, GBS, HLD, HTN, Migraine headache, Pneumonia, OSA, CVA, IDA, CAD, A fib  Significant Hospital Events: Including procedures, antibiotic start and stop dates in addition to other pertinent events   5/18 Admit, Lt common carotid arteriogram with revascularization of M1  Interim History / Subjective:  Hx from chart and medical team  Objective   Weight 73.9 kg, SpO2 95 %.    Vent Mode: PRVC FiO2 (%):  [40 %] 40 % Set Rate:  [15 bmp-18 bmp] 18 bmp Vt Set:  [400 mL] 400 mL PEEP:  [5 cmH20] 5 cmH20 Plateau Pressure:  [15 cmH20] 15 cmH20   Intake/Output Summary (Last 24 hours) at 09/20/2022 0519 Last data filed at 09/20/2022 8469 Gross per 24 hour  Intake 1750 ml  Output 570 ml  Net 1180 ml   Filed Weights   09/20/22 0452  Weight: 73.9 kg    Examination:  General - sedated Eyes - pupils reactive ENT - ETT in place Cardiac - irregular Chest - equal breath sounds b/l, no wheezing or rales Abdomen - soft, non tender, + bowel sounds Extremities - no cyanosis, clubbing, or edema Skin - no rashes Neuro - RASS -3  Labs from [facility] reviewed: WBC 4.1, Hb 10.8, PLT 347, Na 138, K 4.1, CO2 24, Glucose 121, Creatinine 0.9  Resolved Hospital Problem list      Assessment & Plan:   Acute CVA s/p revascularization of Lt M1. Hx of Dementia, CVA, Anxiety, Depression, Fibromyalgia. - per neurology and neuro-IR  Compromised airway in setting of CVA with possible aspiration. Hx of COPD, Asthma, OSA. - full vent support until neuro status more stable - f/u CXR, ABG - goal SpO2 > 92%  Hypertension. Chronic diastolic CHF, Chronic A fib, CAD, HLD. - continue cleviprex for goal SBP 120 to 140 per neuro IR - f/u Echo  Hx of Upper GI bleeding from PUD, GERD, Hiatal hernia. - not on anticoagulation as outpt due to recurrent GI bleeding - continue protonix and monitor for GI bleeding  Difficulty placing OG tube. - might be related to mod/large hiatal hernia - placed OG tube at bedside in ICU and will check chest xray to determine placement of OG tube; if not in good position, then might need radiology to assist with placement  DM type 2 poorly controlled with hyperglycemia. - SSI  Best Practice (right click and "Reselect all SmartList Selections" daily)   Diet/type: NPO DVT prophylaxis: SCD GI prophylaxis: PPI Lines: N/A Foley:  N/A Code Status:  full code Last date of multidisciplinary goals of care discussion [x]   Labs   CBC: No results for input(s): "WBC", "NEUTROABS", "HGB", "HCT", "MCV", "PLT" in the last 168 hours.  Basic Metabolic Panel: No results for input(s): "NA", "K", "CL", "CO2", "GLUCOSE", "BUN", "CREATININE", "CALCIUM", "MG", "PHOS"  in the last 168 hours. GFR: CrCl cannot be calculated (Patient's most recent lab result is older than the maximum 21 days allowed.). No results for input(s): "PROCALCITON", "WBC", "LATICACIDVEN" in the last 168 hours.  Liver Function Tests: No results for input(s): "AST", "ALT", "ALKPHOS", "BILITOT", "PROT", "ALBUMIN" in the last 168 hours. No results for input(s): "LIPASE", "AMYLASE" in the last 168 hours. No results for input(s): "AMMONIA" in the last 168 hours.  ABG    Component  Value Date/Time   TCO2 22 12/07/2017 0442     Coagulation Profile: No results for input(s): "INR", "PROTIME" in the last 168 hours.  Cardiac Enzymes: No results for input(s): "CKTOTAL", "CKMB", "CKMBINDEX", "TROPONINI" in the last 168 hours.  HbA1C: Hgb A1c MFr Bld  Date/Time Value Ref Range Status  12/07/2017 09:16 AM 6.3 (H) 4.8 - 5.6 % Final    Comment:    (NOTE) Pre diabetes:          5.7%-6.4% Diabetes:              >6.4% Glycemic control for   <7.0% adults with diabetes     CBG: No results for input(s): "GLUCAP" in the last 168 hours.  Review of Systems:   Unable to obtain  Past Medical History:  She,  has a past medical history of Anemia, Anxiety, Arthritis, Asthma, CHF (congestive heart failure) (HCC), Chronic lower back pain, COPD (chronic obstructive pulmonary disease) (HCC), Dementia (HCC), Depression, Diabetes mellitus without complication (HCC), Fibromyalgia, Gastric ulcer, GERD (gastroesophageal reflux disease), Guillain Barr syndrome (HCC), Hematemesis (12/07/2017), History of blood transfusion, History of stomach ulcers, Hypercholesterolemia, Hypertension, Migraine, Pneumonia, Sleep apnea, and Stroke (HCC).   Surgical History:   Past Surgical History:  Procedure Laterality Date   ABDOMINAL HERNIA REPAIR     ABDOMINAL HYSTERECTOMY     APPENDECTOMY     BIOPSY  12/08/2017   Procedure: BIOPSY;  Surgeon: Lynann Bologna, MD;  Location: [facility] ENDOSCOPY;  Service: Endoscopy;;   BREAST SURGERY Left    "tumors removed; not cancer"   BUNIONECTOMY Bilateral    CARPAL TUNNEL RELEASE Bilateral    CATARACT EXTRACTION, BILATERAL Bilateral    CHOLECYSTECTOMY     COLONOSCOPY  03/03/2011   Mild sigmoid diverticulosis. Internal hemorrhoids.    DILATION AND CURETTAGE OF UTERUS     ELBOW FRACTURE SURGERY Left    ESOPHAGOGASTRODUODENOSCOPY N/A 12/08/2017   Procedure: ESOPHAGOGASTRODUODENOSCOPY (EGD);  Surgeon: Lynann Bologna, MD;  Location[facility] ENDOSCOPY;  Service: Endoscopy;   Laterality: N/A;   ESOPHAGOGASTRODUODENOSCOPY ENDOSCOPY  12/08/2017   w/bx   FRACTURE SURGERY     HERNIA REPAIR     HIP FRACTURE SURGERY Left    KNEE ARTHROSCOPY Right    SHOULDER OPEN ROTATOR CUFF REPAIR Bilateral    TONSILLECTOMY       Social History:   reports that she has never smoked. She has quit using smokeless tobacco.  Her smokeless tobacco use included chew. She reports that she does not drink alcohol and does not use drugs.   Family History:  Her family history is not on file.   Allergies Allergies  Allergen Reactions   Codeine Nausea And Vomiting   Lyrica [Pregabalin] Other (See Comments)    unknown   Penicillins Other (See Comments)    Childhood allergy     Home Medications:  Lasix 20 mg daily Norvasc 10 mg daily Isosorbide mononitrate ER 30 mg daily Albuterol 1 puff q6h prn Omeprazole 40 mg daily Zofran 4 mg q8h prn  Critical care  time: 41 minutes  Coralyn Helling, MD [facility] Pulmonary/Critical Care Pager - [phone] or [phone] 09/20/2022, 5:27 AM

## 2022-09-20 NOTE — Procedures (Signed)
INR.  Status post LT  common carotid arteriogram.  Right CFA approach.    Findings.  1.Occluded left middle cerebral artery M1 segment at its origin.  Status post complete revascularization of occluded left middle cerebral artery M1 segment with 1 pass with the 070 free climb aspiration catheter achieving a TICI 3 revascularization.  Status post placement of a rescue stent due to reocclusion secondary to underlying intracranial arteriosclerosis with return toTICI 3 revascularization .  Post CT of the brain demonstrates no evidence of intracranial hemorrhage or mass effect.  8French Angio-Seal closure device deployed at the right groin puncture site.  Distal pulses all dopplerable unchanged from prior to procedure.  Medications given aspirin 325 mg per rectally at the end of the procedure.  Cangrelor bolus dose 15 mcg /kg followed by 2 mcg/kg infusion over 4 hours.  CT brain to be obtained at the termination of cangrelor at 7:17 AM.(Order placed)  Patient  left intubated due to her medical accommodation.  Fatima Sanger MD.

## 2022-09-20 NOTE — Anesthesia Postprocedure Evaluation (Signed)
Anesthesia Post Note  Patient: Melissa [name]  Procedure(s) Performed: IR WITH ANESTHESIA     Patient location during evaluation: SICU Anesthesia Type: General Level of consciousness: sedated Pain management: pain level controlled Vital Signs Assessment: post-procedure vital signs reviewed and stable Respiratory status: patient remains intubated per anesthesia plan Cardiovascular status: stable Postop Assessment: no apparent nausea or vomiting Anesthetic complications: no  No notable events documented.  Last Vitals:  Vitals:   09/20/22 0645 09/20/22 0700  BP: (!) 141/62 (!) 141/73  Pulse: 84 78  Resp: 20 18  SpO2: 95% 94%    Last Pain: There were no vitals filed for this visit.               Thomasene Dubow,W. EDMOND

## 2022-09-20 NOTE — Anesthesia Preprocedure Evaluation (Signed)
Anesthesia Evaluation  Patient identified by MRN, date of birth, ID band Patient awake    Reviewed: Allergy & Precautions, H&P , NPO status , Patient's Chart, lab work & pertinent test results  Airway Mallampati: Unable to assess       Dental no notable dental hx. (+) Edentulous Upper, Edentulous Lower, Dental Advisory Given   Pulmonary asthma , COPD   Pulmonary exam normal breath sounds clear to auscultation       Cardiovascular hypertension,  Rhythm:Irregular Rate:Tachycardia     Neuro/Psych  Headaches  Anxiety Depression   Dementia CVA, Residual Symptoms    GI/Hepatic Neg liver ROS, PUD,GERD  Medicated,,  Endo/Other  diabetes    Renal/GU Renal InsufficiencyRenal disease  negative genitourinary   Musculoskeletal  (+) Arthritis , Osteoarthritis,  Fibromyalgia -  Abdominal   Peds  Hematology  (+) Blood dyscrasia, anemia   Anesthesia Other Findings   Reproductive/Obstetrics negative OB ROS                             Anesthesia Physical Anesthesia Plan  ASA: 3 and emergent  Anesthesia Plan: General   Post-op Pain Management: Minimal or no pain anticipated   Induction: Intravenous, Rapid sequence and Cricoid pressure planned  PONV Risk Score and Plan: 4 or greater and Ondansetron, Dexamethasone and Treatment may vary due to age or medical condition  Airway Management Planned: Oral ETT  Additional Equipment: Arterial line  Intra-op Plan:   Post-operative Plan: Extubation in OR and Possible Post-op intubation/ventilation  Informed Consent: I have reviewed the patients History and Physical, chart, labs and discussed the procedure including the risks, benefits and alternatives for the proposed anesthesia with the patient or authorized representative who has indicated his/her understanding and acceptance.     Dental advisory given  Plan Discussed with: CRNA  Anesthesia Plan  Comments:        Anesthesia Quick Evaluation

## 2022-09-20 NOTE — Progress Notes (Signed)
PT Cancellation Note  Patient Details Name: Melissa [name] MRN: [id number] DOB: [birth year]   Cancelled Treatment:    Reason Eval/Treat Not Completed: Medical issues which prohibited therapy. Pt remains intubated, PT will follow up tomorrow.   Arlyss Gandy 09/20/2022, 1:17 PM

## 2022-09-20 NOTE — Progress Notes (Signed)
NAME:  Melissa [name], MRN:  [id number], DOB:  Dec 03, 1945, LOS: 0 ADMISSION DATE:  09/20/2022, CONSULTATION DATE:  09/20/2022 REFERRING MD:  Dr. Corliss Skains, Neuro-IR, CHIEF COMPLAINT:  Rt sided weakness   History of Present Illness:   77 yo female from home presented to [facility] ER with AMS, slurred speech, Rt sided weakness, Lt gaze preference, productive cough with concern for aspiration.  CT head concerning for Lt M1 occlusion.  She was transferred to [facility] and had revascularization and stenting.  She remained intubated after the procedure.  PCCM consulted to assist with management in ICU.  Pertinent  Medical History  Anemia, Anxiety, OA, Asthma, CHF, COPD, Back pain, Dementia, Depression, DM type 2, Fibromyalgia, PUD, GERD, GBS, HLD, HTN, Migraine headache, Pneumonia, OSA, CVA, IDA, CAD, A fib  Significant Hospital Events: Including procedures, antibiotic start and stop dates in addition to other pertinent events   5/18 Admit, Lt common carotid arteriogram with revascularization of M1  Interim History / Subjective:   Remains on the ventilator.  No acute events overnight.  Objective   Blood pressure (!) 141/73, pulse 78, resp. rate 18, weight 73.9 kg, SpO2 94 %.    Vent Mode: PRVC FiO2 (%):  [40 %] 40 % Set Rate:  [15 bmp-18 bmp] 18 bmp Vt Set:  [400 mL] 400 mL PEEP:  [5 cmH20] 5 cmH20 Plateau Pressure:  [15 cmH20] 15 cmH20   Intake/Output Summary (Last 24 hours) at 09/20/2022 7829 Last data filed at 09/20/2022 0700 Gross per 24 hour  Intake 1895.91 ml  Output 610 ml  Net 1285.91 ml   Filed Weights   09/20/22 0452  Weight: 73.9 kg    Examination: Gen:      No acute distress HEENT:  EOMI, sclera anicteric Neck:     No masses; no thyromegaly, ETT Lungs:    Clear to auscultation bilaterally; normal respiratory effort CV:         Regular rate and rhythm; no murmurs Abd:      + bowel sounds; soft, non-tender; no palpable masses, no distension Ext:    No edema; adequate  peripheral perfusion Skin:      Warm and dry; no rash Neuro: Sedated  Labs/imaging reviewed Significant for potassium 3.1, BUN/creatinine 5/0.95 WBC 9.7, hemoglobin 10.0, platelets 330   Resolved Hospital Problem list     Assessment & Plan:  Acute CVA s/p revascularization of Lt M1. Hx of Dementia, CVA, Anxiety, Depression, Fibromyalgia. Per neurology and neuro-IR  Compromised airway in setting of CVA with possible aspiration. Hx of COPD, Asthma, OSA. - full vent support until neuro status more stable - Intermittent X ray  Hypertension. Chronic diastolic CHF, Chronic A fib, CAD, HLD. - continue cleviprex for goal SBP 120 to 140 per neuro IR - f/u Echo  Hx of Upper GI bleeding from PUD, GERD, Hiatal hernia. - not on anticoagulation as outpt due to recurrent GI bleeding - continue protonix and monitor for GI bleeding  Difficulty placing OG tube. - might be related to mod/large hiatal hernia - Advance OG tube. May need IR for help with placement tomorrow.  DM type 2 poorly controlled with hyperglycemia. - SSI  Best Practice (right click and "Reselect all SmartList Selections" daily)   Diet/type: NPO DVT prophylaxis: SCD GI prophylaxis: PPI Lines: N/A Foley:  N/A Code Status:  full code Last date of multidisciplinary goals of care discussion [x]   Critical care time:    The patient is critically ill with multiple organ system failure  and requires high complexity decision making for assessment and support, frequent evaluation and titration of therapies, advanced monitoring, review of radiographic studies and interpretation of complex data.   Critical Care Time devoted to patient care services, exclusive of separately billable procedures, described in this note is 35 minutes.   Chilton Greathouse MD [facility] See Amion for pager  If no response to pager , please call [phone] until 7pm After 7:00 pm call Elink  [phone] 09/20/2022, 8:44  AM

## 2022-09-20 NOTE — Anesthesia Procedure Notes (Addendum)
Procedure Name: Intubation Date/Time: 09/20/2022 2:00 AM  Performed by: Tressia Miners, CRNAPre-anesthesia Checklist: Patient identified, Emergency Drugs available, Suction available, Patient being monitored and Timeout performed Patient Re-evaluated:Patient Re-evaluated prior to induction Oxygen Delivery Method: Circle system utilized Preoxygenation: Pre-oxygenation with 100% oxygen Induction Type: IV induction, Rapid sequence and Cricoid Pressure applied Laryngoscope Size: Mac and 3 Grade View: Grade I Tube type: Oral Tube size: 7.0 mm Number of attempts: 1 Airway Equipment and Method: Stylet Placement Confirmation: ETT inserted through vocal cords under direct vision, positive ETCO2 and breath sounds checked- equal and bilateral Secured at: 21 cm Tube secured with: Tape Dental Injury: Teeth and Oropharynx as per pre-operative assessment  Comments: Smooth IV Induction. Eyes taped. RSI Performed. DL x 1 with grade 1 view. Atraumatically placed, teeth and lip remain intact as pre-op. Secured with tape. Bilateral breath sounds +/=, EtCO2 +, Adequate TV, VSS.

## 2022-09-20 NOTE — Transfer of Care (Signed)
Immediate Anesthesia Transfer of Care Note  Patient: Melissa [name]  Procedure(s) Performed: IR WITH ANESTHESIA  Patient Location: ICU  Anesthesia Type:General  Level of Consciousness: Patient remains intubated per anesthesia plan  Airway & Oxygen Therapy: Patient remains intubated per anesthesia plan and Patient placed on Ventilator (see vital sign flow sheet for setting)  Post-op Assessment: Report given to RN and Post -op Vital signs reviewed and stable  Post vital signs: Reviewed and stable  Last Vitals:  Vitals Value Taken Time  BP 131/45 09/20/22 0449  Temp    Pulse 84 09/20/22 0459  Resp 15 09/20/22 0459  SpO2 95 % 09/20/22 0459  Vitals shown include unvalidated device data.  Last Pain: There were no vitals filed for this visit.       Complications: No notable events documented.

## 2022-09-21 ENCOUNTER — Inpatient Hospital Stay (HOSPITAL_COMMUNITY): Payer: 59

## 2022-09-21 DIAGNOSIS — I63512 Cerebral infarction due to unspecified occlusion or stenosis of left middle cerebral artery: Secondary | ICD-10-CM | POA: Diagnosis not present

## 2022-09-21 DIAGNOSIS — I724 Aneurysm of artery of lower extremity: Secondary | ICD-10-CM

## 2022-09-21 DIAGNOSIS — I63412 Cerebral infarction due to embolism of left middle cerebral artery: Secondary | ICD-10-CM | POA: Diagnosis not present

## 2022-09-21 LAB — HEMOGLOBIN A1C
Hgb A1c MFr Bld: 5.5 % (ref 4.8–5.6)
Mean Plasma Glucose: 111.15 mg/dL

## 2022-09-21 LAB — CBC
HCT: 28.5 % — ABNORMAL LOW (ref 36.0–46.0)
Hemoglobin: 8.7 g/dL — ABNORMAL LOW (ref 12.0–15.0)
MCH: 23.5 pg — ABNORMAL LOW (ref 26.0–34.0)
MCHC: 30.5 g/dL (ref 30.0–36.0)
MCV: 77 fL — ABNORMAL LOW (ref 80.0–100.0)
Platelets: 290 10*3/uL (ref 150–400)
RBC: 3.7 MIL/uL — ABNORMAL LOW (ref 3.87–5.11)
RDW: 26 % — ABNORMAL HIGH (ref 11.5–15.5)
WBC: 7.8 10*3/uL (ref 4.0–10.5)
nRBC: 0 % (ref 0.0–0.2)

## 2022-09-21 LAB — BASIC METABOLIC PANEL
Anion gap: 9 (ref 5–15)
BUN: 14 mg/dL (ref 8–23)
CO2: 19 mmol/L — ABNORMAL LOW (ref 22–32)
Calcium: 8.8 mg/dL — ABNORMAL LOW (ref 8.9–10.3)
Chloride: 107 mmol/L (ref 98–111)
Creatinine, Ser: 1.02 mg/dL — ABNORMAL HIGH (ref 0.44–1.00)
GFR, Estimated: 56 mL/min — ABNORMAL LOW (ref >60)
Glucose, Bld: 122 mg/dL — ABNORMAL HIGH (ref 70–99)
Potassium: 4.6 mmol/L (ref 3.5–5.1)
Sodium: 135 mmol/L (ref 135–145)

## 2022-09-21 LAB — MAGNESIUM: Magnesium: 2.1 mg/dL (ref 1.7–2.4)

## 2022-09-21 LAB — TRIGLYCERIDES: Triglycerides: 86 mg/dL (ref <150)

## 2022-09-21 LAB — GLUCOSE, CAPILLARY
Glucose-Capillary: 124 mg/dL — ABNORMAL HIGH (ref 70–99)
Glucose-Capillary: 115 mg/dL — ABNORMAL HIGH (ref 70–99)
Glucose-Capillary: 120 mg/dL — ABNORMAL HIGH (ref 70–99)
Glucose-Capillary: 127 mg/dL — ABNORMAL HIGH (ref 70–99)
Glucose-Capillary: 126 mg/dL — ABNORMAL HIGH (ref 70–99)

## 2022-09-21 LAB — PHOSPHORUS: Phosphorus: 4.4 mg/dL (ref 2.5–4.6)

## 2022-09-21 MED ORDER — LACTATED RINGERS IV BOLUS
500.0000 mL | Freq: Once | INTRAVENOUS | Status: AC
  Administered 2022-09-21: 500 mL via INTRAVENOUS

## 2022-09-21 NOTE — Progress Notes (Signed)
PT Cancellation Note  Patient Details Name: Melissa [name] MRN: [id number] DOB: 1946-01-19   Cancelled Treatment:    Reason Eval/Treat Not Completed: Other (comment) Per MD note PT to hold pending u/s of groin CFA site.  Will f/u as able.  Melissa [name], PT Acute Rehab [facility] [phone]   Rayetta Humphrey 09/21/2022, 3:20 PM

## 2022-09-21 NOTE — Evaluation (Signed)
Speech Language Pathology Evaluation Patient Details Name: Melissa [name] MRN: [id number] DOB: 09/26/1945 Today's Date: 09/21/2022 Time: 7829-5621 SLP Time Calculation (min) (ACUTE ONLY): 20 min  Problem List:  Patient Active Problem List   Diagnosis Date Noted   Stroke (cerebrum) (HCC) 09/20/2022   Middle cerebral artery embolism, 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   Hematemesis 12/07/2017   Acute blood loss anemia 12/07/2017   Dementia (HCC) 12/07/2017   CHF (congestive heart failure) (HCC) 12/07/2017   COPD (chronic obstructive pulmonary disease) (HCC) 12/07/2017   History of completed stroke 12/07/2017   Unspecified atrial fibrillation (HCC) 12/07/2017   CKD (chronic kidney disease), stage III (HCC) 12/07/2017   History of diet-controlled diabetes 12/07/2017   Chronic bilateral low back pain without sciatica 10/02/2016   Gastroesophageal reflux disease without esophagitis 08/26/2016   GAD (generalized anxiety disorder) 07/22/2016   Depression 03/19/2016   Insomnia 03/19/2016   Coronary artery disease involving native coronary artery of native heart without angina pectoris 10/05/2014   Mixed hyperlipidemia 10/05/2014   Past Medical History:  Past Medical History:  Diagnosis Date   Anemia    Anxiety    Arthritis    "all over" (12/08/2017)   Asthma    CHF (congestive heart failure) (HCC)    Chronic lower back pain    COPD (chronic obstructive pulmonary disease) (HCC)    Dementia (HCC)    Depression    Diabetes mellitus without complication (HCC)    Fibromyalgia    Gastric ulcer    GERD (gastroesophageal reflux disease)    Guillain Barr syndrome (HCC)    Hematemesis 12/07/2017   History of blood transfusion    "low blood" (12/08/2017)   History of stomach ulcers    Hypercholesterolemia    Hypertension    Migraine    "used to have them; don't have them anymore" (12/08/2017)   Pneumonia    "probably twice" (12/08/2017)   Sleep apnea    "couldn't take the mask" (12/08/2017)   Stroke  (HCC)    "I don't remember if I have had one or not; I believe I have" (12/08/2017)   Past Surgical History:  Past Surgical History:  Procedure Laterality Date   ABDOMINAL HERNIA REPAIR     ABDOMINAL HYSTERECTOMY     APPENDECTOMY     BIOPSY  12/08/2017   Procedure: BIOPSY;  Surgeon: Lynann Bologna, MD;  Location: [facility] ENDOSCOPY;  Service: Endoscopy;;   BREAST SURGERY Left    "tumors removed; not cancer"   BUNIONECTOMY Bilateral    CARPAL TUNNEL RELEASE Bilateral    CATARACT EXTRACTION, BILATERAL Bilateral    CHOLECYSTECTOMY     COLONOSCOPY  03/03/2011   Mild sigmoid diverticulosis. Internal hemorrhoids.    DILATION AND CURETTAGE OF UTERUS     ELBOW FRACTURE SURGERY Left    ESOPHAGOGASTRODUODENOSCOPY N/A 12/08/2017   Procedure: ESOPHAGOGASTRODUODENOSCOPY (EGD);  Surgeon: Lynann Bologna, MD;  Location: [facility] ENDOSCOPY;  Service: Endoscopy;  Laterality: N/A;   ESOPHAGOGASTRODUODENOSCOPY ENDOSCOPY  12/08/2017   w/bx   FRACTURE SURGERY     HERNIA REPAIR     HIP FRACTURE SURGERY Left    KNEE ARTHROSCOPY Right    SHOULDER OPEN ROTATOR CUFF REPAIR Bilateral    TONSILLECTOMY     HPI:  Patient is a 77 y.o. female with extensive PMH including: anemia, anxiety, CHF, COPD, dementia, depression, GERD, fibromyalgia, HTN, migraine, CVA, sleep apnea. She presented to [facility] on 09/20/22 with acute onset of right hemiplegia and aphasia.  CT head revealed focal hypodensity with ASPECTS  of 8 and no hemorrhage. CTA revealed a left M1 occlusion. She was emergently transferred to [facility] for mechanical thrombectomy after getting consent from patient's son. MRI brain not completed at time of this SLP evaluation.   Assessment / Plan / Recommendation Clinical Impression  Patient is presenting with a mild mixed expressive-receptive aphasia and mild-moderate cognitive impairment (unsure what baseline cognition is, given her h/o dementia but no family present in room).She would perseverate on previous  response when answering questions and did not exhibit awareness. (for example, states year as "2024" and then when asked month, states, "I dont know.....24?" She was 75% accurate with confrontational naming. She required cues for setup and assistance with meal tray as well as cues to scan/look at tray for items she was wanting to eat. She was only able to recall that "my nurse" had been in room today. She is not aware of why she is here in the hospital but did tell SLP, "I couldnt get out of bed. I peed all over myself". She also reported that she lives at home with her son and her brother recently passed away in April/March from a drug overdose. Per patient's report, her son cooks and cleans and does help her get dressed "sometimes". SLP recommending ongoing skilled intervention and to help determine need for skilled SLP services at next venue of care.    SLP Assessment  SLP Recommendation/Assessment: Patient needs continued Speech Lanaguage Pathology Services SLP Visit Diagnosis: Cognitive communication deficit (R41.841);Aphasia (R47.01)    Recommendations for follow up therapy are one component of a multi-disciplinary discharge planning process, led by the attending physician.  Recommendations may be updated based on patient status, additional functional criteria and insurance authorization.    Follow Up Recommendations  Other (comment) (SLP at next venue of care)    Assistance Recommended at Discharge  Frequent or constant Supervision/Assistance  Functional Status Assessment Patient has had a recent decline in their functional status and demonstrates the ability to make significant improvements in function in a reasonable and predictable amount of time.  Frequency and Duration min 1 x/week  1 week      SLP Evaluation Cognition  Overall Cognitive Status: No family/caregiver present to determine baseline cognitive functioning Arousal/Alertness: Awake/alert Orientation Level: Oriented to  person Year: 2024 Day of Week: Incorrect Attention: Sustained Sustained Attention: Impaired Sustained Attention Impairment: Verbal basic;Functional basic Memory: Impaired Memory Impairment: Storage deficit;Retrieval deficit Awareness: Impaired Awareness Impairment: Emergent impairment Problem Solving: Impaired Problem Solving Impairment: Functional basic Behaviors: Perseveration Safety/Judgment: Impaired       Comprehension  Auditory Comprehension Overall Auditory Comprehension: Impaired Conversation: Simple Interfering Components: Attention;Processing speed;Working Radio broadcast assistant: Dietitian: Not tested Reading Comprehension Reading Status: Not tested    Expression Expression Primary Mode of Expression: Verbal Verbal Expression Overall Verbal Expression: Impaired Initiation: No impairment Naming: Impairment Responsive: Not tested Confrontation: Impaired Convergent: Not tested Divergent: Not tested Verbal Errors: Perseveration;Not aware of errors Pragmatics: Impairment Impairments: Abnormal affect Interfering Components: Attention Effective Techniques: Open ended questions Non-Verbal Means of Communication: Not applicable   Oral / Motor  Oral Motor/Sensory Function Overall Oral Motor/Sensory Function: Mild impairment Facial ROM: Reduced right Facial Strength: Reduced right Motor Speech Overall Motor Speech: Appears within functional limits for tasks assessed Respiration: Within functional limits Phonation: Normal Resonance: Within functional limits Articulation: Within functional limitis Intelligibility: Intelligible Motor Planning: Witnin functional limits Motor Speech Errors: Not applicable           Angela Nevin, MA,  CCC-SLP Speech Therapy

## 2022-09-21 NOTE — Progress Notes (Signed)
Rounding Note    Patient Name: Melissa [name] Date of Encounter: 09/21/2022  [facility] Cardiologist: None   Subjective   Extubated this morning. Denies any chest pain or dyspnea. Oriented x2.  Inpatient Medications    Scheduled Meds:  aspirin  81 mg Oral Daily   Or   aspirin  81 mg Per Tube Daily   Chlorhexidine Gluconate Cloth  6 each Topical Daily   docusate  100 mg Per Tube BID   insulin aspart  0-15 Units Subcutaneous Q4H   mouth rinse  15 mL Mouth Rinse Q2H   pantoprazole (PROTONIX) IV  40 mg Intravenous Daily   polyethylene glycol  17 g Per Tube Daily   rosuvastatin  40 mg Per Tube Daily   ticagrelor  90 mg Oral BID   Or   ticagrelor  90 mg Per Tube BID   Continuous Infusions:  sodium chloride 75 mL/hr at 09/21/22 0900   PRN Meds: acetaminophen **OR** acetaminophen (TYLENOL) oral liquid 160 mg/5 mL **OR** acetaminophen, albuterol, fentaNYL (SUBLIMAZE) injection, hydrALAZINE, midazolam, mouth rinse, senna-docusate   Vital Signs    Vitals:   09/21/22 0649 09/21/22 0700 09/21/22 0800 09/21/22 0900  BP:  (!) 151/59 (!) 152/109 (!) 140/62  Pulse: 66 (!) 54 72 65  Resp: (!) 21 (!) 24 16 18   Temp:   98.5 F (36.9 C)   TempSrc:   Axillary   SpO2: 100% 99% 99% 98%  Weight:      Height:        Intake/Output Summary (Last 24 hours) at 09/21/2022 1105 Last data filed at 09/21/2022 0900 Gross per 24 hour  Intake 2387.79 ml  Output 900 ml  Net 1487.79 ml      09/20/2022    4:52 AM 12/09/2017    6:52 AM 12/07/2017    3:58 AM  Last 3 Weights  Weight (lbs) 162 lb 14.7 oz 138 lb 10.7 oz 140 lb  Weight (kg) 73.9 kg 62.9 kg 63.504 kg      Telemetry    Afib, rates as low as 30s overnight, currently improved in 50-60s - Personally Reviewed  ECG    No new EKG - Personally Reviewed  Physical Exam   GEN: No acute distress.   Neck: No JVD Cardiac: irregular, bradycardic, no murmurs, rubs, or gallops.  Respiratory: Clear to auscultation  bilaterally. GI: Soft, nontender, non-distended  MS: No edema; No deformity. Neuro:  Oriented x2 Psych: Normal affect   Labs    High Sensitivity Troponin:  No results for input(s): "TROPONINIHS" in the last 720 hours.   Chemistry Recent Labs  Lab 09/20/22 0218 09/20/22 0504 09/20/22 0514 09/20/22 0803 09/20/22 1043 09/20/22 1220 09/21/22 0309  NA 137 135   < > 140  --  139 135  K 4.1 3.4*   < > 3.1*  --  3.9 4.6  CL 104 103  --   --   --   --  107  CO2  --  21*  --   --   --   --  19*  GLUCOSE 134* 170*  --   --   --   --  122*  BUN 9 5*  --   --   --   --  14  CREATININE 0.90 0.95  --   --   --   --  1.02*  CALCIUM  --  8.8*  --   --   --   --  8.8*  MG  --   --   --   --  1.8  --  2.1  PROT  --  6.5  --   --   --   --   --   ALBUMIN  --  3.5  --   --   --   --   --   AST  --  18  --   --   --   --   --   ALT  --  15  --   --   --   --   --   ALKPHOS  --  97  --   --   --   --   --   BILITOT  --  0.8  --   --   --   --   --   GFRNONAA  --  >60  --   --   --   --  56*  ANIONGAP  --  11  --   --   --   --  9   < > = values in this interval not displayed.    Lipids  Recent Labs  Lab 09/20/22 0504 09/21/22 0309  CHOL 192  --   TRIG 92 86  HDL 54  --   LDLCALC 120*  --   CHOLHDL 3.6  --     Hematology Recent Labs  Lab 09/20/22 0504 09/20/22 0514 09/20/22 0803 09/20/22 1220 09/21/22 0309  WBC 9.7  --   --   --  7.8  RBC 4.23  --   --   --  3.70*  HGB 10.0*   < > 10.9* 10.2* 8.7*  HCT 32.6*   < > 32.0* 30.0* 28.5*  MCV 77.1*  --   --   --  77.0*  MCH 23.6*  --   --   --  23.5*  MCHC 30.7  --   --   --  30.5  RDW 25.2*  --   --   --  26.0*  PLT 330  --   --   --  290   < > = values in this interval not displayed.   Thyroid No results for input(s): "TSH", "FREET4" in the last 168 hours.  BNPNo results for input(s): "BNP", "PROBNP" in the last 168 hours.  DDimer No results for input(s): "DDIMER" in the last 168 hours.   Radiology    ECHOCARDIOGRAM  COMPLETE  Result Date: 09/20/2022    ECHOCARDIOGRAM REPORT   Patient Name:   Melissa [name] Date of Exam: 09/20/2022 Medical Rec #:  [id number]        Height:       62.0 in Accession #:    [id number]       Weight:       162.9 lb Date of Birth:  May 28, 1945        BSA:          1.752 m Patient Age:    77 years         BP:           118/50 mmHg Patient Gender: F                HR:           53 bpm. Exam Location:  Inpatient Procedure: 2D Echo, 3D Echo, Cardiac Doppler and Color Doppler Indications:    Stroke  History:        Patient has prior history  of Echocardiogram examinations, most                 recent 12/08/2017. CAD, Abnormal ECG, Stroke and COPD,                 Arrythmias:Atrial Fibrillation, Signs/Symptoms:Altered Mental                 Status and Alzheimer's; Risk Factors:Dyslipidemia. Pericardial                 effusion.  Sonographer:    Sheralyn Boatman RDCS Referring Phys: [id number] ERIC LINDZEN  Sonographer Comments: Suboptimal apical window. IMPRESSIONS  1. Left ventricular ejection fraction, by estimation, is 60 to 65%. Left ventricular ejection fraction by 3D volume is 64 %. The left ventricle has normal function. The left ventricle has no regional wall motion abnormalities. Left ventricular diastolic  parameters are indeterminate.  2. Right ventricular systolic function is normal. The right ventricular size is normal. Tricuspid regurgitation signal is inadequate for assessing PA pressure.  3. The mitral valve is normal in structure. No evidence of mitral valve regurgitation. No evidence of mitral stenosis.  4. The aortic valve is tricuspid. Aortic valve regurgitation is not visualized. No aortic stenosis is present. FINDINGS  Left Ventricle: Left ventricular ejection fraction, by estimation, is 60 to 65%. Left ventricular ejection fraction by 3D volume is 64 %. The left ventricle has normal function. The left ventricle has no regional wall motion abnormalities. The left ventricular internal cavity size was  normal in size. There is no left ventricular hypertrophy. Left ventricular diastolic parameters are indeterminate. Right Ventricle: The right ventricular size is normal. No increase in right ventricular wall thickness. Right ventricular systolic function is normal. Tricuspid regurgitation signal is inadequate for assessing PA pressure. Left Atrium: Left atrial size was normal in size. Right Atrium: Right atrial size was normal in size. Pericardium: Trivial pericardial effusion is present. Mitral Valve: The mitral valve is normal in structure. No evidence of mitral valve regurgitation. No evidence of mitral valve stenosis. Tricuspid Valve: The tricuspid valve is normal in structure. Tricuspid valve regurgitation is trivial. Aortic Valve: The aortic valve is tricuspid. Aortic valve regurgitation is not visualized. No aortic stenosis is present. Pulmonic Valve: The pulmonic valve was not well visualized. Pulmonic valve regurgitation is not visualized. Aorta: The aortic root and ascending aorta are structurally normal, with no evidence of dilitation. IAS/Shunts: The interatrial septum was not well visualized.  LEFT VENTRICLE PLAX 2D LVIDd:         4.70 cm LVIDs:         2.70 cm LV PW:         1.10 cm         3D Volume EF LV IVS:        0.80 cm         LV 3D EF:    Left LVOT diam:     2.10 cm                      ventricul LV SV:         83                           ar LV SV Index:   47                           ejection LVOT Area:  3.46 cm                     fraction                                             by 3D                                             volume is                                             64 %.                                 3D Volume EF:                                3D EF:        64 %                                LV EDV:       108 ml                                LV ESV:       38 ml                                LV SV:        70 ml RIGHT VENTRICLE             IVC RV S prime:      10.10 cm/s  IVC diam: 2.40 cm TAPSE (M-mode): 1.1 cm LEFT ATRIUM             Index        RIGHT ATRIUM           Index LA diam:        4.40 cm 2.51 cm/m   RA Area:     15.40 cm LA Vol (A2C):   46.2 ml 26.37 ml/m  RA Volume:   39.10 ml  22.32 ml/m LA Vol (A4C):   39.2 ml 22.37 ml/m LA Biplane Vol: 45.0 ml 25.68 ml/m  AORTIC VALVE LVOT Vmax:   116.00 cm/s LVOT Vmean:  75.200 cm/s LVOT VTI:    0.240 m  AORTA Ao Root diam: 2.90 cm Ao Asc diam:  3.20 cm MITRAL VALVE MV Area (PHT): 2.26 cm     SHUNTS MV Decel Time: 335 msec     Systemic VTI:  0.24 m MV E velocity: 109.00 cm/s  Systemic Diam: 2.10 cm Epifanio Lesches MD Electronically signed by Epifanio Lesches MD Signature Date/Time: 09/20/2022/3:39:40 PM    Final    DG Abd 1 View  Result Date: 09/20/2022 CLINICAL DATA:  Confirm orogastric tube placement. EXAM: ABDOMEN - 1 VIEW COMPARISON:  12/17/2017 and CT of the abdomen and pelvis on 12/18/2017 FINDINGS: Enteric tube has been placed, tip overlying the level of the proximal stomach. Side port is in the region of the gastroesophageal junction. Course of the tube in the thorax is consistent with hiatal hernia. Visualized bowel gas pattern is nonobstructive. IMPRESSION: Enteric tube tip overlies the level of the proximal stomach. Electronically Signed   By: Norva Pavlov M.D.   On: 09/20/2022 09:37   CT HEAD WO CONTRAST ( )  Result Date: 09/20/2022 CLINICAL DATA:  77 year old female code stroke presentation yesterday at [facility] left MCA M1 ELVO. Status post endovascular revascularization. EXAM: CT HEAD WITHOUT CONTRAST TECHNIQUE: Contiguous axial images were obtained from the base of the skull through the vertex without intravenous contrast. RADIATION DOSE REDUCTION: This exam was performed according to the departmental dose-optimization program which includes automated exposure control, adjustment of the mA and/or kV according to patient size and/or use of iterative reconstruction  technique. COMPARISON:  Code stroke presentation head CT yesterday. FINDINGS: Brain: Residual intravascular contrast. Chronic left basal ganglia lacunar infarct. Gray-white differentiation in both hemispheres appears stable since yesterday. No definite acute or evolving left MCA infarct by CT. No acute intracranial hemorrhage identified. No midline shift, mass effect, or evidence of intracranial mass lesion. No ventriculomegaly. Vascular: New left MCA M1 segment vascular stent. Calcified atherosclerosis at the skull base. Skull: No acute osseous abnormality identified. Sinuses/Orbits: Intubated now. Visualized paranasal sinuses and mastoids are stable and well aerated. Other: No acute orbit or scalp soft tissue finding. IMPRESSION: 1. New Left MCA vascular stent and some residual intravascular contrast. 2. No definite acute or evolving infarct by CT. No acute intracranial hemorrhage or mass effect. 3. Chronic left basal ganglia lacunar infarct. Electronically Signed   By: Odessa Fleming M.D.   On: 09/20/2022 08:31   DG Chest Port 1 View  Result Date: 09/20/2022 CLINICAL DATA:  [id number], [id number].  Check NGT, ETT. EXAM: PORTABLE CHEST 1 VIEW COMPARISON:  CTA chest, abdomen and pelvis 01/27/2022, chest earlier today at 12:33 a.m. FINDINGS: 6:47 a.m. ETT interval insertion with the tip 3 cm from the carina. NGT is within a sizable hiatal hernia. Based on the prior CTA chest the sidehole is in the distal thoracic esophagus and should be advanced further in about 10 cm. On that study, just over 1/2 of the stomach was intrathoracic and inverted. The lungs are clear of infiltrates.  No pleural effusion is seen. There is stable moderate to severe cardiomegaly and central vascular fullness without evidence of acute CHF. The aortic arch is heavily calcified.  The mediastinum is stable. Osteopenia and degenerative change thoracic spine. IMPRESSION: 1. ETT interval insertion with the tip 3 cm from the carina. 2. NGT is within a  sizable hiatal hernia. Based on the prior CTA chest the sidehole is in the distal thoracic esophagus and should be advanced further in about 10 cm. 3. No acute chest findings.  Stable cardiomegaly. Electronically Signed   By: Almira Bar M.D.   On: 09/20/2022 07:35    Cardiac Studies   Echo 09/20/22:  1. Left ventricular ejection fraction, by estimation, is 60 to 65%. Left  ventricular ejection fraction by 3D volume is 64 %. The left ventricle has  normal function. The left ventricle has no regional wall motion  abnormalities. Left ventricular diastolic   parameters are indeterminate.   2. Right ventricular systolic function is normal. The right ventricular  size is normal. Tricuspid regurgitation signal is inadequate for  assessing  PA pressure.   3. The mitral valve is normal in structure. No evidence of mitral valve  regurgitation. No evidence of mitral stenosis.   4. The aortic valve is tricuspid. Aortic valve regurgitation is not  visualized. No aortic stenosis is present.   Patient Profile     77 y.o. female with a hx of CHF, CVA, atrial fibrillation, dementia, hypertension   Assessment & Plan    Chronic atrial fibrillation: Has not been on anticoagulation as outpatient due to recurrent GI bleeding.  Echo this admission shows normal biventricular function, no significant valvular disease -Anticoagulation currently on hold in setting of acute CVA, defer to neurology on timing for starting anticoagulation in setting of acute CVA.  Given GI bleed history, would consider outpatient referral for Watchman evaluation pending clinical course as she recovers from CVA, though unclear if would be candidate given her comorbidities including dementia -Rates well controlled despite no AV nodal blockers.  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  S/p M1  thrombectomy.  Stent was placed, started on Brilinta, ASA.  For questions or updates, please contact [facility] Please consult [email or web address] for contact info under        Signed, Little Ishikawa, MD  09/21/2022, 11:05 AM

## 2022-09-21 NOTE — Evaluation (Signed)
Physical Therapy Evaluation Patient Details Name: Melissa [name] MRN: [id number] DOB: 1945-06-27 Today's Date: 09/21/2022  History of Present Illness  Pt is 77 yo female admitted on 09/20/22 from [facility] ER with AMS, slurred speech, R weak and L gaze. Pt with L MCA s/p mechanical thrombectomy on  09/20/22.  Pt with hx including Anemia, Anxiety, OA, Asthma, CHF, COPD, Back pain, Dementia, Depression, DM type 2, Fibromyalgia, PUD, GERD, GBS, HLD, HTN, Migraine headache, Pneumonia, OSA, CVA (reports have effected L side), IDA, CAD, A fib  Clinical Impression  Pt admitted with above diagnosis. At baseline, pt resides with son who provides 24 hr supervision. She is normally ambulatory with cane.  Pt does have some L sided weakness from prior CVA.  She was admitted with R sided weakness but had no c/o R weakness today after thrombectomy.  She demonstrated good strength on R side with intact sensation. Pt able to ambulate 100' with min guard for safety.  All VSS during session.  Pt with mild confusion during evaluation but functional - follows commands , answered PLOF appropriately, and fair safety (mainly limited by lines). Pt expected to progress well with therapy.  Pt and son do not drive and she would benefit from further therapy - likely in home setting.   Pt currently with functional limitations due to the deficits listed below (see PT Problem List). Pt will benefit from acute skilled PT to increase their independence and safety with mobility to allow discharge.          Recommendations for follow up therapy are one component of a multi-disciplinary discharge planning process, led by the attending physician.  Recommendations may be updated based on patient status, additional functional criteria and insurance authorization.  Follow Up Recommendations       Assistance Recommended at Discharge Frequent or constant Supervision/Assistance  Patient can return home with the following  A little help with  walking and/or transfers;A little help with bathing/dressing/bathroom;Assistance with cooking/housework;Help with stairs or ramp for entrance    Equipment Recommendations None recommended by PT  Recommendations for Other Services       Functional Status Assessment Patient has had a recent decline in their functional status and demonstrates the ability to make significant improvements in function in a reasonable and predictable amount of time.     Precautions / Restrictions Precautions Precautions: Fall      Mobility  Bed Mobility Overal bed mobility: Needs Assistance Bed Mobility: Supine to Sit     Supine to sit: Min assist     General bed mobility comments: light min A    Transfers Overall transfer level: Needs assistance Equipment used: Rolling walker (2 wheels) Transfers: Sit to/from Stand, Bed to chair/wheelchair/BSC Sit to Stand: Min guard, Min assist Stand pivot transfers: Min assist         General transfer comment: Initial stand for stand pivot to chair without RW with min A.  Found pt RW and performed next sit to stand with min guard    Ambulation/Gait Ambulation/Gait assistance: Min guard Gait Distance (Feet): 100 Feet Assistive device: Rolling walker (2 wheels) Gait Pattern/deviations: Step-through pattern, Decreased stride length Gait velocity: decreased     General Gait Details: Ambulated 100' in hallway with min guard , no overt LOB but mild drifting  Stairs            Wheelchair Mobility    Modified Rankin (Stroke Patients Only) Modified Rankin (Stroke Patients Only) Pre-Morbid Rankin Score: Moderate disability Modified Rankin: Moderately  severe disability     Balance Overall balance assessment: Needs assistance Sitting-balance support: No upper extremity supported Sitting balance-Leahy Scale: Good     Standing balance support: No upper extremity supported, Bilateral upper extremity supported Standing balance-Leahy Scale:  Fair Standing balance comment: Could static stand without AD; RW to ambulate                             Pertinent Vitals/Pain Pain Assessment Pain Assessment: No/denies pain    Home Living Family/patient expects to be discharged to:: Private residence Living Arrangements: Children Available Help at Discharge: Family;Available 24 hours/day Type of Home: House Home Access: Stairs to enter Entrance Stairs-Rails: Doctor, general practice of Steps: 5   Home Layout: One level Home Equipment: Agricultural consultant (2 wheels);Gilmer Mor - single point Additional Comments: Lives with son who has epiliepsy but can provide 24 hr physical support.  Neither drive.    Prior Function Prior Level of Function : Needs assist             Mobility Comments: Ambulates with cane - could ambulate in community ADLs Comments: Pt performs ADLs; so does IADLs; neither drive     Hand Dominance        Extremity/Trunk Assessment   Upper Extremity Assessment Upper Extremity Assessment: LUE deficits/detail;RUE deficits/detail RUE Deficits / Details: ROM: Shoulder elevation mildly limited but functional, hand and wrist WFL; MMT: shoulder 4-/5, elbow 5/5, hand 5/5 RUE Sensation: WNL LUE Deficits / Details: Reports prior deficits from CVA; ROM: Shoulder elevation limited but functional, wrist WFL, hand WFL except 4th digit trigger finger; MMT: shoulder 2/5, elbow 4/5, hand 4/5 LUE Sensation: decreased light touch (from prior CVA)    Lower Extremity Assessment Lower Extremity Assessment: LLE deficits/detail;RLE deficits/detail RLE Deficits / Details: ROM WFL; MMT 5/5 RLE Sensation: WNL LLE Deficits / Details: ROM WFL; MMT 5/5 LLE Sensation: WNL    Cervical / Trunk Assessment Cervical / Trunk Assessment: Kyphotic  Communication   Communication: No difficulties  Cognition Arousal/Alertness: Awake/alert Behavior During Therapy: WFL for tasks assessed/performed Overall Cognitive Status:  No family/caregiver present to determine baseline cognitive functioning                                 General Comments: Overall functional.  Some mild confusion (unaware of R groin site for thrombectomy, using call bell for phone, increased time providing PLOF but accurate).  Followed commands        General Comments General comments (skin integrity, edema, etc.): VSS; BP 150's/60's during evaluation; HR 60s-80's; O2: pt on 2 L at arrival with sats 98%.  Tried RA and maintained >92%.  Did place back on 2 L but notified RN    Exercises     Assessment/Plan    PT Assessment Patient needs continued PT services  PT Problem List Decreased strength;Decreased mobility;Decreased safety awareness;Decreased range of motion;Decreased activity tolerance;Cardiopulmonary status limiting activity;Decreased balance;Decreased cognition;Decreased knowledge of use of DME       PT Treatment Interventions DME instruction;Therapeutic activities;Modalities;Gait training;Therapeutic exercise;Patient/family education;Stair training;Functional mobility training;Neuromuscular re-education;Balance training    PT Goals (Current goals can be found in the Care Plan section)  Acute Rehab PT Goals Patient Stated Goal: return home PT Goal Formulation: With patient/family Time For Goal Achievement: 10/05/22 Potential to Achieve Goals: Good    Frequency Min 4X/week     Co-evaluation  AM-PAC PT "6 Clicks" Mobility  Outcome Measure Help needed turning from your back to your side while in a flat bed without using bedrails?: A Little Help needed moving from lying on your back to sitting on the side of a flat bed without using bedrails?: A Little Help needed moving to and from a bed to a chair (including a wheelchair)?: A Little Help needed standing up from a chair using your arms (e.g., wheelchair or bedside chair)?: A Little Help needed to walk in hospital room?: A Little Help  needed climbing 3-5 steps with a railing? : A Little 6 Click Score: 18    End of Session Equipment Utilized During Treatment: Gait belt Activity Tolerance: Patient tolerated treatment well Patient left: with chair alarm set;in chair;with call bell/phone within reach Nurse Communication: Mobility status PT Visit Diagnosis: Other abnormalities of gait and mobility (R26.89);Muscle weakness (generalized) (M62.81)    Time: 1610-9604 PT Time Calculation (min) (ACUTE ONLY): 37 min   Charges:   PT Evaluation $PT Eval Low Complexity: 1 Low PT Treatments $Gait Training: 8-22 mins        Anise Salvo, PT Acute Rehab [facility] [phone]   Rayetta Humphrey 09/21/2022, 5:18 PM

## 2022-09-21 NOTE — Progress Notes (Signed)
Pt extubated per Dr's written order. Positive cuff leak was heard prior to extubation, pt is on 2L [place] and tolerating well at this time.

## 2022-09-21 NOTE — Progress Notes (Signed)
Pt heart rate has been labile for the duration of this shift and during dayshift previously- MD aware (per dayshift RN). Heart rate began sustaining in 40s more consistently around 0100. Neuro exam and BP remains stable at this time. eLink made aware.

## 2022-09-21 NOTE — Progress Notes (Signed)
VASCULAR LAB    Ultrasound of the right groin has been performed.  See CV proc for preliminary results.   Caleel Kiner, RVT 09/21/2022, 1:58 PM

## 2022-09-21 NOTE — Progress Notes (Signed)
STROKE TEAM PROGRESS NOTE   INTERVAL HISTORY No family at the bedside.  RN at the bedside  Extubated this morning. Art line, foley is out.   Swallow study pending.    Vitals:   09/21/22 0649 09/21/22 0700 09/21/22 0800 09/21/22 0900  BP:  (!) 151/59 (!) 152/109 (!) 140/62  Pulse: 66 (!) 54 72 65  Resp: (!) 21 (!) 24 16 18   Temp:   98.5 F (36.9 C)   TempSrc:   Axillary   SpO2: 100% 99% 99% 98%  Weight:      Height:       CBC:  Recent Labs  Lab 09/20/22 0504 09/20/22 0514 09/20/22 1220 09/21/22 0309  WBC 9.7  --   --  7.8  NEUTROABS 8.8*  --   --   --   HGB 10.0*   < > 10.2* 8.7*  HCT 32.6*   < > 30.0* 28.5*  MCV 77.1*  --   --  77.0*  PLT 330  --   --  290   < > = values in this interval not displayed.    Basic Metabolic Panel:  Recent Labs  Lab 09/20/22 0504 09/20/22 0514 09/20/22 1043 09/20/22 1220 09/21/22 0309  NA 135   < >  --  139 135  K 3.4*   < >  --  3.9 4.6  CL 103  --   --   --  107  CO2 21*  --   --   --  19*  GLUCOSE 170*  --   --   --  122*  BUN 5*  --   --   --  14  CREATININE 0.95  --   --   --  1.02*  CALCIUM 8.8*  --   --   --  8.8*  MG  --   --  1.8  --  2.1  PHOS  --   --  3.6  --  4.4   < > = values in this interval not displayed.    Lipid Panel:  Recent Labs  Lab 09/20/22 0504 09/21/22 0309  CHOL 192  --   TRIG 92 86  HDL 54  --   CHOLHDL 3.6  --   VLDL 18  --   LDLCALC 120*  --     HgbA1c:  Recent Labs  Lab 09/21/22 0309  HGBA1C 5.5   Urine Drug Screen: No results for input(s): "LABOPIA", "COCAINSCRNUR", "LABBENZ", "AMPHETMU", "THCU", "LABBARB" in the last 168 hours.  Alcohol Level No results for input(s): "ETH" in the last 168 hours.  IMAGING past 24 hours ECHOCARDIOGRAM COMPLETE  Result Date: 09/20/2022    ECHOCARDIOGRAM REPORT   Patient Name:   Melissa [name] Date of Exam: 09/20/2022 Medical Rec #:  [id number]        Height:       62.0 in Accession #:    [id number]       Weight:       162.9 lb Date of Birth:   [birth year]        BSA:          1.752 m Patient Age:    77 years         BP:           118/50 mmHg Patient Gender: F                HR:           53 bpm.  Exam Location:  Inpatient Procedure: 2D Echo, 3D Echo, Cardiac Doppler and Color Doppler Indications:    Stroke  History:        Patient has prior history of Echocardiogram examinations, most                 recent 12/08/2017. CAD, Abnormal ECG, Stroke and COPD,                 Arrythmias:Atrial Fibrillation, Signs/Symptoms:Altered Mental                 Status and Alzheimer's; Risk Factors:Dyslipidemia. Pericardial                 effusion.  Sonographer:    Sheralyn Boatman RDCS Referring Phys: [id number] ERIC LINDZEN  Sonographer Comments: Suboptimal apical window. IMPRESSIONS  1. Left ventricular ejection fraction, by estimation, is 60 to 65%. Left ventricular ejection fraction by 3D volume is 64 %. The left ventricle has normal function. The left ventricle has no regional wall motion abnormalities. Left ventricular diastolic  parameters are indeterminate.  2. Right ventricular systolic function is normal. The right ventricular size is normal. Tricuspid regurgitation signal is inadequate for assessing PA pressure.  3. The mitral valve is normal in structure. No evidence of mitral valve regurgitation. No evidence of mitral stenosis.  4. The aortic valve is tricuspid. Aortic valve regurgitation is not visualized. No aortic stenosis is present. FINDINGS  Left Ventricle: Left ventricular ejection fraction, by estimation, is 60 to 65%. Left ventricular ejection fraction by 3D volume is 64 %. The left ventricle has normal function. The left ventricle has no regional wall motion abnormalities. The left ventricular internal cavity size was normal in size. There is no left ventricular hypertrophy. Left ventricular diastolic parameters are indeterminate. Right Ventricle: The right ventricular size is normal. No increase in right ventricular wall thickness. Right ventricular systolic  function is normal. Tricuspid regurgitation signal is inadequate for assessing PA pressure. Left Atrium: Left atrial size was normal in size. Right Atrium: Right atrial size was normal in size. Pericardium: Trivial pericardial effusion is present. Mitral Valve: The mitral valve is normal in structure. No evidence of mitral valve regurgitation. No evidence of mitral valve stenosis. Tricuspid Valve: The tricuspid valve is normal in structure. Tricuspid valve regurgitation is trivial. Aortic Valve: The aortic valve is tricuspid. Aortic valve regurgitation is not visualized. No aortic stenosis is present. Pulmonic Valve: The pulmonic valve was not well visualized. Pulmonic valve regurgitation is not visualized. Aorta: The aortic root and ascending aorta are structurally normal, with no evidence of dilitation. IAS/Shunts: The interatrial septum was not well visualized.  LEFT VENTRICLE PLAX 2D LVIDd:         4.70 cm LVIDs:         2.70 cm LV PW:         1.10 cm         3D Volume EF LV IVS:        0.80 cm         LV 3D EF:    Left LVOT diam:     2.10 cm                      ventricul LV SV:         83                           ar LV SV Index:  47                           ejection LVOT Area:     3.46 cm                     fraction                                             by 3D                                             volume is                                             64 %.                                 3D Volume EF:                                3D EF:        64 %                                LV EDV:       108 ml                                LV ESV:       38 ml                                LV SV:        70 ml RIGHT VENTRICLE             IVC RV S prime:     10.10 cm/s  IVC diam: 2.40 cm TAPSE (M-mode): 1.1 cm LEFT ATRIUM             Index        RIGHT ATRIUM           Index LA diam:        4.40 cm 2.51 cm/m   RA Area:     15.40 cm LA Vol (A2C):   46.2 ml 26.37 ml/m  RA Volume:   39.10 ml  22.32 ml/m  LA Vol (A4C):   39.2 ml 22.37 ml/m LA Biplane Vol: 45.0 ml 25.68 ml/m  AORTIC VALVE LVOT Vmax:   116.00 cm/s LVOT Vmean:  75.200 cm/s LVOT VTI:    0.240 m  AORTA Ao Root diam: 2.90 cm Ao Asc diam:  3.20 cm MITRAL VALVE MV Area (PHT): 2.26 cm     SHUNTS MV Decel Time: 335 msec     Systemic VTI:  0.24 m MV E velocity: 109.00 cm/s  Systemic Diam: 2.10 cm Epifanio Lesches MD Electronically signed by Cristal Deer  Bjorn Pippin MD Signature Date/Time: 09/20/2022/3:39:40 PM    Final     PHYSICAL EXAM  Temp:  [97.9 F (36.6 C)-98.5 F (36.9 C)] 98.5 F (36.9 C) (05/19 0800) Pulse Rate:  [39-72] 65 (05/19 0900) Resp:  [16-24] 18 (05/19 0900) BP: (107-178)/(50-109) 140/62 (05/19 0900) SpO2:  [95 %-100 %] 98 % (05/19 0900) Arterial Line BP: (114-172)/(45-70) 172/70 (05/19 0800) FiO2 (%):  [40 %] 40 % (05/19 0730)  General -NAD Cardiovascular -A-fib on monitor  Mental Status -  Awake, alert. Oriented to name, place but not year/month. Pupils equal round reactive.  Slight right facial droop. No dysarthria. No aphasia. No visual field cut. Follows commands. Tongue midline. Right arm 4/5 with drift. Left arm and b/l legs no drift.  Sensory: intact to LT.  No ataxia.  Gait not examined.   ASSESSMENT/PLAN Melissa [name] is a 77 y.o. female with history of A-fib not on anticoagulation, hypertension, hyperlipidemia, CHF, diabetes, dementia, recurrent GI bleed, anxiety and depression, COPD, history of Guillain-Barr, migraine history, sleep apnea not on CPAP, prior stroke history, GERD, fibromyalgia who initially presented to [facility] with acute onset of right hemiplegia and aphasia. LKN was 8:00 PM on Friday night. Family heard a thumping sound at 11 PM and went to the room she was in, where apparently she had just fallen. Above deficits were noted and EMS was called. No head trauma was reported. Teleneurology evaluated the patient there and she was determined not to be a TNK candidate  due to time criteria. NIHSS was 20. CT head revealed focal hypodensity with ASPECTS of 8 and no hemorrhage. CTA revealed a left M1 occlusion. She was emergently transferred to [facility] for mechanical thrombectomy,   5/19: extubated. 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  Aspects of 8 CTA head & neck at outside hospital revealed a left M1 occlusion Cerebral angio s/p complete revascularization of left M1 1 with TICI 3, s/p placement of rescue stent due to reocclusion secondary to underlying intracranial atherosclerosis with return to TICI 3 Post IR CT no hemorrhage Repeat CT head with no acute infarct MRI ordered 2D Echo EF 60-65%. LDL 120 HgbA1c 5.5 VTE prophylaxis -SCDs    Diet   Diet NPO time specified   No antithrombotics prior to admission, now on on aspirin 81 mg daily and Brilinta 90 mg twice daily Therapy recommendations: Pending Disposition: Pending  Dementia Anxiety and depression Fibromyalgia Noted   Hypertension Chronic A-fib on no anticoagulation CAD Chronic diastolic CHF Home meds: Imdur 30 mg BP goal 1 20-1 40 for 24 hours after thrombectomy Long-term BP goal normotensive cardiology recommends watchman device.  Hyperlipidemia Home meds: None LDL 120, goal < 70 Con't Crestor 40 mg Continue statin at discharge   COPD Asthma   Diabetes type II Controlled Home meds: None HgbA1c ordered., goal < 7.0 CBGs Recent Labs    09/20/22 2332 09/21/22 0320 09/21/22 0818  GLUCAP 112* 124* 115*     SSI  Dysphagia -bed side swallow History of GI bleed secondary to PUD Hiatal hernia GERD OG tube placed Consider starting tube feeds in the next day or so Speech therapy to follow Nutrition consult  for tube feeds Protonix  Other Stroke Risk Factors Advanced Age >/= 27  Hx stroke/TIA CAD Migraines Obstructive sleep apnea, not on CPAP at home Chronic CHF  Other Active Problems Chronic anemia-Hgb 10.  Will monitor Hypokalemia-K3.4.  Will replace and check in the morning  Hospital day # 1  Extubated today. Swallow screen. MRI today. Apprecaite Cardiology consult, recommend watchman device. Transfer out of ICU today. Con't brillinta and aspirin. No AC due to h/o GI bleed.  Barry Faircloth,MD   To contact Stroke Continuity provider, please refer to [email or web address]. After hours, contact General Neurology

## 2022-09-21 NOTE — Progress Notes (Signed)
Referring Physician(s): Stroke team, Caryl Pina   Supervising Physician: Julieanne Cotton  Patient Status:  [name] - In-pt  Chief Complaint:  Acute onset of right hemiplegia and aphasia, Lt MCA occlusion  S/p Lt MCA thrombectomy and stent placement by Dr. Corliss Skains on 5/18  Subjective:  Patient sitting in bed, NAD, she was extubated.  Patient is a/o to self and able to answer questions appropriately, follows commands.  She is cheerful, states no complaints.    Allergies: Nsaids, Codeine, Lyrica [pregabalin], and Penicillins  Medications: Prior to Admission medications   Medication Sig Start Date End Date Taking? Authorizing Provider  isosorbide mononitrate (IMDUR) 30 MG 24 hr tablet Take 1 tablet by mouth daily. 12/15/17  Yes [provider]  omeprazole (PRILOSEC) 40 MG capsule Take 40 mg by mouth daily. 07/07/22  Yes [provider]  ferrous sulfate 325 (65 FE) MG tablet Take 1 tablet (325 mg total) by mouth daily. 12/09/17 12/09/18  Tyrone Nine, MD  pantoprazole (PROTONIX) 40 MG tablet Take 1 tablet (40 mg total) by mouth 2 (two) times daily. 12/09/17 12/09/18  Tyrone Nine, MD     Vital Signs: BP (!) 140/62   Pulse 65   Temp 98.5 F (36.9 C) (Axillary)   Resp 18   Ht 5\' 2"  (1.575 m)   Wt 162 lb 14.7 oz (73.9 kg)   SpO2 98%   BMI 29.80 kg/m   Physical Exam Vitals reviewed.  Constitutional:      General: She is not in acute distress.    Appearance: She is not ill-appearing.  HENT:     Head: Normocephalic.  Cardiovascular:     Rate and Rhythm: Normal rate and regular rhythm.  Abdominal:     General: Abdomen is flat.     Palpations: Abdomen is soft.  Skin:    General: Skin is warm and dry.     Coloration: Skin is not jaundiced or pale.     Comments: Positive pressure dressing on R CFA puncture site. Ecchymosis at 4 O'clock spreading to right inner thigh and right labia, soft and non tender. A small, round mass like firmness noted under the  puncture site, lateral to femoral artery. No thrill, non tender.  Puncture site w/o active oozing of blood.   Neurological:     Mental Status: She is alert.     Comments: Alert, awake, and oriented to self, hx dementia at baseline  Speech and comprehension intact EOMs intact, no nystagmus or subjective diplopia. No facial asymmetry. Tongue midline  Motor power 5/5 all 4 Distal pulses 2 +   Psychiatric:        Mood and Affect: Mood normal.        Behavior: Behavior normal.     Imaging: ECHOCARDIOGRAM COMPLETE  Result Date: 09/20/2022    ECHOCARDIOGRAM REPORT   Patient Name:   Melissa [name] Date of Exam: 09/20/2022 Medical Rec #:  [id number]        Height:       62.0 in Accession #:    [id number]       Weight:       162.9 lb Date of Birth:  10-Jul-1945        BSA:          1.752 m Patient Age:    77 years         BP:           118/50 mmHg Patient Gender: F  HR:           53 bpm. Exam Location:  Inpatient Procedure: 2D Echo, 3D Echo, Cardiac Doppler and Color Doppler Indications:    Stroke  History:        Patient has prior history of Echocardiogram examinations, most                 recent 12/08/2017. CAD, Abnormal ECG, Stroke and COPD,                 Arrythmias:Atrial Fibrillation, Signs/Symptoms:Altered Mental                 Status and Alzheimer's; Risk Factors:Dyslipidemia. Pericardial                 effusion.  Sonographer:    Sheralyn Boatman RDCS Referring Phys: [id number] ERIC LINDZEN  Sonographer Comments: Suboptimal apical window. IMPRESSIONS  1. Left ventricular ejection fraction, by estimation, is 60 to 65%. Left ventricular ejection fraction by 3D volume is 64 %. The left ventricle has normal function. The left ventricle has no regional wall motion abnormalities. Left ventricular diastolic  parameters are indeterminate.  2. Right ventricular systolic function is normal. The right ventricular size is normal. Tricuspid regurgitation signal is inadequate for assessing PA pressure.  3.  The mitral valve is normal in structure. No evidence of mitral valve regurgitation. No evidence of mitral stenosis.  4. The aortic valve is tricuspid. Aortic valve regurgitation is not visualized. No aortic stenosis is present. FINDINGS  Left Ventricle: Left ventricular ejection fraction, by estimation, is 60 to 65%. Left ventricular ejection fraction by 3D volume is 64 %. The left ventricle has normal function. The left ventricle has no regional wall motion abnormalities. The left ventricular internal cavity size was normal in size. There is no left ventricular hypertrophy. Left ventricular diastolic parameters are indeterminate. Right Ventricle: The right ventricular size is normal. No increase in right ventricular wall thickness. Right ventricular systolic function is normal. Tricuspid regurgitation signal is inadequate for assessing PA pressure. Left Atrium: Left atrial size was normal in size. Right Atrium: Right atrial size was normal in size. Pericardium: Trivial pericardial effusion is present. Mitral Valve: The mitral valve is normal in structure. No evidence of mitral valve regurgitation. No evidence of mitral valve stenosis. Tricuspid Valve: The tricuspid valve is normal in structure. Tricuspid valve regurgitation is trivial. Aortic Valve: The aortic valve is tricuspid. Aortic valve regurgitation is not visualized. No aortic stenosis is present. Pulmonic Valve: The pulmonic valve was not well visualized. Pulmonic valve regurgitation is not visualized. Aorta: The aortic root and ascending aorta are structurally normal, with no evidence of dilitation. IAS/Shunts: The interatrial septum was not well visualized.  LEFT VENTRICLE PLAX 2D LVIDd:         4.70 cm LVIDs:         2.70 cm LV PW:         1.10 cm         3D Volume EF LV IVS:        0.80 cm         LV 3D EF:    Left LVOT diam:     2.10 cm                      ventricul LV SV:         83  ar LV SV Index:   47                            ejection LVOT Area:     3.46 cm                     fraction                                             by 3D                                             volume is                                             64 %.                                 3D Volume EF:                                3D EF:        64 %                                LV EDV:       108 ml                                LV ESV:       38 ml                                LV SV:        70 ml RIGHT VENTRICLE             IVC RV S prime:     10.10 cm/s  IVC diam: 2.40 cm TAPSE (M-mode): 1.1 cm LEFT ATRIUM             Index        RIGHT ATRIUM           Index LA diam:        4.40 cm 2.51 cm/m   RA Area:     15.40 cm LA Vol (A2C):   46.2 ml 26.37 ml/m  RA Volume:   39.10 ml  22.32 ml/m LA Vol (A4C):   39.2 ml 22.37 ml/m LA Biplane Vol: 45.0 ml 25.68 ml/m  AORTIC VALVE LVOT Vmax:   116.00 cm/s LVOT Vmean:  75.200 cm/s LVOT VTI:    0.240 m  AORTA Ao Root diam: 2.90 cm Ao Asc diam:  3.20 cm MITRAL VALVE MV Area (PHT): 2.26 cm     SHUNTS MV Decel Time: 335 msec     Systemic VTI:  0.24 m MV E velocity: 109.00 cm/s  Systemic Diam: 2.10 cm Cristal Deer  Bjorn Pippin MD Electronically signed by Epifanio Lesches MD Signature Date/Time: 09/20/2022/3:39:40 PM    Final    DG Abd 1 View  Result Date: 09/20/2022 CLINICAL DATA:  Confirm orogastric tube placement. EXAM: ABDOMEN - 1 VIEW COMPARISON:  12/17/2017 and CT of the abdomen and pelvis on 12/18/2017 FINDINGS: Enteric tube has been placed, tip overlying the level of the proximal stomach. Side port is in the region of the gastroesophageal junction. Course of the tube in the thorax is consistent with hiatal hernia. Visualized bowel gas pattern is nonobstructive. IMPRESSION: Enteric tube tip overlies the level of the proximal stomach. Electronically Signed   By: Norva Pavlov M.D.   On: 09/20/2022 09:37   CT HEAD WO CONTRAST ( )  Result Date: 09/20/2022 CLINICAL DATA:  77 year old female code  stroke presentation yesterday at [facility] left MCA M1 ELVO. Status post endovascular revascularization. EXAM: CT HEAD WITHOUT CONTRAST TECHNIQUE: Contiguous axial images were obtained from the base of the skull through the vertex without intravenous contrast. RADIATION DOSE REDUCTION: This exam was performed according to the departmental dose-optimization program which includes automated exposure control, adjustment of the mA and/or kV according to patient size and/or use of iterative reconstruction technique. COMPARISON:  Code stroke presentation head CT yesterday. FINDINGS: Brain: Residual intravascular contrast. Chronic left basal ganglia lacunar infarct. Gray-white differentiation in both hemispheres appears stable since yesterday. No definite acute or evolving left MCA infarct by CT. No acute intracranial hemorrhage identified. No midline shift, mass effect, or evidence of intracranial mass lesion. No ventriculomegaly. Vascular: New left MCA M1 segment vascular stent. Calcified atherosclerosis at the skull base. Skull: No acute osseous abnormality identified. Sinuses/Orbits: Intubated now. Visualized paranasal sinuses and mastoids are stable and well aerated. Other: No acute orbit or scalp soft tissue finding. IMPRESSION: 1. New Left MCA vascular stent and some residual intravascular contrast. 2. No definite acute or evolving infarct by CT. No acute intracranial hemorrhage or mass effect. 3. Chronic left basal ganglia lacunar infarct. Electronically Signed   By: Odessa Fleming M.D.   On: 09/20/2022 08:31   DG Chest Port 1 View  Result Date: 09/20/2022 CLINICAL DATA:  [id number], [id number].  Check NGT, ETT. EXAM: PORTABLE CHEST 1 VIEW COMPARISON:  CTA chest, abdomen and pelvis 01/27/2022, chest earlier today at 12:33 a.m. FINDINGS: 6:47 a.m. ETT interval insertion with the tip 3 cm from the carina. NGT is within a sizable hiatal hernia. Based on the prior CTA chest the sidehole is in the distal thoracic  esophagus and should be advanced further in about 10 cm. On that study, just over 1/2 of the stomach was intrathoracic and inverted. The lungs are clear of infiltrates.  No pleural effusion is seen. There is stable moderate to severe cardiomegaly and central vascular fullness without evidence of acute CHF. The aortic arch is heavily calcified.  The mediastinum is stable. Osteopenia and degenerative change thoracic spine. IMPRESSION: 1. ETT interval insertion with the tip 3 cm from the carina. 2. NGT is within a sizable hiatal hernia. Based on the prior CTA chest the sidehole is in the distal thoracic esophagus and should be advanced further in about 10 cm. 3. No acute chest findings.  Stable cardiomegaly. Electronically Signed   By: Almira Bar M.D.   On: 09/20/2022 07:35    Labs:  CBC: Recent Labs    09/20/22 0504 09/20/22 0514 09/20/22 0803 09/20/22 1220 09/21/22 0309  WBC 9.7  --   --   --  7.8  HGB 10.0* 11.2*  10.9* 10.2* 8.7*  HCT 32.6* 33.0* 32.0* 30.0* 28.5*  PLT 330  --   --   --  290     COAGS: Recent Labs    09/20/22 0504  INR 1.1  APTT 78*     BMP: Recent Labs    09/20/22 0218 09/20/22 0504 09/20/22 0514 09/20/22 0803 09/20/22 1220 09/21/22 0309  NA 137 135 138 140 139 135  K 4.1 3.4* 3.6 3.1* 3.9 4.6  CL 104 103  --   --   --  107  CO2  --  21*  --   --   --  19*  GLUCOSE 134* 170*  --   --   --  122*  BUN 9 5*  --   --   --  14  CALCIUM  --  8.8*  --   --   --  8.8*  CREATININE 0.90 0.95  --   --   --  1.02*  GFRNONAA  --  >60  --   --   --  56*     LIVER FUNCTION TESTS: Recent Labs    09/20/22 0504  BILITOT 0.8  AST 18  ALT 15  ALKPHOS 97  PROT 6.5  ALBUMIN 3.5     Assessment and Plan:  77 y.o. 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 Corliss Skains on 5/18.  Patient extubated, able to answer questions appropriately and  follow commands.  Motor power 5/5 all 4.  R CFA site w/o appreciable pseudoaneurysm but small, round mass like firmness under the puncture site, lateral to R CFA. Discussed with Dr. Corliss Skains, Timmothy Euler [place] ordered.  PT to be hold till Vas [place] resulted- RN notified.   Continue DAPT with ASA 81 mg qd and Brilinta 90 mg BID.   Further treatment plan per Stroke team/PCCM  Appreciate and agree with the plan.  NIR to follow.    Electronically Signed: Willette Brace, PA-C 09/21/2022, 11:21 AM   I spent a total of 15 Minutes at the the patient's bedside AND on the patient's hospital floor or unit, greater than 50% of which was counseling/coordinating care for Lt MCA stent placement f/u.  This chart was dictated using voice recognition software.  Despite best efforts to proofread,  errors can occur which can change the documentation meaning.

## 2022-09-21 NOTE — Progress Notes (Signed)
NAME:  Melissa [name], MRN:  [id number], DOB:  03/15/1946, LOS: 1 ADMISSION DATE:  09/20/2022, CONSULTATION DATE:  09/20/2022 REFERRING MD:  Dr. Corliss Skains, Neuro-IR, CHIEF COMPLAINT:  Rt sided weakness   History of Present Illness:   77 yo female from home presented to [facility] ER with AMS, slurred speech, Rt sided weakness, Lt gaze preference, productive cough with concern for aspiration.  CT head concerning for Lt M1 occlusion.  She was transferred to [facility] and had revascularization and stenting.  She remained intubated after the procedure.  PCCM consulted to assist with management in ICU.  Pertinent  Medical History  Anemia, Anxiety, OA, Asthma, CHF, COPD, Back pain, Dementia, Depression, DM type 2, Fibromyalgia, PUD, GERD, GBS, HLD, HTN, Migraine headache, Pneumonia, OSA, CVA, IDA, CAD, A fib  Significant Hospital Events: Including procedures, antibiotic start and stop dates in addition to other pertinent events   5/18 Admit, Lt common carotid arteriogram with revascularization of M1  Interim History / Subjective:   Awake today, weaning on pressure support and following commands.  Objective   Blood pressure (!) 151/59, pulse (!) 54, temperature 98.5 F (36.9 C), temperature source Axillary, resp. rate (!) 24, height 5\' 2"  (1.575 m), weight 73.9 kg, SpO2 99 %.    Vent Mode: PRVC FiO2 (%):  [40 %] 40 % Set Rate:  [24 bmp] 24 bmp Vt Set:  [400 mL] 400 mL PEEP:  [5 cmH20] 5 cmH20 Plateau Pressure:  [14 cmH20-15 cmH20] 15 cmH20   Intake/Output Summary (Last 24 hours) at 09/21/2022 1914 Last data filed at 09/21/2022 0700 Gross per 24 hour  Intake 2636.41 ml  Output 900 ml  Net 1736.41 ml   Filed Weights   09/20/22 0452  Weight: 73.9 kg    Examination: Gen:      No acute distress HEENT:  EOMI, sclera anicteric Neck:     No masses; no thyromegaly, ETT Lungs:    Clear to auscultation bilaterally; normal respiratory effort CV:         Regular rate and rhythm; no murmurs Abd:       + bowel sounds; soft, non-tender; no palpable masses, no distension Ext:    No edema; adequate peripheral perfusion Skin:      Warm and dry; no rash Neuro: Awake, follows commands  Labs/imaging reviewed Significant for creatinine 1.02 Hemoglobin 8.7 No new imaging  Resolved Hospital Problem list     Assessment & Plan:  Acute CVA s/p revascularization of Lt M1. Hx of Dementia, CVA, Anxiety, Depression, Fibromyalgia. Per neurology and neuro-IR  Compromised airway in setting of CVA with possible aspiration. Hx of COPD, Asthma, OSA. - Doing well on pressure support weans.  Plan on extubation today - Intermittent X ray  Hypertension. Chronic diastolic CHF, Chronic A fib, CAD, HLD. -Off Cleviprex drip -Hydralazine as needed - Echo reviewed with EF 60 to 65%.  Normal RV size and function.  Hx of Upper GI bleeding from PUD, GERD, Hiatal hernia. - not on anticoagulation as outpt due to recurrent GI bleeding - continue protonix and monitor for GI bleeding  Difficulty placing OG tube. - might be related to mod/large hiatal hernia  DM type 2 poorly controlled with hyperglycemia. - SSI  Best Practice (right click and "Reselect all SmartList Selections" daily)   Diet/type: NPO DVT prophylaxis: SCD GI prophylaxis: PPI Lines: N/A Foley:  N/A Code Status:  full code Last date of multidisciplinary goals of care discussion [x]   Critical care time:    The  patient is critically ill with multiple organ system failure and requires high complexity decision making for assessment and support, frequent evaluation and titration of therapies, advanced monitoring, review of radiographic studies and interpretation of complex data.   Critical Care Time devoted to patient care services, exclusive of separately billable procedures, described in this note is 35 minutes.   Chilton Greathouse MD [facility] See Amion for pager  If no response to pager , please call [phone] until 7pm After 7:00 pm call Elink  [phone] 09/21/2022, 8:12 AM

## 2022-09-21 NOTE — Progress Notes (Signed)
eLink Physician-Brief Progress Note Patient Name: KAYLEIGH RUCKEL DOB: [birth year] MRN: [id number]   Date of Service  09/21/2022  HPI/Events of Note  Oliguric with only 200cc in the last 7 hours Foley in place BP with SBP 120-150  eICU Interventions  LR 500 cc Bladder scan     Intervention Category Intermediate Interventions: Oliguria - evaluation and management  Diedre Maclellan Mechele Collin 09/21/2022, 12:47 AM

## 2022-09-22 ENCOUNTER — Encounter (HOSPITAL_COMMUNITY): Payer: Self-pay | Admitting: Interventional Radiology

## 2022-09-22 ENCOUNTER — Inpatient Hospital Stay (HOSPITAL_COMMUNITY): Payer: 59

## 2022-09-22 ENCOUNTER — Encounter (HOSPITAL_COMMUNITY): Payer: 59

## 2022-09-22 ENCOUNTER — Telehealth (HOSPITAL_COMMUNITY): Payer: Self-pay

## 2022-09-22 ENCOUNTER — Other Ambulatory Visit (HOSPITAL_COMMUNITY): Payer: Self-pay

## 2022-09-22 DIAGNOSIS — I63312 Cerebral infarction due to thrombosis of left middle cerebral artery: Secondary | ICD-10-CM

## 2022-09-22 DIAGNOSIS — I724 Aneurysm of artery of lower extremity: Secondary | ICD-10-CM | POA: Diagnosis not present

## 2022-09-22 DIAGNOSIS — I63412 Cerebral infarction due to embolism of left middle cerebral artery: Secondary | ICD-10-CM | POA: Diagnosis not present

## 2022-09-22 LAB — GLUCOSE, CAPILLARY
Glucose-Capillary: 101 mg/dL — ABNORMAL HIGH (ref 70–99)
Glucose-Capillary: 109 mg/dL — ABNORMAL HIGH (ref 70–99)
Glucose-Capillary: 110 mg/dL — ABNORMAL HIGH (ref 70–99)
Glucose-Capillary: 112 mg/dL — ABNORMAL HIGH (ref 70–99)
Glucose-Capillary: 77 mg/dL (ref 70–99)
Glucose-Capillary: 88 mg/dL (ref 70–99)
Glucose-Capillary: 90 mg/dL (ref 70–99)

## 2022-09-22 MED ORDER — DOCUSATE SODIUM 100 MG PO CAPS
100.0000 mg | ORAL_CAPSULE | Freq: Every day | ORAL | Status: DC
  Administered 2022-09-23: 100 mg via ORAL
  Filled 2022-09-22: qty 1

## 2022-09-22 MED ORDER — ENALAPRILAT 1.25 MG/ML IV SOLN
0.6250 mg | Freq: Once | INTRAVENOUS | Status: AC
  Administered 2022-09-22: 0.625 mg via INTRAVENOUS
  Filled 2022-09-22: qty 1

## 2022-09-22 MED ORDER — LISINOPRIL 2.5 MG PO TABS
5.0000 mg | ORAL_TABLET | Freq: Every day | ORAL | Status: DC
  Administered 2022-09-22 – 2022-09-23 (×2): 5 mg via ORAL
  Filled 2022-09-22: qty 1
  Filled 2022-09-22: qty 2

## 2022-09-22 MED ORDER — ROSUVASTATIN CALCIUM 20 MG PO TABS
40.0000 mg | ORAL_TABLET | Freq: Every day | ORAL | Status: DC
  Administered 2022-09-23: 40 mg via ORAL
  Filled 2022-09-22: qty 2

## 2022-09-22 MED ORDER — PANTOPRAZOLE SODIUM 40 MG PO TBEC
40.0000 mg | DELAYED_RELEASE_TABLET | Freq: Every day | ORAL | Status: DC
  Administered 2022-09-23: 40 mg via ORAL
  Filled 2022-09-22: qty 1

## 2022-09-22 MED ORDER — TICAGRELOR 90 MG PO TABS: 45.0000 mg | ORAL_TABLET | Freq: Two times a day (BID) | ORAL | Status: DC

## 2022-09-22 MED ORDER — TICAGRELOR 90 MG PO TABS
45.0000 mg | ORAL_TABLET | Freq: Two times a day (BID) | ORAL | Status: DC
  Administered 2022-09-22 – 2022-09-23 (×3): 45 mg via ORAL
  Filled 2022-09-22 (×3): qty 1

## 2022-09-22 MED ORDER — POLYETHYLENE GLYCOL 3350 17 G PO PACK
17.0000 g | PACK | Freq: Every day | ORAL | Status: DC
  Administered 2022-09-23: 17 g via ORAL
  Filled 2022-09-22: qty 1

## 2022-09-22 MED ORDER — HYDRALAZINE HCL 20 MG/ML IJ SOLN
10.0000 mg | Freq: Four times a day (QID) | INTRAMUSCULAR | Status: DC | PRN
  Administered 2022-09-23: 20 mg via INTRAVENOUS
  Filled 2022-09-22: qty 1

## 2022-09-22 NOTE — Progress Notes (Signed)
Referring Physician(s): Dr. Caryl Pina  Supervising Physician: Julieanne Cotton  Patient Status:  [facility] - In-pt  Chief Complaint: L MCA occlusion  Subjective: Patient resting this AM.  Oriented to self and place.  MS/orientation waxes and wanes- was reportedly AO x4 earlier this AM.  Groin site exquistely tender this AM.    Allergies: Nsaids, Codeine, Lyrica [pregabalin], and Penicillins  Medications: Prior to Admission medications   Medication Sig Start Date End Date Taking? Authorizing Provider  albuterol (VENTOLIN HFA) 108 (90 Base) MCG/ACT inhaler Inhale 2 puffs into the lungs every 6 (six) hours as needed for wheezing or shortness of breath.   Yes [provider]  ferrous sulfate 325 (65 FE) MG tablet Take 1 tablet (325 mg total) by mouth daily. Patient taking differently: Take 325 mg by mouth daily with breakfast. 12/09/17 09/20/22 Yes Tyrone Nine, MD  omeprazole (PRILOSEC) 40 MG capsule Take 40 mg by mouth daily before breakfast. 07/07/22  Yes [provider]  TYLENOL 500 MG tablet Take 500-1,000 mg by mouth every 6 (six) hours as needed for mild pain or headache.   Yes [provider]  pantoprazole (PROTONIX) 40 MG tablet Take 1 tablet (40 mg total) by mouth 2 (two) times daily. Patient not taking: Reported on 09/20/2022 12/09/17 09/20/22  Tyrone Nine, MD     Vital Signs: BP (!) 169/91 (BP Location: Right Arm)   Pulse (!) 52   Temp 98.5 F (36.9 C) (Oral)   Resp 14   Ht 5\' 2"  (1.575 m)   Wt 162 lb 14.7 oz (73.9 kg)   SpO2 97%   BMI 29.80 kg/m   Physical Exam NAD, alert.  Oriented x3 Neuro: EOMs intact, facial symmetry, moving all extremities.  Follows all commands Skin: Further area of eccymosis to groin. Tender directly over puncture site-- note there was a hematoma here by [place] yesterday.  Pulses: intact DP  Imaging: VAS [place] GROIN PSEUDOANEURYSM  Result Date: 09/21/2022  ARTERIAL PSEUDOANEURYSM  Patient Name:  Melissa [name]   Date of Exam:   09/21/2022 Medical Rec #: [id number]         Accession #:    [id number] Date of Birth: [birth year]         Patient Gender: F Patient Age:   77 years Exam Location:  [facility] Procedure:      VAS [place] Bobetta Lime Referring Phys: Lawernce Ion --------------------------------------------------------------------------------  Exam: Right groin Indications: Patient complains of palpable knot. History: S/p Lt MCA thrombectomy and stent placement by Dr. Corliss Skains on 5/18. Comparison Study: No prior study Performing Technologist: Sherren Kerns RVS  Examination Guidelines: A complete evaluation includes B-mode imaging, spectral Doppler, color Doppler, and power Doppler as needed of all accessible portions of each vessel. Bilateral testing is considered an integral part of a complete examination. Limited examinations for reoccurring indications may be performed as noted.  Summary: No evidence of pseudoaneurysm, AVF or DVT There is a small hematoma noted at the stick site.    --------------------------------------------------------------------------------    Preliminary    ECHOCARDIOGRAM COMPLETE  Result Date: 09/20/2022    ECHOCARDIOGRAM REPORT   Patient Name:   [name] Date of Exam: 09/20/2022 Medical Rec #:  [id number]        Height:       62.0 in Accession #:    [id number]       Weight:       162.9 lb Date of Birth:  May 27, 1945  BSA:          1.752 m Patient Age:    77 years         BP:           118/50 mmHg Patient Gender: F                HR:           53 bpm. Exam Location:  Inpatient Procedure: 2D Echo, 3D Echo, Cardiac Doppler and Color Doppler Indications:    Stroke  History:        Patient has prior history of Echocardiogram examinations, most                 recent 12/08/2017. CAD, Abnormal ECG, Stroke and COPD,                 Arrythmias:Atrial Fibrillation, Signs/Symptoms:Altered Mental                 Status and Alzheimer's; Risk Factors:Dyslipidemia. Pericardial                  effusion.  Sonographer:    Sheralyn Boatman RDCS Referring Phys: [id number] ERIC LINDZEN  Sonographer Comments: Suboptimal apical window. IMPRESSIONS  1. Left ventricular ejection fraction, by estimation, is 60 to 65%. Left ventricular ejection fraction by 3D volume is 64 %. The left ventricle has normal function. The left ventricle has no regional wall motion abnormalities. Left ventricular diastolic  parameters are indeterminate.  2. Right ventricular systolic function is normal. The right ventricular size is normal. Tricuspid regurgitation signal is inadequate for assessing PA pressure.  3. The mitral valve is normal in structure. No evidence of mitral valve regurgitation. No evidence of mitral stenosis.  4. The aortic valve is tricuspid. Aortic valve regurgitation is not visualized. No aortic stenosis is present. FINDINGS  Left Ventricle: Left ventricular ejection fraction, by estimation, is 60 to 65%. Left ventricular ejection fraction by 3D volume is 64 %. The left ventricle has normal function. The left ventricle has no regional wall motion abnormalities. The left ventricular internal cavity size was normal in size. There is no left ventricular hypertrophy. Left ventricular diastolic parameters are indeterminate. Right Ventricle: The right ventricular size is normal. No increase in right ventricular wall thickness. Right ventricular systolic function is normal. Tricuspid regurgitation signal is inadequate for assessing PA pressure. Left Atrium: Left atrial size was normal in size. Right Atrium: Right atrial size was normal in size. Pericardium: Trivial pericardial effusion is present. Mitral Valve: The mitral valve is normal in structure. No evidence of mitral valve regurgitation. No evidence of mitral valve stenosis. Tricuspid Valve: The tricuspid valve is normal in structure. Tricuspid valve regurgitation is trivial. Aortic Valve: The aortic valve is tricuspid. Aortic valve regurgitation is not visualized. No  aortic stenosis is present. Pulmonic Valve: The pulmonic valve was not well visualized. Pulmonic valve regurgitation is not visualized. Aorta: The aortic root and ascending aorta are structurally normal, with no evidence of dilitation. IAS/Shunts: The interatrial septum was not well visualized.  LEFT VENTRICLE PLAX 2D LVIDd:         4.70 cm LVIDs:         2.70 cm LV PW:         1.10 cm         3D Volume EF LV IVS:        0.80 cm         LV 3D EF:    Left LVOT  diam:     2.10 cm                      ventricul LV SV:         83                           ar LV SV Index:   47                           ejection LVOT Area:     3.46 cm                     fraction                                             by 3D                                             volume is                                             64 %.                                 3D Volume EF:                                3D EF:        64 %                                LV EDV:       108 ml                                LV ESV:       38 ml                                LV SV:        70 ml RIGHT VENTRICLE             IVC RV S prime:     10.10 cm/s  IVC diam: 2.40 cm TAPSE (M-mode): 1.1 cm LEFT ATRIUM             Index        RIGHT ATRIUM           Index LA diam:        4.40 cm 2.51 cm/m   RA Area:     15.40 cm LA Vol (A2C):   46.2 ml 26.37 ml/m  RA Volume:   39.10 ml  22.32 ml/m LA Vol (A4C):   39.2 ml 22.37 ml/m LA Biplane Vol: 45.0 ml 25.68 ml/m  AORTIC VALVE LVOT Vmax:  116.00 cm/s LVOT Vmean:  75.200 cm/s LVOT VTI:    0.240 m  AORTA Ao Root diam: 2.90 cm Ao Asc diam:  3.20 cm MITRAL VALVE MV Area (PHT): 2.26 cm     SHUNTS MV Decel Time: 335 msec     Systemic VTI:  0.24 m MV E velocity: 109.00 cm/s  Systemic Diam: 2.10 cm Epifanio Lesches MD Electronically signed by Epifanio Lesches MD Signature Date/Time: 09/20/2022/3:39:40 PM    Final    DG Abd 1 View  Result Date: 09/20/2022 CLINICAL DATA:  Confirm orogastric tube placement.  EXAM: ABDOMEN - 1 VIEW COMPARISON:  12/17/2017 and CT of the abdomen and pelvis on 12/18/2017 FINDINGS: Enteric tube has been placed, tip overlying the level of the proximal stomach. Side port is in the region of the gastroesophageal junction. Course of the tube in the thorax is consistent with hiatal hernia. Visualized bowel gas pattern is nonobstructive. IMPRESSION: Enteric tube tip overlies the level of the proximal stomach. Electronically Signed   By: Norva Pavlov M.D.   On: 09/20/2022 09:37   CT HEAD WO CONTRAST ( )  Result Date: 09/20/2022 CLINICAL DATA:  77 year old female code stroke presentation yesterday at [facility] left MCA M1 ELVO. Status post endovascular revascularization. EXAM: CT HEAD WITHOUT CONTRAST TECHNIQUE: Contiguous axial images were obtained from the base of the skull through the vertex without intravenous contrast. RADIATION DOSE REDUCTION: This exam was performed according to the departmental dose-optimization program which includes automated exposure control, adjustment of the mA and/or kV according to patient size and/or use of iterative reconstruction technique. COMPARISON:  Code stroke presentation head CT yesterday. FINDINGS: Brain: Residual intravascular contrast. Chronic left basal ganglia lacunar infarct. Gray-white differentiation in both hemispheres appears stable since yesterday. No definite acute or evolving left MCA infarct by CT. No acute intracranial hemorrhage identified. No midline shift, mass effect, or evidence of intracranial mass lesion. No ventriculomegaly. Vascular: New left MCA M1 segment vascular stent. Calcified atherosclerosis at the skull base. Skull: No acute osseous abnormality identified. Sinuses/Orbits: Intubated now. Visualized paranasal sinuses and mastoids are stable and well aerated. Other: No acute orbit or scalp soft tissue finding. IMPRESSION: 1. New Left MCA vascular stent and some residual intravascular contrast. 2. No definite  acute or evolving infarct by CT. No acute intracranial hemorrhage or mass effect. 3. Chronic left basal ganglia lacunar infarct. Electronically Signed   By: Odessa Fleming M.D.   On: 09/20/2022 08:31   DG Chest Port 1 View  Result Date: 09/20/2022 CLINICAL DATA:  [id number], [id number].  Check NGT, ETT. EXAM: PORTABLE CHEST 1 VIEW COMPARISON:  CTA chest, abdomen and pelvis 01/27/2022, chest earlier today at 12:33 a.m. FINDINGS: 6:47 a.m. ETT interval insertion with the tip 3 cm from the carina. NGT is within a sizable hiatal hernia. Based on the prior CTA chest the sidehole is in the distal thoracic esophagus and should be advanced further in about 10 cm. On that study, just over 1/2 of the stomach was intrathoracic and inverted. The lungs are clear of infiltrates.  No pleural effusion is seen. There is stable moderate to severe cardiomegaly and central vascular fullness without evidence of acute CHF. The aortic arch is heavily calcified.  The mediastinum is stable. Osteopenia and degenerative change thoracic spine. IMPRESSION: 1. ETT interval insertion with the tip 3 cm from the carina. 2. NGT is within a sizable hiatal hernia. Based on the prior CTA chest the sidehole is in the distal thoracic esophagus and should be advanced further  in about 10 cm. 3. No acute chest findings.  Stable cardiomegaly. Electronically Signed   By: Almira Bar M.D.   On: 09/20/2022 07:35    Labs:  CBC: Recent Labs    09/20/22 0504 09/20/22 0514 09/20/22 0803 09/20/22 1220 09/21/22 0309  WBC 9.7  --   --   --  7.8  HGB 10.0* 11.2* 10.9* 10.2* 8.7*  HCT 32.6* 33.0* 32.0* 30.0* 28.5*  PLT 330  --   --   --  290    COAGS: Recent Labs    09/20/22 0504  INR 1.1  APTT 78*    BMP: Recent Labs    09/20/22 0218 09/20/22 0504 09/20/22 0514 09/20/22 0803 09/20/22 1220 09/21/22 0309  NA 137 135 138 140 139 135  K 4.1 3.4* 3.6 3.1* 3.9 4.6  CL 104 103  --   --   --  107  CO2  --  21*  --   --   --  19*  GLUCOSE 134*  170*  --   --   --  122*  BUN 9 5*  --   --   --  14  CALCIUM  --  8.8*  --   --   --  8.8*  CREATININE 0.90 0.95  --   --   --  1.02*  GFRNONAA  --  >60  --   --   --  56*    LIVER FUNCTION TESTS: Recent Labs    09/20/22 0504  BILITOT 0.8  AST 18  ALT 15  ALKPHOS 97  PROT 6.5  ALBUMIN 3.5    Assessment and Plan: 78 y.o. 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 Corliss Skains on 5/18.  Patient doing well neurologically this AM.  R groin site remains exquisitely tender, possible lateral extensive of bruising.  Will decreased Brilinta dosing by half to 45mg  BID, continue aspirin 81mg  daily.  Repeat Vas [place] groin to ensure stability.  Bed rest until result from [place] US obtained.   IR following.   Electronically Signed: Hoyt Koch, PA 09/22/2022, 8:48 AM   I spent a total of 25 Minutes at the the patient's bedside AND on the patient's hospital floor or unit, greater than 50% of which was counseling/coordinating care for L MCA occlusion.

## 2022-09-22 NOTE — Progress Notes (Signed)
PT Cancellation Note  Patient Details Name: AUBRYELLA HOOSE MRN: [id number] DOB: 1946/02/10   Cancelled Treatment:    Reason Eval/Treat Not Completed: Other (comment)  Noted pt awaiting ultrasound of Rt groin with KI ordered. Will wait to see if cleared for activity.   Jerolyn Center, PT Acute Rehabilitation Services  Office [phone]   Zena Amos 09/22/2022, 9:50 AM

## 2022-09-22 NOTE — Progress Notes (Addendum)
STROKE TEAM PROGRESS NOTE   INTERVAL HISTORY  No family at the bedside.  RN at the bedside Extubated 5/19, stable respiratory status. Now on diet.   MRI pending.   Right groin site replant remains tender, ultrasound shows no sign of pseudoaneurysm, aVF or DVT.  IR decreased Brilinta in half to 45 mg twice daily aspirin continued. Costs of Brilinta reviewed; patient should have no co-pay.   Probable discharge 5/21. Therapy recommending Home Health PT/OT.    Vitals:   09/22/22 1100 09/22/22 1200 09/22/22 1300 09/22/22 1403  BP: (!) 168/69 (!) 160/63 (!) 162/64 (!) 176/69  Pulse: 71 75 70 78  Resp: 17 19 20    Temp:  99.7 F (37.6 C)    TempSrc:  Axillary    SpO2: 94% 95% 93% 97%  Weight:      Height:       CBC:  Recent Labs  Lab 09/20/22 0504 09/20/22 0514 09/20/22 1220 09/21/22 0309  WBC 9.7  --   --  7.8  NEUTROABS 8.8*  --   --   --   HGB 10.0*   < > 10.2* 8.7*  HCT 32.6*   < > 30.0* 28.5*  MCV 77.1*  --   --  77.0*  PLT 330  --   --  290   < > = values in this interval not displayed.    Basic Metabolic Panel:  Recent Labs  Lab 09/20/22 0504 09/20/22 0514 09/20/22 1043 09/20/22 1220 09/21/22 0309  NA 135   < >  --  139 135  K 3.4*   < >  --  3.9 4.6  CL 103  --   --   --  107  CO2 21*  --   --   --  19*  GLUCOSE 170*  --   --   --  122*  BUN 5*  --   --   --  14  CREATININE 0.95  --   --   --  1.02*  CALCIUM 8.8*  --   --   --  8.8*  MG  --   --  1.8  --  2.1  PHOS  --   --  3.6  --  4.4   < > = values in this interval not displayed.    Lipid Panel:  Recent Labs  Lab 09/20/22 0504 09/21/22 0309  CHOL 192  --   TRIG 92 86  HDL 54  --   CHOLHDL 3.6  --   VLDL 18  --   LDLCALC 120*  --     HgbA1c:  Recent Labs  Lab 09/21/22 0309  HGBA1C 5.5    Urine Drug Screen: No results for input(s): "LABOPIA", "COCAINSCRNUR", "LABBENZ", "AMPHETMU", "THCU", "LABBARB" in the last 168 hours.  Alcohol Level No results for input(s): "ETH" in the last 168  hours.  IMAGING past 24 hours VAS [place] GROIN PSEUDOANEURYSM  Result Date: 09/22/2022  ARTERIAL PSEUDOANEURYSM  Patient Name:  Melissa [name]  Date of Exam:   09/22/2022 Medical Rec #: [id number]         Accession #:    [id number] Date of Birth: 02-Sep-1945         Patient Gender: F Patient Age:   77 years Exam Location:  [facility] Procedure:      VAS [place] Bobetta Lime Referring Phys: Lannette Donath MATTHEWS --------------------------------------------------------------------------------  Exam: Right groin Indications: Patient complains of groin/proximal thigh pain, bruising and palpable knot. History: S/p  Lt MCA thrombectomy and stent placement by Dr. Corliss Skains on. Comparison Study: Prior study done 09/21/2022 Performing Technologist: Sherren Kerns RVS  Examination Guidelines: A complete evaluation includes B-mode imaging, spectral Doppler, color Doppler, and power Doppler as needed of all accessible portions of each vessel. Bilateral testing is considered an integral part of a complete examination. Limited examinations for reoccurring indications may be performed as noted.  Summary: No evidence of pseudoaneurysm, AVF or DVT Small hematoma at stick site. No change since prior study done 09/21/2022.  Diagnosing physician: Sherald Hess MD Electronically signed by Sherald Hess MD on 09/22/2022 at 4:33:11 PM.   --------------------------------------------------------------------------------    Final     PHYSICAL EXAM  Temp:  [98 F (36.7 C)-99.7 F (37.6 C)] 99.7 F (37.6 C) (05/20 1200) Pulse Rate:  [39-84] 78 (05/20 1403) Resp:  [14-27] 20 (05/20 1300) BP: (139-186)/(51-145) 176/69 (05/20 1403) SpO2:  [93 %-100 %] 97 % (05/20 1403)  General -NAD Cardiovascular -A-fib on monitor  Mental Status -  Awake, alert. Oriented to name, place but not year/month. Pupils equal round reactive.  Slight right facial droop. No dysarthria. No aphasia. No visual field cut. Follows commands. Tongue  midline. Right arm 4/5 with drift. Left arm and b/l legs no drift.  Sensory: intact to LT.  No ataxia.  Gait not examined.   ASSESSMENT/PLAN Ms. Melissa [name] is a 77 y.o. female with history of A-fib not on anticoagulation, hypertension, hyperlipidemia, CHF, diabetes, dementia, recurrent GI bleed, anxiety and depression, COPD, history of Guillain-Barr, migraine history, sleep apnea not on CPAP, prior stroke history, GERD, fibromyalgia who initially presented to [facility] with acute onset of right hemiplegia and aphasia. LKN was 8:00 PM on Friday night. Family heard a thumping sound at 11 PM and went to the room she was in, where apparently she had just fallen. Above deficits were noted and EMS was called. No head trauma was reported. Teleneurology evaluated the patient there and she was determined not to be a TNK candidate due to time criteria. NIHSS was 20. CT head revealed focal hypodensity with ASPECTS of 8 and no hemorrhage. CTA revealed a left M1 occlusion. She was emergently transferred to [facility] for mechanical thrombectomy. Extubated 5/19.   Stroke: Acute left MCA ischemic infarct s/p mechanical thrombectomy of left M1 with TICI 3, s/p rescue stent due to reocclusion with return to TICI 3 Etiology: Cardioembolic on no anticoagulation, and intracranial atherosclerosis CT head at outside hospital with focal hypodensity.  Aspects of 8 CTA head & neck at outside hospital revealed a left M1 occlusion Cerebral angio s/p complete revascularization of left M1 1 with TICI 3, s/p placement of rescue stent due to reocclusion secondary to underlying intracranial atherosclerosis with return to TICI 3 Post IR CT no hemorrhage Repeat CT head with no acute infarct R groin vasc US: Negative for pseudoaneurysm  MRI pending  2D Echo EF 60-65%. LDL 120 HgbA1c 5.5 VTE prophylaxis -SCDs    Diet   Diet Carb Modified Fluid consistency: Thin; Room service appropriate? Yes   No  antithrombotics prior to admission, now on on aspirin 81 mg daily and Brilinta 90 mg twice daily.  Will change to Brilinta and Eliquis at discharge. Therapy recommendations: Home Health PT/OT Disposition: Pending  Dementia Anxiety and depression Fibromyalgia No home medications   Hypertension Chronic A-fib on no anticoagulation CAD Chronic diastolic CHF Home meds: Imdur 30 mg BP goal <160 Long-term BP goal normotensive cardiology recommends watchman device, plan for outpatient referral.  Patient may not be candidate d/t comorbidities and dementia.   Hyperlipidemia Home meds: None LDL 120, goal < 70 Con't Crestor 40 mg Continue statin at discharge  COPD Asthma  Diabetes type II Controlled Home meds: None HgbA1c 5.5 goal < 7.0 CBGs Recent Labs    09/22/22 0425 09/22/22 0821 09/22/22 1159  GLUCAP 88 77 90     SSI  Dysphagia History of GI bleed secondary to PUD Hiatal hernia GERD Speech therapy to follow--now on diet Protonix  Other Stroke Risk Factors Advanced Age >/= 65  Hx stroke/TIA CAD Migraines Obstructive sleep apnea, not on CPAP at home Chronic CHF  Other Active Problems Chronic anemia-Hgb 10.2 -> 8.7. No signs of bleeding, will check in AM, monitor.  Hypokalemia-K3.4.  Will replace and check in the morning -> 4.6  Hospital day # 2  Pt seen by Neuro NP/APP and later by MD. Note/plan to be edited by MD as needed.    Lynnae January, DNP, AGACNP-BC [facility] Please use AMION for contact information & EPIC for messaging.  I have personally obtained history,examined this patient, reviewed notes, independently viewed imaging studies, participated in medical decision making and plan of care.ROS completed by me personally and pertinent positives fully documented  I have made any additions or clarifications directly to the above note. Agree with note above.  Patient is doing well neurologically but has right groin tenderness and some  swelling.  Groin ultrasound does not show any pseudoaneurysm.  Recommend mobilize out of bed.  Therapy consults.  Aspirin and Brilinta for now but will change to Brilinta and Eliquis at discharge and aggressive risk factor modification.  Likely discharge home tomorrow if cleared by physical Occupational Therapy.This patient is critically ill and at significant risk of neurological worsening, death and care requires constant monitoring of vital signs, hemodynamics,respiratory and cardiac monitoring, extensive review of multiple databases, frequent neurological assessment, discussion with family, other specialists and medical decision making of high complexity.I have made any additions or clarifications directly to the above note.This critical care time does not reflect procedure time, or teaching time or supervisory time of PA/NP/Med Resident etc but could involve care discussion time.  I spent 30 minutes of neurocritical care time  in the care of  this patient.      Delia Heady, MD Medical Director [facility] Pager: [phone] 09/22/2022 5:33 PM   To contact Stroke Continuity provider, please refer to [email or web address]. After hours, contact General Neurology

## 2022-09-22 NOTE — Telephone Encounter (Signed)
Pharmacy Patient Advocate Encounter  Insurance verification completed.    The patient is insured through [employer]   The patient is currently admitted and ran test claims for the following: Brilinta.  Copays and coinsurance results were relayed to Inpatient clinical team.   

## 2022-09-22 NOTE — TOC Initial Note (Signed)
Transition of Care [facility]) - Initial/Assessment Note    Patient Details  Name: Melissa [name] MRN: [id number] Date of Birth: 26-Sep-1945  Transition of Care [facility]) CM/SW Contact:    Kermit Balo, RN Phone Number: 09/22/2022, 3:44 PM  Clinical Narrative:                 Pt is from home with her son. She states he is with her all the time.  Pt uses oxygen at home as needed. She doesn't remember the company that supplies her home oxygen. She manages her own medications and denies any issues.  She uses [employer] transportation services.  Home health recommended. Pt without a preference. [facility] is reviewing to see if they can accept.  TOC following.  Expected Discharge Plan: Home w Home Health Services Barriers to Discharge: Continued Medical Work up   Patient Goals and CMS Choice   CMS Medicare.gov Compare Post Acute Care list provided to:: Patient Choice offered to / list presented to : Patient      Expected Discharge Plan and Services     Post Acute Care Choice: Home Health Living arrangements for the past 2 months: Single Family Home                           HH Arranged: PT, Speech Therapy HH Agency: [facility] Date HH Agency Contacted: 09/22/22   Representative spoke with at [facility] Agency: Morrie Sheldon  Prior Living Arrangements/Services Living arrangements for the past 2 months: Single Family Home Lives with:: Adult Children Patient language and need for interpreter reviewed:: Yes Do you feel safe going back to the place where you live?: Yes          Current home services: DME (oxygen/ cane/ walker/ [facility]) Criminal Activity/Legal Involvement Pertinent to Current Situation/Hospitalization: No - Comment as needed  Activities of Daily Living   ADL Screening (condition at time of admission) Patient's cognitive ability adequate to safely complete daily activities?: Yes Is the patient deaf or have difficulty hearing?: No Does the patient have  difficulty seeing, even when wearing glasses/contacts?: No Does the patient have difficulty concentrating, remembering, or making decisions?: Yes Patient able to express need for assistance with ADLs?: Yes Does the patient have difficulty dressing or bathing?: No Independently performs ADLs?: Yes (appropriate for developmental age)  Permission Sought/Granted                  Emotional Assessment Appearance:: Appears stated age Attitude/Demeanor/Rapport: Engaged Affect (typically observed): Accepting Orientation: : Oriented to Self, Oriented to Place, Oriented to  Time, Oriented to Situation   Psych Involvement: No (comment)  Admission diagnosis:  Stroke (cerebrum) (HCC) [I63.9] Middle cerebral artery embolism, 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] Patient Active Problem List   Diagnosis Date Noted   Stroke (cerebrum) (HCC) 09/20/2022   Middle cerebral artery embolism, 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   Hematemesis 12/07/2017   Acute blood loss anemia 12/07/2017   Dementia (HCC) 12/07/2017   CHF (congestive heart failure) (HCC) 12/07/2017   COPD (chronic obstructive pulmonary disease) (HCC) 12/07/2017   History of completed stroke 12/07/2017   Unspecified atrial fibrillation (HCC) 12/07/2017   CKD (chronic kidney disease), stage III (HCC) 12/07/2017   History of diet-controlled diabetes 12/07/2017   Chronic bilateral low back pain without sciatica 10/02/2016   Gastroesophageal reflux disease without esophagitis 08/26/2016   GAD (generalized anxiety disorder) 07/22/2016   Depression 03/19/2016   Insomnia 03/19/2016   Coronary artery disease involving native  coronary artery of native heart without angina pectoris 10/05/2014   Mixed hyperlipidemia 10/05/2014   PCP:  Abner Greenspan, MD Pharmacy:   [facility] - [place], [place] - [address] [place] [address] Phone: [phone] Fax: [phone]  [id number] - [place], [place] - [address] AT [facility] [address] [facility]  [address] & [address] [place] [address] Phone: [phone] Fax: [phone]     Social Determinants of Health (SDOH) Social History: SDOH Screenings   Tobacco Use: Medium Risk (09/22/2022)   SDOH Interventions:     Readmission Risk Interventions     No data to display

## 2022-09-22 NOTE — Progress Notes (Signed)
PCCM Progress Note  Patient presented with acute CVA requiring mechanical thrombectomy, PCCM consulted for medical management including ventilator support.  Patient tolerated extubation 5/19.  Chart review reveals no ongoing critical care needs.PCCM will sign off. Thank you for the opportunity to participate in this patient's care. Please contact if we can be of further assistance.  Saleen Peden D. Harris, NP-C  Pulmonary & Critical Care Personal contact information can be found on Amion  If no contact or response made please call [phone] 09/22/2022, 10:30 AM

## 2022-09-22 NOTE — Progress Notes (Signed)
Patient off the unit to ultrasound.  Melony Overly, RN

## 2022-09-22 NOTE — Progress Notes (Signed)
OT Cancellation Note  Patient Details Name: Melissa [name] MRN: [id number] DOB: [birth year]   Cancelled Treatment:    Reason Eval/Treat Not Completed: Medical issues which prohibited therapy- checked with RN, ordering KI and ultrasound to check femoral site prior to OT. Will follow and see as able.   Barry Brunner, OT Acute Rehabilitation Services Office [phone]   Chancy Milroy 09/22/2022, 8:59 AM

## 2022-09-22 NOTE — Progress Notes (Signed)
VASCULAR LAB    Repeat ultrasound to rule out pseudoaneurysm has been performed.  See CV proc for preliminary results.   Doni Widmer, RVT 09/22/2022, 4:17 PM

## 2022-09-22 NOTE — Progress Notes (Cosign Needed)
Rounding Note    Patient Name: Melissa [name] Date of Encounter: 09/22/2022  [facility] Cardiologist: None   Subjective   Patient reports feeling well today. No chest pain, palpitations, dizziness. Per telemetry, she remains in afib with HR well controlled   Inpatient Medications    Scheduled Meds:  aspirin  81 mg Oral Daily   Or   aspirin  81 mg Per Tube Daily   Chlorhexidine Gluconate Cloth  6 each Topical Daily   docusate  100 mg Per Tube BID   insulin aspart  0-15 Units Subcutaneous Q4H   lisinopril  5 mg Oral Daily   pantoprazole (PROTONIX) IV  40 mg Intravenous Daily   polyethylene glycol  17 g Per Tube Daily   rosuvastatin  40 mg Per Tube Daily   ticagrelor  45 mg Oral BID   Or   ticagrelor  45 mg Per Tube BID   Continuous Infusions:  PRN Meds: acetaminophen **OR** acetaminophen (TYLENOL) oral liquid 160 mg/5 mL **OR** acetaminophen, albuterol, fentaNYL (SUBLIMAZE) injection, hydrALAZINE, midazolam, mouth rinse, senna-docusate   Vital Signs    Vitals:   09/22/22 0500 09/22/22 0600 09/22/22 0700 09/22/22 0800  BP: (!) 160/70 (!) 153/76 (!) 160/58 (!) 169/91  Pulse: 84 63 81 (!) 52  Resp: 19 (!) 26 17 14   Temp:    98.5 F (36.9 C)  TempSrc:    Oral  SpO2: 97% 98% 94% 97%  Weight:      Height:        Intake/Output Summary (Last 24 hours) at 09/22/2022 0918 Last data filed at 09/22/2022 0600 Gross per 24 hour  Intake 549.02 ml  Output 2400 ml  Net -1850.98 ml      09/20/2022    4:52 AM 12/09/2017    6:52 AM 12/07/2017    3:58 AM  Last 3 Weights  Weight (lbs) 162 lb 14.7 oz 138 lb 10.7 oz 140 lb  Weight (kg) 73.9 kg 62.9 kg 63.504 kg      Telemetry    Atrial fibrillation, HR in the 50s-70s - Personally Reviewed  ECG    No new tracings since 5/18 - Personally Reviewed  Physical Exam   GEN: No acute distress.  Laying flat in the bed  Neck: No JVD Cardiac: Irregular rate and rhythm. no murmurs, rubs, or gallops.  Respiratory:  Anterior lung exam clear. Normal work of breathing on Mullens  GI: Soft, nontender, non-distended  MS: No edema in BLE; No deformity. Neuro:  Nonfocal  Psych: Normal affect   Labs    High Sensitivity Troponin:  No results for input(s): "TROPONINIHS" in the last 720 hours.   Chemistry Recent Labs  Lab 09/20/22 0218 09/20/22 0504 09/20/22 0514 09/20/22 0803 09/20/22 1043 09/20/22 1220 09/21/22 0309  NA 137 135   < > 140  --  139 135  K 4.1 3.4*   < > 3.1*  --  3.9 4.6  CL 104 103  --   --   --   --  107  CO2  --  21*  --   --   --   --  19*  GLUCOSE 134* 170*  --   --   --   --  122*  BUN 9 5*  --   --   --   --  14  CREATININE 0.90 0.95  --   --   --   --  1.02*  CALCIUM  --  8.8*  --   --   --   --  8.8*  MG  --   --   --   --  1.8  --  2.1  PROT  --  6.5  --   --   --   --   --   ALBUMIN  --  3.5  --   --   --   --   --   AST  --  18  --   --   --   --   --   ALT  --  15  --   --   --   --   --   ALKPHOS  --  97  --   --   --   --   --   BILITOT  --  0.8  --   --   --   --   --   GFRNONAA  --  >60  --   --   --   --  56*  ANIONGAP  --  11  --   --   --   --  9   < > = values in this interval not displayed.    Lipids  Recent Labs  Lab 09/20/22 0504 09/21/22 0309  CHOL 192  --   TRIG 92 86  HDL 54  --   LDLCALC 120*  --   CHOLHDL 3.6  --     Hematology Recent Labs  Lab 09/20/22 0504 09/20/22 0514 09/20/22 0803 09/20/22 1220 09/21/22 0309  WBC 9.7  --   --   --  7.8  RBC 4.23  --   --   --  3.70*  HGB 10.0*   < > 10.9* 10.2* 8.7*  HCT 32.6*   < > 32.0* 30.0* 28.5*  MCV 77.1*  --   --   --  77.0*  MCH 23.6*  --   --   --  23.5*  MCHC 30.7  --   --   --  30.5  RDW 25.2*  --   --   --  26.0*  PLT 330  --   --   --  290   < > = values in this interval not displayed.   Thyroid No results for input(s): "TSH", "FREET4" in the last 168 hours.  BNPNo results for input(s): "BNP", "PROBNP" in the last 168 hours.  DDimer No results for input(s): "DDIMER" in the last  168 hours.   Radiology    VAS [place] GROIN PSEUDOANEURYSM  Result Date: 09/22/2022  ARTERIAL PSEUDOANEURYSM  Patient Name:  Melissa [name]  Date of Exam:   09/21/2022 Medical Rec #: [id number]         Accession #:    [id number] Date of Birth: June 22, 1945         Patient Gender: F Patient Age:   6 years Exam Location:  [facility] Procedure:      VAS [place] Bobetta Lime Referring Phys: Lawernce Ion --------------------------------------------------------------------------------  Exam: Right groin Indications: Patient complains of palpable knot. History: S/p Lt MCA thrombectomy and stent placement by Dr. Corliss Skains on 5/18. Comparison Study: No prior study Performing Technologist: Sherren Kerns RVS  Examination Guidelines: A complete evaluation includes B-mode imaging, spectral Doppler, color Doppler, and power Doppler as needed of all accessible portions of each vessel. Bilateral testing is considered an integral part of a complete examination. Limited examinations for reoccurring indications may be performed as noted.  Summary: No evidence of pseudoaneurysm, AVF or DVT There is a small hematoma noted  at the stick site.  Diagnosing physician: Sherald Hess MD Electronically signed by Sherald Hess MD on 09/22/2022 at 9:17:05 AM.   --------------------------------------------------------------------------------    Final    ECHOCARDIOGRAM COMPLETE  Result Date: 09/20/2022    ECHOCARDIOGRAM REPORT   Patient Name:   Melissa [name] Date of Exam: 09/20/2022 Medical Rec #:  [id number]        Height:       62.0 in Accession #:    [id number]       Weight:       162.9 lb Date of Birth:  [birth year]        BSA:          1.752 m Patient Age:    77 years         BP:           118/50 mmHg Patient Gender: F                HR:           53 bpm. Exam Location:  Inpatient Procedure: 2D Echo, 3D Echo, Cardiac Doppler and Color Doppler Indications:    Stroke  History:        Patient has prior history of  Echocardiogram examinations, most                 recent 12/08/2017. CAD, Abnormal ECG, Stroke and COPD,                 Arrythmias:Atrial Fibrillation, Signs/Symptoms:Altered Mental                 Status and Alzheimer's; Risk Factors:Dyslipidemia. Pericardial                 effusion.  Sonographer:    Sheralyn Boatman RDCS Referring Phys: [id number] ERIC LINDZEN  Sonographer Comments: Suboptimal apical window. IMPRESSIONS  1. Left ventricular ejection fraction, by estimation, is 60 to 65%. Left ventricular ejection fraction by 3D volume is 64 %. The left ventricle has normal function. The left ventricle has no regional wall motion abnormalities. Left ventricular diastolic  parameters are indeterminate.  2. Right ventricular systolic function is normal. The right ventricular size is normal. Tricuspid regurgitation signal is inadequate for assessing PA pressure.  3. The mitral valve is normal in structure. No evidence of mitral valve regurgitation. No evidence of mitral stenosis.  4. The aortic valve is tricuspid. Aortic valve regurgitation is not visualized. No aortic stenosis is present. FINDINGS  Left Ventricle: Left ventricular ejection fraction, by estimation, is 60 to 65%. Left ventricular ejection fraction by 3D volume is 64 %. The left ventricle has normal function. The left ventricle has no regional wall motion abnormalities. The left ventricular internal cavity size was normal in size. There is no left ventricular hypertrophy. Left ventricular diastolic parameters are indeterminate. Right Ventricle: The right ventricular size is normal. No increase in right ventricular wall thickness. Right ventricular systolic function is normal. Tricuspid regurgitation signal is inadequate for assessing PA pressure. Left Atrium: Left atrial size was normal in size. Right Atrium: Right atrial size was normal in size. Pericardium: Trivial pericardial effusion is present. Mitral Valve: The mitral valve is normal in structure. No evidence  of mitral valve regurgitation. No evidence of mitral valve stenosis. Tricuspid Valve: The tricuspid valve is normal in structure. Tricuspid valve regurgitation is trivial. Aortic Valve: The aortic valve is tricuspid. Aortic valve regurgitation is not visualized. No aortic stenosis is present. Pulmonic Valve: The pulmonic valve was  not well visualized. Pulmonic valve regurgitation is not visualized. Aorta: The aortic root and ascending aorta are structurally normal, with no evidence of dilitation. IAS/Shunts: The interatrial septum was not well visualized.  LEFT VENTRICLE PLAX 2D LVIDd:         4.70 cm LVIDs:         2.70 cm LV PW:         1.10 cm         3D Volume EF LV IVS:        0.80 cm         LV 3D EF:    Left LVOT diam:     2.10 cm                      ventricul LV SV:         83                           ar LV SV Index:   47                           ejection LVOT Area:     3.46 cm                     fraction                                             by 3D                                             volume is                                             64 %.                                 3D Volume EF:                                3D EF:        64 %                                LV EDV:       108 ml                                LV ESV:       38 ml                                LV SV:        70 ml RIGHT VENTRICLE             IVC RV S prime:     10.10  cm/s  IVC diam: 2.40 cm TAPSE (M-mode): 1.1 cm LEFT ATRIUM             Index        RIGHT ATRIUM           Index LA diam:        4.40 cm 2.51 cm/m   RA Area:     15.40 cm LA Vol (A2C):   46.2 ml 26.37 ml/m  RA Volume:   39.10 ml  22.32 ml/m LA Vol (A4C):   39.2 ml 22.37 ml/m LA Biplane Vol: 45.0 ml 25.68 ml/m  AORTIC VALVE LVOT Vmax:   116.00 cm/s LVOT Vmean:  75.200 cm/s LVOT VTI:    0.240 m  AORTA Ao Root diam: 2.90 cm Ao Asc diam:  3.20 cm MITRAL VALVE MV Area (PHT): 2.26 cm     SHUNTS MV Decel Time: 335 msec     Systemic VTI:  0.24 m MV E  velocity: 109.00 cm/s  Systemic Diam: 2.10 cm Epifanio Lesches MD Electronically signed by Epifanio Lesches MD Signature Date/Time: 09/20/2022/3:39:40 PM    Final    DG Abd 1 View  Result Date: 09/20/2022 CLINICAL DATA:  Confirm orogastric tube placement. EXAM: ABDOMEN - 1 VIEW COMPARISON:  12/17/2017 and CT of the abdomen and pelvis on 12/18/2017 FINDINGS: Enteric tube has been placed, tip overlying the level of the proximal stomach. Side port is in the region of the gastroesophageal junction. Course of the tube in the thorax is consistent with hiatal hernia. Visualized bowel gas pattern is nonobstructive. IMPRESSION: Enteric tube tip overlies the level of the proximal stomach. Electronically Signed   By: Norva Pavlov M.D.   On: 09/20/2022 09:37    Cardiac Studies   Echo 09/20/22:  1. Left ventricular ejection fraction, by estimation, is 60 to 65%. Left  ventricular ejection fraction by 3D volume is 64 %. The left ventricle has  normal function. The left ventricle has no regional wall motion  abnormalities. Left ventricular diastolic   parameters are indeterminate.   2. Right ventricular systolic function is normal. The right ventricular  size is normal. Tricuspid regurgitation signal is inadequate for assessing  PA pressure.   3. The mitral valve is normal in structure. No evidence of mitral valve  regurgitation. No evidence of mitral stenosis.   4. The aortic valve is tricuspid. Aortic valve regurgitation is not  visualized. No aortic stenosis is present.   Patient Profile     77 y.o. female with a past medical history of CHF, CVA, atrial fibrillation, dementia, HTN  Assessment & Plan    Chronic atrial fibrillation  - Of note, patient has not been on anticoagulation as an outpatient due to recurrent GI bleeding - Echocardiogram this admission showed EF 60-65%, normal RV systolic function, no significant valvular disease - Per telemetry, patient remains in afib with HR in  the 50s-70s. Not on AV nodal blocking medications  - Given GI bleed history, plan for outpatient referral for watchman. Patient may not be a candidate due to comorbidities and dementia  - Continue ASA, brilinta per neurology.  Acute CVA  - Likely cardioembolic in the setting of Afib, not on [facility] - S/p M1 thrombectomy. Stent placed - Continue brilinta, ASA , crestor   For questions or updates, please contact [facility] Please consult [email or web address] for contact info under        Signed, Jonita Albee, PA-C  09/22/2022, 9:18 AM    Patient seen and  examined with KJ PA-C.  Agree as above, with the following exceptions and changes as noted below. Patient seen 5/21, stable and overall feeling ok. Gen: NAD, CV: iRRR, no murmurs, Lungs: clear, Abd: soft, Extrem: Warm, well perfused, no edema, Neuro/Psych :alert, normal mood and affect. All available labs, radiology testing, previous records reviewed. Eager to go home. Will arrange CV follow up and may consider Watchman eval given no anticoagultion from prior GI bleeding.  Parke Poisson, MD 09/23/22 12:16 PM

## 2022-09-22 NOTE — TOC Benefit Eligibility Note (Signed)
Patient Product/process development scientist completed.    The patient is currently admitted and upon discharge could be taking Brilinta.  The current 30 day co-pay is $0.00.   The patient is insured through [employer]   This test claim was processed through [facility]- copay amounts may vary at other pharmacies due to pharmacy/plan contracts, or as the patient moves through the different stages of their insurance plan.

## 2022-09-22 NOTE — Progress Notes (Signed)
Orthopedic Tech Progress Note Patient Details:  Melissa [name] Jul 30, 1945 [id number]  Ortho Devices Type of Ortho Device: Knee Immobilizer Ortho Device/Splint Location: RLE Ortho Device/Splint Interventions: Ordered, Application, Adjustment   Post Interventions Patient Tolerated: Well Instructions Provided: Care of device  [name] 09/22/2022, 10:35 AM

## 2022-09-23 ENCOUNTER — Inpatient Hospital Stay (HOSPITAL_COMMUNITY): Payer: 59

## 2022-09-23 ENCOUNTER — Encounter (HOSPITAL_COMMUNITY): Payer: Self-pay | Admitting: Neurology

## 2022-09-23 ENCOUNTER — Other Ambulatory Visit: Payer: Self-pay

## 2022-09-23 DIAGNOSIS — I63412 Cerebral infarction due to embolism of left middle cerebral artery: Secondary | ICD-10-CM | POA: Diagnosis not present

## 2022-09-23 LAB — CBC
HCT: 28.9 % — ABNORMAL LOW (ref 36.0–46.0)
Hemoglobin: 9 g/dL — ABNORMAL LOW (ref 12.0–15.0)
MCH: 24.5 pg — ABNORMAL LOW (ref 26.0–34.0)
MCHC: 31.1 g/dL (ref 30.0–36.0)
MCV: 78.7 fL — ABNORMAL LOW (ref 80.0–100.0)
Platelets: 276 K/uL (ref 150–400)
RBC: 3.67 MIL/uL — ABNORMAL LOW (ref 3.87–5.11)
RDW: 25.8 % — ABNORMAL HIGH (ref 11.5–15.5)
WBC: 5.7 K/uL (ref 4.0–10.5)
nRBC: 0 % (ref 0.0–0.2)

## 2022-09-23 LAB — GLUCOSE, CAPILLARY
Glucose-Capillary: 92 mg/dL (ref 70–99)
Glucose-Capillary: 115 mg/dL — ABNORMAL HIGH (ref 70–99)
Glucose-Capillary: 106 mg/dL — ABNORMAL HIGH (ref 70–99)
Glucose-Capillary: 112 mg/dL — ABNORMAL HIGH (ref 70–99)

## 2022-09-23 MED ORDER — APIXABAN 5 MG PO TABS: 5.0000 mg | ORAL_TABLET | Freq: Two times a day (BID) | ORAL | 0 refills | Status: DC

## 2022-09-23 MED ORDER — ROSUVASTATIN CALCIUM 40 MG PO TABS: 40.0000 mg | ORAL_TABLET | Freq: Every day | ORAL | 0 refills | Status: DC

## 2022-09-23 MED ORDER — APIXABAN 5 MG PO TABS
5.0000 mg | ORAL_TABLET | Freq: Two times a day (BID) | ORAL | Status: DC
  Administered 2022-09-23: 5 mg via ORAL
  Filled 2022-09-23: qty 1

## 2022-09-23 MED ORDER — LISINOPRIL 5 MG PO TABS: 5.0000 mg | ORAL_TABLET | Freq: Every day | ORAL | 0 refills | Status: DC

## 2022-09-23 MED ORDER — TICAGRELOR 90 MG PO TABS: 45.0000 mg | ORAL_TABLET | Freq: Two times a day (BID) | ORAL | 0 refills | Status: DC

## 2022-09-23 MED ORDER — DOCUSATE SODIUM 100 MG PO CAPS: 100.0000 mg | ORAL_CAPSULE | Freq: Every day | ORAL | 0 refills | Status: AC

## 2022-09-23 NOTE — Discharge Summary (Addendum)
Stroke Discharge Summary  Patient ID: ranezmae cramblit   MRN: [id number]      DOB: [birth year]  Date of Admission: 09/20/2022 Date of Discharge: 09/23/2022  Attending Physician:  Stroke, Md, MD Patient's PCP:  Abner Greenspan, MD  DISCHARGE DIAGNOSIS:  Acute left MCA ischemic infarct s/p mechanical thrombectomy of left M1 with TICI 3, s/p rescue stent due to reocclusion with return to TICI 3  Principal Problem:   Stroke (cerebrum) [facility]) Active Problems:   Middle cerebral artery embolism, left   Allergies as of 09/23/2022       Reactions   Nsaids Other (See Comments)   CAN TAKE ONLY TYLENOL- NOTHING ELSE!!!!!!   Codeine Nausea And Vomiting, Other (See Comments)   GI Intolerance   Lyrica [pregabalin] Other (See Comments)   Reaction not known at this time   Penicillins Other (See Comments)   Childhood allergy- exact reaction not noted        Medication List     STOP taking these medications    pantoprazole 40 MG tablet Commonly known as: Protonix       TAKE these medications    albuterol 108 (90 Base) MCG/ACT inhaler Commonly known as: VENTOLIN HFA Inhale 2 puffs into the lungs every 6 (six) hours as needed for wheezing or shortness of breath.   apixaban 5 MG Tabs tablet Commonly known as: ELIQUIS Take 1 tablet (5 mg total) by mouth 2 (two) times daily.   docusate sodium 100 MG capsule Commonly known as: COLACE Take 1 capsule (100 mg total) by mouth daily. Start taking on: Sep 24, 2022   ferrous sulfate 325 (65 FE) MG tablet Take 1 tablet (325 mg total) by mouth daily. What changed: when to take this   lisinopril 5 MG tablet Commonly known as: ZESTRIL Take 1 tablet (5 mg total) by mouth daily. Start taking on: Sep 24, 2022   omeprazole 40 MG capsule Commonly known as: PRILOSEC Take 40 mg by mouth daily before breakfast.   rosuvastatin 40 MG tablet Commonly known as: CRESTOR Take 1 tablet (40 mg total) by mouth daily. Start taking on: Sep 24, 2022    ticagrelor 90 MG Tabs tablet Commonly known as: BRILINTA Take 0.5 tablets (45 mg total) by mouth 2 (two) times daily.   TYLENOL 500 MG tablet Generic drug: acetaminophen Take 500-1,000 mg by mouth every 6 (six) hours as needed for mild pain or headache.        LABORATORY STUDIES CBC    Component Value Date/Time   WBC 5.7 09/23/2022 0630   RBC 3.67 (L) 09/23/2022 0630   HGB 9.0 (L) 09/23/2022 0630   HCT 28.9 (L) 09/23/2022 0630   PLT 276 09/23/2022 0630   MCV 78.7 (L) 09/23/2022 0630   MCH 24.5 (L) 09/23/2022 0630   MCHC 31.1 09/23/2022 0630   RDW 25.8 (H) 09/23/2022 0630   LYMPHSABS 0.6 (L) 09/20/2022 0504   MONOABS 0.2 09/20/2022 0504   EOSABS 0.0 09/20/2022 0504   BASOSABS 0.0 09/20/2022 0504   CMP    Component Value Date/Time   NA 135 09/21/2022 0309   K 4.6 09/21/2022 0309   CL 107 09/21/2022 0309   CO2 19 (L) 09/21/2022 0309   GLUCOSE 122 (H) 09/21/2022 0309   BUN 14 09/21/2022 0309   CREATININE 1.02 (H) 09/21/2022 0309   CALCIUM 8.8 (L) 09/21/2022 0309   PROT 6.5 09/20/2022 0504   ALBUMIN 3.5 09/20/2022 0504   AST 18 09/20/2022 0504  ALT 15 09/20/2022 0504   ALKPHOS 97 09/20/2022 0504   BILITOT 0.8 09/20/2022 0504   GFRNONAA 56 (L) 09/21/2022 0309   GFRAA 57 (L) 12/09/2017 0531   COAGS Lab Results  Component Value Date   INR 1.1 09/20/2022   INR 1.23 12/07/2017   INR 2.2 (H) 01/11/2008   Lipid Panel    Component Value Date/Time   CHOL 192 09/20/2022 0504   TRIG 86 09/21/2022 0309   HDL 54 09/20/2022 0504   CHOLHDL 3.6 09/20/2022 0504   VLDL 18 09/20/2022 0504   LDLCALC 120 (H) 09/20/2022 0504   HgbA1C  Lab Results  Component Value Date   HGBA1C 5.5 09/21/2022   Urinalysis    Component Value Date/Time   COLORURINE STRAW (A) 09/20/2022 0504   APPEARANCEUR CLEAR 09/20/2022 0504   LABSPEC >1.046 (H) 09/20/2022 0504   PHURINE 6.0 09/20/2022 0504   GLUCOSEU NEGATIVE 09/20/2022 0504   HGBUR NEGATIVE 09/20/2022 0504   BILIRUBINUR  NEGATIVE 09/20/2022 0504   KETONESUR NEGATIVE 09/20/2022 0504   PROTEINUR 100 (A) 09/20/2022 0504   NITRITE NEGATIVE 09/20/2022 0504   LEUKOCYTESUR NEGATIVE 09/20/2022 0504   Urine Drug Screen No results found for: "LABOPIA", "COCAINSCRNUR", "LABBENZ", "AMPHETMU", "THCU", "LABBARB"  Alcohol Level No results found for: "ETH"   SIGNIFICANT DIAGNOSTIC STUDIES MR BRAIN WO CONTRAST  Result Date: 09/23/2022 CLINICAL DATA:  Stroke, follow-up. EXAM: MRI HEAD WITHOUT CONTRAST TECHNIQUE: Multiplanar, multiecho pulse sequences of the brain and surrounding structures were obtained without intravenous contrast. COMPARISON:  Head CT 09/20/2022. CTA head/neck 09/19/2022. MRI brain 04/08/2013. FINDINGS: Brain: Acute infarcts in the left basal ganglia with scattered small foci of acute infarction in the left temporal and frontal lobes. Additional punctate focus of acute infarction in the left cerebellar hemisphere (image 15 series 2). No acute hemorrhage or significant mass effect. Unchanged moderate chronic small-vessel disease and old lacunar infarcts in the bilateral caudate heads. No hydrocephalus or extra-axial collection. Vascular: Susceptibility artifact from a left MCA stent. Skull and upper cervical spine: Upper cervical spondylosis with at least moderate spinal canal stenosis at C3-4 and C4-5. Sinuses/Orbits: Unremarkable. Other: None. IMPRESSION: 1. Acute infarcts in the left basal ganglia with scattered small foci of acute infarction in the left temporal and frontal lobes. Additional punctate focus of acute infarction in the left cerebellar hemisphere. No acute hemorrhage or significant mass effect. 2. Upper cervical spondylosis with at least moderate spinal canal stenosis at C3-4 and C4-5. Electronically Signed   By: Orvan Falconer M.D.   On: 09/23/2022 11:18   VAS [place] GROIN PSEUDOANEURYSM  Result Date: 09/22/2022  ARTERIAL PSEUDOANEURYSM  Patient Name:  BABARA STEEB  Date of Exam:   09/22/2022  Medical Rec #: [id number]         Accession #:    [id number] Date of Birth: Apr 09, 1946         Patient Gender: F Patient Age:   46 years Exam Location:  [facility] Procedure:      VAS [place] Bobetta Lime Referring Phys: Lannette Donath MATTHEWS --------------------------------------------------------------------------------  Exam: Right groin Indications: Patient complains of groin/proximal thigh pain, bruising and palpable knot. History: S/p Lt MCA thrombectomy and stent placement by Dr. Corliss Skains on. Comparison Study: Prior study done 09/21/2022 Performing Technologist: Sherren Kerns RVS  Examination Guidelines: A complete evaluation includes B-mode imaging, spectral Doppler, color Doppler, and power Doppler as needed of all accessible portions of each vessel. Bilateral testing is considered an integral part of a complete examination. Limited examinations for reoccurring  indications may be performed as noted.  Summary: No evidence of pseudoaneurysm, AVF or DVT Small hematoma at stick site. No change since prior study done 09/21/2022.  Diagnosing physician: Sherald Hess MD Electronically signed by Sherald Hess MD on 09/22/2022 at 4:33:11 PM.   --------------------------------------------------------------------------------    Final    VAS [place] GROIN PSEUDOANEURYSM  Result Date: 09/22/2022  ARTERIAL PSEUDOANEURYSM  Patient Name:  MARYFRANCES EDMOND  Date of Exam:   09/21/2022 Medical Rec #: [id number]         Accession #:    [id number] Date of Birth: [birth year]         Patient Gender: F Patient Age:   80 years Exam Location:  [facility] Procedure:      VAS [place] Bobetta Lime Referring Phys: Lawernce Ion --------------------------------------------------------------------------------  Exam: Right groin Indications: Patient complains of palpable knot. History: S/p Lt MCA thrombectomy and stent placement by Dr. Corliss Skains on 5/18. Comparison Study: No prior study Performing Technologist: Sherren Kerns  RVS  Examination Guidelines: A complete evaluation includes B-mode imaging, spectral Doppler, color Doppler, and power Doppler as needed of all accessible portions of each vessel. Bilateral testing is considered an integral part of a complete examination. Limited examinations for reoccurring indications may be performed as noted.  Summary: No evidence of pseudoaneurysm, AVF or DVT There is a small hematoma noted at the stick site.  Diagnosing physician: Sherald Hess MD Electronically signed by Sherald Hess MD on 09/22/2022 at 9:17:05 AM.   --------------------------------------------------------------------------------    Final    ECHOCARDIOGRAM COMPLETE  Result Date: 09/20/2022    ECHOCARDIOGRAM REPORT   Patient Name:   [name] Date of Exam: 09/20/2022 Medical Rec #:  [id number]        Height:       62.0 in Accession #:    [id number]       Weight:       162.9 lb Date of Birth:  April 05, 1946        BSA:          1.752 m Patient Age:    77 years         BP:           118/50 mmHg Patient Gender: F                HR:           53 bpm. Exam Location:  Inpatient Procedure: 2D Echo, 3D Echo, Cardiac Doppler and Color Doppler Indications:    Stroke  History:        Patient has prior history of Echocardiogram examinations, most                 recent 12/08/2017. CAD, Abnormal ECG, Stroke and COPD,                 Arrythmias:Atrial Fibrillation, Signs/Symptoms:Altered Mental                 Status and Alzheimer's; Risk Factors:Dyslipidemia. Pericardial                 effusion.  Sonographer:    Sheralyn Boatman RDCS Referring Phys: [id number] ERIC LINDZEN  Sonographer Comments: Suboptimal apical window. IMPRESSIONS  1. Left ventricular ejection fraction, by estimation, is 60 to 65%. Left ventricular ejection fraction by 3D volume is 64 %. The left ventricle has normal function. The left ventricle has no regional wall motion abnormalities. Left ventricular diastolic  parameters are indeterminate.  2. Right  ventricular  systolic function is normal. The right ventricular size is normal. Tricuspid regurgitation signal is inadequate for assessing PA pressure.  3. The mitral valve is normal in structure. No evidence of mitral valve regurgitation. No evidence of mitral stenosis.  4. The aortic valve is tricuspid. Aortic valve regurgitation is not visualized. No aortic stenosis is present. FINDINGS  Left Ventricle: Left ventricular ejection fraction, by estimation, is 60 to 65%. Left ventricular ejection fraction by 3D volume is 64 %. The left ventricle has normal function. The left ventricle has no regional wall motion abnormalities. The left ventricular internal cavity size was normal in size. There is no left ventricular hypertrophy. Left ventricular diastolic parameters are indeterminate. Right Ventricle: The right ventricular size is normal. No increase in right ventricular wall thickness. Right ventricular systolic function is normal. Tricuspid regurgitation signal is inadequate for assessing PA pressure. Left Atrium: Left atrial size was normal in size. Right Atrium: Right atrial size was normal in size. Pericardium: Trivial pericardial effusion is present. Mitral Valve: The mitral valve is normal in structure. No evidence of mitral valve regurgitation. No evidence of mitral valve stenosis. Tricuspid Valve: The tricuspid valve is normal in structure. Tricuspid valve regurgitation is trivial. Aortic Valve: The aortic valve is tricuspid. Aortic valve regurgitation is not visualized. No aortic stenosis is present. Pulmonic Valve: The pulmonic valve was not well visualized. Pulmonic valve regurgitation is not visualized. Aorta: The aortic root and ascending aorta are structurally normal, with no evidence of dilitation. IAS/Shunts: The interatrial septum was not well visualized.  LEFT VENTRICLE PLAX 2D LVIDd:         4.70 cm LVIDs:         2.70 cm LV PW:         1.10 cm         3D Volume EF LV IVS:        0.80 cm         LV 3D EF:     Left LVOT diam:     2.10 cm                      ventricul LV SV:         83                           ar LV SV Index:   47                           ejection LVOT Area:     3.46 cm                     fraction                                             by 3D                                             volume is  64 %.                                 3D Volume EF:                                3D EF:        64 %                                LV EDV:       108 ml                                LV ESV:       38 ml                                LV SV:        70 ml RIGHT VENTRICLE             IVC RV S prime:     10.10 cm/s  IVC diam: 2.40 cm TAPSE (M-mode): 1.1 cm LEFT ATRIUM             Index        RIGHT ATRIUM           Index LA diam:        4.40 cm 2.51 cm/m   RA Area:     15.40 cm LA Vol (A2C):   46.2 ml 26.37 ml/m  RA Volume:   39.10 ml  22.32 ml/m LA Vol (A4C):   39.2 ml 22.37 ml/m LA Biplane Vol: 45.0 ml 25.68 ml/m  AORTIC VALVE LVOT Vmax:   116.00 cm/s LVOT Vmean:  75.200 cm/s LVOT VTI:    0.240 m  AORTA Ao Root diam: 2.90 cm Ao Asc diam:  3.20 cm MITRAL VALVE MV Area (PHT): 2.26 cm     SHUNTS MV Decel Time: 335 msec     Systemic VTI:  0.24 m MV E velocity: 109.00 cm/s  Systemic Diam: 2.10 cm Epifanio Lesches MD Electronically signed by Epifanio Lesches MD Signature Date/Time: 09/20/2022/3:39:40 PM    Final    DG Abd 1 View  Result Date: 09/20/2022 CLINICAL DATA:  Confirm orogastric tube placement. EXAM: ABDOMEN - 1 VIEW COMPARISON:  12/17/2017 and CT of the abdomen and pelvis on 12/18/2017 FINDINGS: Enteric tube has been placed, tip overlying the level of the proximal stomach. Side port is in the region of the gastroesophageal junction. Course of the tube in the thorax is consistent with hiatal hernia. Visualized bowel gas pattern is nonobstructive. IMPRESSION: Enteric tube tip overlies the level of the proximal stomach. Electronically Signed    By: Norva Pavlov M.D.   On: 09/20/2022 09:37   CT HEAD WO CONTRAST ( )  Result Date: 09/20/2022 CLINICAL DATA:  77 year old female code stroke presentation yesterday at [facility] left MCA M1 ELVO. Status post endovascular revascularization. EXAM: CT HEAD WITHOUT CONTRAST TECHNIQUE: Contiguous axial images were obtained from the base of the skull through the vertex without intravenous contrast. RADIATION DOSE REDUCTION: This exam was performed according to the departmental dose-optimization program which includes automated exposure control, adjustment of the mA and/or kV according to patient  size and/or use of iterative reconstruction technique. COMPARISON:  Code stroke presentation head CT yesterday. FINDINGS: Brain: Residual intravascular contrast. Chronic left basal ganglia lacunar infarct. Gray-white differentiation in both hemispheres appears stable since yesterday. No definite acute or evolving left MCA infarct by CT. No acute intracranial hemorrhage identified. No midline shift, mass effect, or evidence of intracranial mass lesion. No ventriculomegaly. Vascular: New left MCA M1 segment vascular stent. Calcified atherosclerosis at the skull base. Skull: No acute osseous abnormality identified. Sinuses/Orbits: Intubated now. Visualized paranasal sinuses and mastoids are stable and well aerated. Other: No acute orbit or scalp soft tissue finding. IMPRESSION: 1. New Left MCA vascular stent and some residual intravascular contrast. 2. No definite acute or evolving infarct by CT. No acute intracranial hemorrhage or mass effect. 3. Chronic left basal ganglia lacunar infarct. Electronically Signed   By: Odessa Fleming M.D.   On: 09/20/2022 08:31   DG Chest Port 1 View  Result Date: 09/20/2022 CLINICAL DATA:  [id number], [id number].  Check NGT, ETT. EXAM: PORTABLE CHEST 1 VIEW COMPARISON:  CTA chest, abdomen and pelvis 01/27/2022, chest earlier today at 12:33 a.m. FINDINGS: 6:47 a.m. ETT interval insertion with  the tip 3 cm from the carina. NGT is within a sizable hiatal hernia. Based on the prior CTA chest the sidehole is in the distal thoracic esophagus and should be advanced further in about 10 cm. On that study, just over 1/2 of the stomach was intrathoracic and inverted. The lungs are clear of infiltrates.  No pleural effusion is seen. There is stable moderate to severe cardiomegaly and central vascular fullness without evidence of acute CHF. The aortic arch is heavily calcified.  The mediastinum is stable. Osteopenia and degenerative change thoracic spine. IMPRESSION: 1. ETT interval insertion with the tip 3 cm from the carina. 2. NGT is within a sizable hiatal hernia. Based on the prior CTA chest the sidehole is in the distal thoracic esophagus and should be advanced further in about 10 cm. 3. No acute chest findings.  Stable cardiomegaly. Electronically Signed   By: Almira Bar M.D.   On: 09/20/2022 07:35      HISTORY OF PRESENT ILLNESS Ms. VICTORIOUS WEAKLEY is a 77 y.o. female with history of A-fib not on anticoagulation, hypertension, hyperlipidemia, CHF, diabetes, dementia, recurrent GI bleed, anxiety and depression, COPD, history of Guillain-Barr, migraine history, sleep apnea not on CPAP, prior stroke history, GERD, fibromyalgia who initially presented to [facility] with acute onset of right hemiplegia and aphasia. LKN was 8:00 PM on Friday night. Family heard a thumping sound at 11 PM and went to the room she was in, where apparently she had just fallen. Above deficits were noted and EMS was called. No head trauma was reported. Teleneurology evaluated the patient there and she was determined not to be a TNK candidate due to time criteria. NIHSS was 20. CT head revealed focal hypodensity with ASPECTS of 8 and no hemorrhage. CTA revealed a left M1 occlusion. She was emergently transferred to [facility] for mechanical thrombectomy. Extubated 5/19.    HOSPITAL COURSE Stroke: Acute left MCA  ischemic infarct s/p mechanical thrombectomy of left M1 with TICI 3, s/p rescue stent due to reocclusion with return to TICI 3 Etiology: Cardioembolic on no anticoagulation, and intracranial atherosclerosis CT head at outside hospital with focal hypodensity.  Aspects of 8 CTA head & neck at outside hospital revealed a left M1 occlusion Cerebral angio s/p complete revascularization of left M1 1 with TICI 3, s/p placement  of rescue stent due to reocclusion secondary to underlying intracranial atherosclerosis with return to TICI 3 Post IR CT no hemorrhage Repeat CT head with no acute infarct R groin vasc US: Negative for pseudoaneurysm   MRI acute left basal ganglia and scattered small left temporal and frontal infarcts.  Punctate infarct in left cerebellum.   2D Echo EF 60-65%. LDL 120 HgbA1c 5.5 VTE prophylaxis -SCDs       Diet    Diet Carb Modified Fluid consistency: Thin; Room service appropriate? Yes        No antithrombotics prior to admission, now on on aspirin 81 mg daily and Brilinta 45 mg twice daily.  Will change to Brilinta and Eliquis at discharge. Brilinta d/t new stent. Eliquis d/t chronic Afib.  Therapy recommendations: Home Health PT/OT Disposition: Pending  Dementia Anxiety and depression Fibromyalgia No home medications     Hypertension Chronic A-fib on no anticoagulation CAD Chronic diastolic CHF Home meds: Imdur 30 mg BP goal <160 Long-term BP goal normotensive cardiology recommends watchman device, plan for outpatient referral. Patient may not be candidate d/t comorbidities and dementia.    Hyperlipidemia Home meds: None LDL 120, goal < 70 Con't Crestor 40 mg Continue statin at discharge  COPD Asthma   Diabetes type II Controlled Home meds: None HgbA1c 5.5 goal < 7.0 CBGs Recent Labs (last 2 labs)       Recent Labs    09/23/22 0412 09/23/22 0742 09/23/22 1148  GLUCAP 92 115* 106*      SSI   Dysphagia History of GI bleed secondary  to PUD Hiatal hernia GERD Speech therapy to follow--now on diet Protonix   Other Stroke Risk Factors Advanced Age >/= 45  Hx stroke/TIA CAD Migraines Obstructive sleep apnea, not on CPAP at home Chronic CHF     DISCHARGE EXAM Blood pressure (!) 145/83, pulse 81, temperature (!) 97.5 F (36.4 C), temperature source Oral, resp. rate 17, height 5\' 2"  (1.575 m), weight 73.9 kg, SpO2 99 %. General -NAD Cardiovascular -A-fib on monitor   Mental Status -  Awake, alert. Oriented to name, place but not year/month. Pupils equal round reactive.  Slight right facial droop. No dysarthria. No aphasia. No visual field cut. Follows commands. Tongue midline. Right arm 4/5 with drift. Left arm and b/l legs no drift.  Sensory: intact to LT.  No ataxia.  Gait not examined.  Discharge Diet       Diet   Diet Carb Modified Fluid consistency: Thin; Room service appropriate? Yes   liquids  DISCHARGE PLAN Disposition:  home Brilinta (ticagrelor) 90 mg bid and Eliquis (apixaban) daily for secondary stroke prevention x 6 months due to stent and then aspirin alone Ongoing stroke risk factor control by Primary Care Physician at time of discharge Follow-up PCP Abner Greenspan, MD in 2 weeks. Follow-up in [facility] in 8 weeks, with Dr. Pearlean Brownie office to schedule an appointment.   40 minutes were spent preparing discharge.   Pt seen by Neuro NP/APP and later by MD. Note/plan to be edited by MD as needed.    Lynnae January, DNP, AGACNP-BC [facility] Please use AMION for contact information & EPIC for messaging. I have personally obtained history,examined this patient, reviewed notes, independently viewed imaging studies, participated in medical decision making and plan of care.ROS completed by me personally and pertinent positives fully documented  I have made any additions or clarifications directly to the above note. Agree with note above.  Delia Heady, MD Medical Director [facility] Pager: [phone] 09/23/2022 6:44 PM

## 2022-09-23 NOTE — Progress Notes (Signed)
STROKE TEAM PROGRESS NOTE   INTERVAL HISTORY  No family at the bedside.  RN at the bedside Patient is resting comfortably.  No complaints.  Neurological exam unchanged MRI pending.      Probable discharge today after MRI  Therapy recommending Home Health PT/OT.  Will plan to start Eliquis at discharge and continue Brilinta for stent.  Stop aspirin at discharge  Vitals:   09/23/22 0409 09/23/22 0644 09/23/22 0736 09/23/22 1145  BP: (!) 182/66 (!) 193/94 (!) 158/70 (!) 161/58  Pulse:   85 78  Resp:   15 18  Temp: 98.4 F (36.9 C)  97.7 F (36.5 C) 97.6 F (36.4 C)  TempSrc:   Oral Oral  SpO2:   97% 99%  Weight:      Height:       CBC:  Recent Labs  Lab 09/20/22 0504 09/20/22 0514 09/21/22 0309 09/23/22 0630  WBC 9.7  --  7.8 5.7  NEUTROABS 8.8*  --   --   --   HGB 10.0*   < > 8.7* 9.0*  HCT 32.6*   < > 28.5* 28.9*  MCV 77.1*  --  77.0* 78.7*  PLT 330  --  290 276   < > = values in this interval not displayed.   Basic Metabolic Panel:  Recent Labs  Lab 09/20/22 0504 09/20/22 0514 09/20/22 1043 09/20/22 1220 09/21/22 0309  NA 135   < >  --  139 135  K 3.4*   < >  --  3.9 4.6  CL 103  --   --   --  107  CO2 21*  --   --   --  19*  GLUCOSE 170*  --   --   --  122*  BUN 5*  --   --   --  14  CREATININE 0.95  --   --   --  1.02*  CALCIUM 8.8*  --   --   --  8.8*  MG  --   --  1.8  --  2.1  PHOS  --   --  3.6  --  4.4   < > = values in this interval not displayed.   Lipid Panel:  Recent Labs  Lab 09/20/22 0504 09/21/22 0309  CHOL 192  --   TRIG 92 86  HDL 54  --   CHOLHDL 3.6  --   VLDL 18  --   LDLCALC 120*  --    HgbA1c:  Recent Labs  Lab 09/21/22 0309  HGBA1C 5.5   Urine Drug Screen: No results for input(s): "LABOPIA", "COCAINSCRNUR", "LABBENZ", "AMPHETMU", "THCU", "LABBARB" in the last 168 hours.  Alcohol Level No results for input(s): "ETH" in the last 168 hours.  IMAGING past 24 hours MR BRAIN WO CONTRAST  Result Date:  09/23/2022 CLINICAL DATA:  Stroke, follow-up. EXAM: MRI HEAD WITHOUT CONTRAST TECHNIQUE: Multiplanar, multiecho pulse sequences of the brain and surrounding structures were obtained without intravenous contrast. COMPARISON:  Head CT 09/20/2022. CTA head/neck 09/19/2022. MRI brain 04/08/2013. FINDINGS: Brain: Acute infarcts in the left basal ganglia with scattered small foci of acute infarction in the left temporal and frontal lobes. Additional punctate focus of acute infarction in the left cerebellar hemisphere (image 15 series 2). No acute hemorrhage or significant mass effect. Unchanged moderate chronic small-vessel disease and old lacunar infarcts in the bilateral caudate heads. No hydrocephalus or extra-axial collection. Vascular: Susceptibility artifact from a left MCA stent. Skull and upper cervical spine:  Upper cervical spondylosis with at least moderate spinal canal stenosis at C3-4 and C4-5. Sinuses/Orbits: Unremarkable. Other: None. IMPRESSION: 1. Acute infarcts in the left basal ganglia with scattered small foci of acute infarction in the left temporal and frontal lobes. Additional punctate focus of acute infarction in the left cerebellar hemisphere. No acute hemorrhage or significant mass effect. 2. Upper cervical spondylosis with at least moderate spinal canal stenosis at C3-4 and C4-5. Electronically Signed   By: Orvan Falconer M.D.   On: 09/23/2022 11:18   VAS [place] GROIN PSEUDOANEURYSM  Result Date: 09/22/2022  ARTERIAL PSEUDOANEURYSM  Patient Name:  Melissa [name]  Date of Exam:   09/22/2022 Medical Rec #: [id number]         Accession #:    [id number] Date of Birth: [birth year]         Patient Gender: F Patient Age:   47 years Exam Location:  [facility] Procedure:      VAS [place] Bobetta Lime Referring Phys: Lannette Donath MATTHEWS --------------------------------------------------------------------------------  Exam: Right groin Indications: Patient complains of groin/proximal thigh pain,  bruising and palpable knot. History: S/p Lt MCA thrombectomy and stent placement by Dr. Corliss Skains on. Comparison Study: Prior study done 09/21/2022 Performing Technologist: Sherren Kerns RVS  Examination Guidelines: A complete evaluation includes B-mode imaging, spectral Doppler, color Doppler, and power Doppler as needed of all accessible portions of each vessel. Bilateral testing is considered an integral part of a complete examination. Limited examinations for reoccurring indications may be performed as noted.  Summary: No evidence of pseudoaneurysm, AVF or DVT Small hematoma at stick site. No change since prior study done 09/21/2022.  Diagnosing physician: Sherald Hess MD Electronically signed by Sherald Hess MD on 09/22/2022 at 4:33:11 PM.   --------------------------------------------------------------------------------    Final     PHYSICAL EXAM  Temp:  [97.6 F (36.4 C)-98.4 F (36.9 C)] 97.6 F (36.4 C) (05/21 1145) Pulse Rate:  [64-85] 78 (05/21 1145) Resp:  [15-23] 18 (05/21 1145) BP: (148-193)/(56-94) 161/58 (05/21 1145) SpO2:  [97 %-99 %] 99 % (05/21 1145)  General -NAD Cardiovascular -A-fib on monitor  Mental Status -  Awake, alert. Oriented to name, place but not year/month. Pupils equal round reactive.  Slight right facial droop. No dysarthria. No aphasia. No visual field cut. Follows commands. Tongue midline. Right arm 4/5 with drift. Left arm and b/l legs no drift.  Sensory: intact to LT.  No ataxia.  Gait not examined.   ASSESSMENT/PLAN Melissa [name] is a 77 y.o. female with history of A-fib not on anticoagulation, hypertension, hyperlipidemia, CHF, diabetes, dementia, recurrent GI bleed, anxiety and depression, COPD, history of Guillain-Barr, migraine history, sleep apnea not on CPAP, prior stroke history, GERD, fibromyalgia who initially presented to [facility] with acute onset of right hemiplegia and aphasia. LKN was 8:00 PM on Friday  night. Family heard a thumping sound at 11 PM and went to the room she was in, where apparently she had just fallen. Above deficits were noted and EMS was called. No head trauma was reported. Teleneurology evaluated the patient there and she was determined not to be a TNK candidate due to time criteria. NIHSS was 20. CT head revealed focal hypodensity with ASPECTS of 8 and no hemorrhage. CTA revealed a left M1 occlusion. She was emergently transferred to [facility] for mechanical thrombectomy. Extubated 5/19.   Stroke: Acute left MCA ischemic infarct s/p mechanical thrombectomy of left M1 with TICI 3, s/p rescue stent due to reocclusion with return  to TICI 3 Etiology: Cardioembolic on no anticoagulation, and intracranial atherosclerosis CT head at outside hospital with focal hypodensity.  Aspects of 8 CTA head & neck at outside hospital revealed a left M1 occlusion Cerebral angio s/p complete revascularization of left M1 1 with TICI 3, s/p placement of rescue stent due to reocclusion secondary to underlying intracranial atherosclerosis with return to TICI 3 Post IR CT no hemorrhage Repeat CT head with no acute infarct R groin vasc US: Negative for pseudoaneurysm  MRI acute left basal ganglia and scattered small left temporal and frontal infarcts.  Punctate infarct in left cerebellum.  2D Echo EF 60-65%. LDL 120 HgbA1c 5.5 VTE prophylaxis -SCDs    Diet   Diet Carb Modified Fluid consistency: Thin; Room service appropriate? Yes   No antithrombotics prior to admission, now on on aspirin 81 mg daily and Brilinta 45 mg twice daily.  Will change to Brilinta and Eliquis at discharge. Therapy recommendations: Home Health PT/OT Disposition: Pending  Dementia Anxiety and depression Fibromyalgia No home medications   Hypertension Chronic A-fib on no anticoagulation CAD Chronic diastolic CHF Home meds: Imdur 30 mg BP goal <160 Long-term BP goal normotensive cardiology recommends watchman device,  plan for outpatient referral. Patient may not be candidate d/t comorbidities and dementia.   Hyperlipidemia Home meds: None LDL 120, goal < 70 Con't Crestor 40 mg Continue statin at discharge  COPD Asthma  Diabetes type II Controlled Home meds: None HgbA1c 5.5 goal < 7.0 CBGs Recent Labs    09/23/22 0412 09/23/22 0742 09/23/22 1148  GLUCAP 92 115* 106*    SSI  Dysphagia History of GI bleed secondary to PUD Hiatal hernia GERD Speech therapy to follow--now on diet Protonix  Other Stroke Risk Factors Advanced Age >/= 76  Hx stroke/TIA CAD Migraines Obstructive sleep apnea, not on CPAP at home Chronic CHF  Other Active Problems Chronic anemia-Hgb 10.2 -> 8.7. No signs of bleeding, will check in AM, monitor.  Hypokalemia-K3.4.  Will replace and check in the morning -> 4.6  Hospital day # 3  Recommend DC home with home physical occupational therapy.   Stop aspirin and changed to Brilinta 45 mg twice daily and Eliquis and aggressive risk factor modification.  Likely discharge home today after discussion with family.discussed with Dr. Osa Craver, MD Medical Director [facility] Stroke Center Pager: [phone] 09/23/2022 2:05 PM   To contact Stroke Continuity provider, please refer to [email or web address]. After hours, contact General Neurology

## 2022-09-23 NOTE — Plan of Care (Signed)
   Problem: Education: Goal: Understanding of CV disease, CV risk reduction, and recovery process will improve 09/23/2022 0007 by Cottie Banda, Mardelle Matte, RN Outcome: Not Progressing 09/23/2022 0006 by Cottie Banda, Mardelle Matte, RN Outcome: Not Progressing Goal: Individualized Educational Video(s) 09/23/2022 0007 by Cottie Banda, Mardelle Matte, RN Outcome: Not Progressing 09/23/2022 0006 by Cottie Banda, Mardelle Matte, RN Outcome: Not Progressing   Problem: Activity: Goal: Ability to return to baseline activity level will improve 09/23/2022 0007 by Cottie Banda, Mardelle Matte, RN Outcome: Not Progressing 09/23/2022 0006 by Cottie Banda, Mardelle Matte, RN Outcome: Not Progressing   Problem: Cardiovascular: Goal: Ability to achieve and maintain adequate cardiovascular perfusion will improve 09/23/2022 0007 by Maryjean Morn, RN Outcome: Not Progressing 09/23/2022 0006 by Cottie Banda, Mardelle Matte, RN Outcome: Not Progressing Goal: Vascular access site(s) Level 0-1 will be maintained 09/23/2022 0007 by Maryjean Morn, RN Outcome: Not Progressing 09/23/2022 0006 by Cottie Banda, Mardelle Matte, RN Outcome: Not Progressing

## 2022-09-23 NOTE — TOC Transition Note (Addendum)
Transition of Care [facility]) - CM/SW Discharge Note   Patient Details  Name: Melissa [name] MRN: [id number] Date of Birth: 10/10/1945  Transition of [facility] CM/SW Contact:  Kermit Balo, RN Phone Number: 09/23/2022, 4:09 PM   Clinical Narrative:    Pt is discharging home with home health services through [facility]. Information on the AVS.  Pt requires transportation home. CM has called her [employer] dual complete. They have arranged transport via [employer] with an [place]. [phone] CM provide pt with 30 day free coupons for eliquis and brilinta. CM has updated MD and bedside RN.  CM will call and update pts son.    Final next level of care: Home w Home Health Services Barriers to Discharge: No Barriers Identified   Patient Goals and CMS Choice CMS Medicare.gov Compare Post Acute Care list provided to:: Patient Choice offered to / list presented to : Patient  Discharge Placement                         Discharge Plan and Services Additional resources added to the After Visit Summary for       Post Acute Care Choice: Home Health                    HH Arranged: OT [facility] Agency: [facility] Date [facility] Agency Contacted: 09/22/22   Representative spoke with at [facility] Agency: Morrie Sheldon  Social Determinants of Health (SDOH) Interventions SDOH Screenings   Food Insecurity: No Food Insecurity (09/22/2022)  Housing: Low Risk  (09/22/2022)  Transportation Needs: No Transportation Needs (09/22/2022)  Utilities: Not At Risk (09/22/2022)  Tobacco Use: Medium Risk (09/23/2022)     Readmission Risk Interventions     No data to display

## 2022-09-23 NOTE — Progress Notes (Signed)
Referring Physician(s): Dr. Caryl Pina  Supervising Physician: Julieanne Cotton  Patient Status:  [facility] - In-pt  Chief Complaint: L MCA occlusion  Subjective: Patient sitting up in chair this am. States has been up walking around with therapy. Oriented to self and place.     Allergies: Nsaids, Codeine, Lyrica [pregabalin], and Penicillins  Medications:  Current Facility-Administered Medications:    acetaminophen (TYLENOL) tablet 650 mg, 650 mg, Oral, Q4H PRN, 650 mg at 09/23/22 0644 **OR** acetaminophen (TYLENOL) 160 MG/5ML solution 650 mg, 650 mg, Per Tube, Q4H PRN **OR** acetaminophen (TYLENOL) suppository 650 mg, 650 mg, Rectal, Q4H PRN, Caryl Pina, MD   albuterol (PROVENTIL) (2.5 MG/3ML) 0.083% nebulizer solution 2.5 mg, 2.5 mg, Nebulization, Q4H PRN, Coralyn Helling, MD   aspirin chewable tablet 81 mg, 81 mg, Oral, Daily, 81 mg at 09/22/22 1031 **OR** aspirin chewable tablet 81 mg, 81 mg, Per Tube, Daily, Deveshwar, Sanjeev, MD   Chlorhexidine Gluconate Cloth 2 % PADS 6 each, 6 each, Topical, Daily, Coralyn Helling, MD, 6 each at 09/22/22 1037   docusate sodium (COLACE) capsule 100 mg, 100 mg, Oral, Daily, Richardo Priest, Erin C, NP   hydrALAZINE (APRESOLINE) injection 10-20 mg, 10-20 mg, Intravenous, Q6H PRN, Hetty Blend C, NP, 20 mg at 09/23/22 0644   insulin aspart (novoLOG) injection 0-15 Units, 0-15 Units, Subcutaneous, Q4H, Coralyn Helling, MD, 2 Units at 09/21/22 2059   lisinopril (ZESTRIL) tablet 5 mg, 5 mg, Oral, Daily, Caryl Pina, MD, 5 mg at 09/22/22 1036   Oral care mouth rinse, 15 mL, Mouth Rinse, PRN, Craige Cotta, Vineet, MD   pantoprazole (PROTONIX) EC tablet 40 mg, 40 mg, Oral, Daily, Palikh, Gaurang M, MD   polyethylene glycol (MIRALAX / GLYCOLAX) packet 17 g, 17 g, Oral, Daily, Richardo Priest, Erin C, NP   rosuvastatin (CRESTOR) tablet 40 mg, 40 mg, Oral, Daily, Richardo Priest, Erin C, NP   senna-docusate (Senokot-S) tablet 1 tablet, 1 tablet, Oral, QHS PRN, Caryl Pina, MD    ticagrelor (BRILINTA) tablet 45 mg, 45 mg, Oral, BID, 45 mg at 09/22/22 2005 **OR** ticagrelor (BRILINTA) tablet 45 mg, 45 mg, Per Tube, BID, Ashley Royalty, Senaida Ores, PA    Vital Signs: BP (!) 158/70 (BP Location: Left Arm)   Pulse 85   Temp 97.7 F (36.5 C) (Oral)   Resp 15   Ht 5\' 2"  (1.575 m)   Wt 162 lb 14.7 oz (73.9 kg)   SpO2 97%   BMI 29.80 kg/m   Physical Exam NAD, alert.  Oriented x3 Neuro: EOMs intact, facial symmetry, moving all extremities.  Follows all commands Skin: (R)groin ecchymosis with minimal spread since yesterday, following dependent distribution. Still tender but less than yesterday. Pulses: intact DP  Imaging: VAS [place] GROIN PSEUDOANEURYSM  Result Date: 09/22/2022  ARTERIAL PSEUDOANEURYSM  Patient Name:  Melissa [name]  Date of Exam:   09/22/2022 Medical Rec #: [id number]         Accession #:    [id number] Date of Birth: 1946-05-04         Patient Gender: F Patient Age:   30 years Exam Location:  [facility] Procedure:      VAS [place] Bobetta Lime Referring Phys: Lannette Donath MATTHEWS --------------------------------------------------------------------------------  Exam: Right groin Indications: Patient complains of groin/proximal thigh pain, bruising and palpable knot. History: S/p Lt MCA thrombectomy and stent placement by Dr. Corliss Skains on. Comparison Study: Prior study done 09/21/2022 Performing Technologist: Sherren Kerns RVS  Examination Guidelines: A complete evaluation includes B-mode imaging, spectral Doppler, color Doppler, and  power Doppler as needed of all accessible portions of each vessel. Bilateral testing is considered an integral part of a complete examination. Limited examinations for reoccurring indications may be performed as noted.  Summary: No evidence of pseudoaneurysm, AVF or DVT Small hematoma at stick site. No change since prior study done 09/21/2022.  Diagnosing physician: Sherald Hess MD Electronically signed by Sherald Hess MD  on 09/22/2022 at 4:33:11 PM.   --------------------------------------------------------------------------------    Final    VAS [place] GROIN PSEUDOANEURYSM  Result Date: 09/22/2022  ARTERIAL PSEUDOANEURYSM  Patient Name:  Melissa [name]  Date of Exam:   09/21/2022 Medical Rec #: [id number]         Accession #:    [id number] Date of Birth: June 06, 1945         Patient Gender: F Patient Age:   26 years Exam Location:  [facility] Procedure:      VAS [place] Bobetta Lime Referring Phys: Lawernce Ion --------------------------------------------------------------------------------  Exam: Right groin Indications: Patient complains of palpable knot. History: S/p Lt MCA thrombectomy and stent placement by Dr. Corliss Skains on 5/18. Comparison Study: No prior study Performing Technologist: Sherren Kerns RVS  Examination Guidelines: A complete evaluation includes B-mode imaging, spectral Doppler, color Doppler, and power Doppler as needed of all accessible portions of each vessel. Bilateral testing is considered an integral part of a complete examination. Limited examinations for reoccurring indications may be performed as noted.  Summary: No evidence of pseudoaneurysm, AVF or DVT There is a small hematoma noted at the stick site.  Diagnosing physician: Sherald Hess MD Electronically signed by Sherald Hess MD on 09/22/2022 at 9:17:05 AM.   --------------------------------------------------------------------------------    Final    ECHOCARDIOGRAM COMPLETE  Result Date: 09/20/2022    ECHOCARDIOGRAM REPORT   Patient Name:   Melissa [name] Date of Exam: 09/20/2022 Medical Rec #:  [id number]        Height:       62.0 in Accession #:    [id number]       Weight:       162.9 lb Date of Birth:  1946/04/04        BSA:          1.752 m Patient Age:    77 years         BP:           118/50 mmHg Patient Gender: F                HR:           53 bpm. Exam Location:  Inpatient Procedure: 2D Echo, 3D Echo, Cardiac Doppler and  Color Doppler Indications:    Stroke  History:        Patient has prior history of Echocardiogram examinations, most                 recent 12/08/2017. CAD, Abnormal ECG, Stroke and COPD,                 Arrythmias:Atrial Fibrillation, Signs/Symptoms:Altered Mental                 Status and Alzheimer's; Risk Factors:Dyslipidemia. Pericardial                 effusion.  Sonographer:    Sheralyn Boatman RDCS Referring Phys: [id number] ERIC LINDZEN  Sonographer Comments: Suboptimal apical window. IMPRESSIONS  1. Left ventricular ejection fraction, by estimation, is 60 to 65%. Left ventricular ejection fraction by 3D volume is  64 %. The left ventricle has normal function. The left ventricle has no regional wall motion abnormalities. Left ventricular diastolic  parameters are indeterminate.  2. Right ventricular systolic function is normal. The right ventricular size is normal. Tricuspid regurgitation signal is inadequate for assessing PA pressure.  3. The mitral valve is normal in structure. No evidence of mitral valve regurgitation. No evidence of mitral stenosis.  4. The aortic valve is tricuspid. Aortic valve regurgitation is not visualized. No aortic stenosis is present. FINDINGS  Left Ventricle: Left ventricular ejection fraction, by estimation, is 60 to 65%. Left ventricular ejection fraction by 3D volume is 64 %. The left ventricle has normal function. The left ventricle has no regional wall motion abnormalities. The left ventricular internal cavity size was normal in size. There is no left ventricular hypertrophy. Left ventricular diastolic parameters are indeterminate. Right Ventricle: The right ventricular size is normal. No increase in right ventricular wall thickness. Right ventricular systolic function is normal. Tricuspid regurgitation signal is inadequate for assessing PA pressure. Left Atrium: Left atrial size was normal in size. Right Atrium: Right atrial size was normal in size. Pericardium: Trivial pericardial  effusion is present. Mitral Valve: The mitral valve is normal in structure. No evidence of mitral valve regurgitation. No evidence of mitral valve stenosis. Tricuspid Valve: The tricuspid valve is normal in structure. Tricuspid valve regurgitation is trivial. Aortic Valve: The aortic valve is tricuspid. Aortic valve regurgitation is not visualized. No aortic stenosis is present. Pulmonic Valve: The pulmonic valve was not well visualized. Pulmonic valve regurgitation is not visualized. Aorta: The aortic root and ascending aorta are structurally normal, with no evidence of dilitation. IAS/Shunts: The interatrial septum was not well visualized.  LEFT VENTRICLE PLAX 2D LVIDd:         4.70 cm LVIDs:         2.70 cm LV PW:         1.10 cm         3D Volume EF LV IVS:        0.80 cm         LV 3D EF:    Left LVOT diam:     2.10 cm                      ventricul LV SV:         83                           ar LV SV Index:   47                           ejection LVOT Area:     3.46 cm                     fraction                                             by 3D                                             volume is  64 %.                                 3D Volume EF:                                3D EF:        64 %                                LV EDV:       108 ml                                LV ESV:       38 ml                                LV SV:        70 ml RIGHT VENTRICLE             IVC RV S prime:     10.10 cm/s  IVC diam: 2.40 cm TAPSE (M-mode): 1.1 cm LEFT ATRIUM             Index        RIGHT ATRIUM           Index LA diam:        4.40 cm 2.51 cm/m   RA Area:     15.40 cm LA Vol (A2C):   46.2 ml 26.37 ml/m  RA Volume:   39.10 ml  22.32 ml/m LA Vol (A4C):   39.2 ml 22.37 ml/m LA Biplane Vol: 45.0 ml 25.68 ml/m  AORTIC VALVE LVOT Vmax:   116.00 cm/s LVOT Vmean:  75.200 cm/s LVOT VTI:    0.240 m  AORTA Ao Root diam: 2.90 cm Ao Asc diam:  3.20 cm MITRAL VALVE MV  Area (PHT): 2.26 cm     SHUNTS MV Decel Time: 335 msec     Systemic VTI:  0.24 m MV E velocity: 109.00 cm/s  Systemic Diam: 2.10 cm Epifanio Lesches MD Electronically signed by Epifanio Lesches MD Signature Date/Time: 09/20/2022/3:39:40 PM    Final    DG Abd 1 View  Result Date: 09/20/2022 CLINICAL DATA:  Confirm orogastric tube placement. EXAM: ABDOMEN - 1 VIEW COMPARISON:  12/17/2017 and CT of the abdomen and pelvis on 12/18/2017 FINDINGS: Enteric tube has been placed, tip overlying the level of the proximal stomach. Side port is in the region of the gastroesophageal junction. Course of the tube in the thorax is consistent with hiatal hernia. Visualized bowel gas pattern is nonobstructive. IMPRESSION: Enteric tube tip overlies the level of the proximal stomach. Electronically Signed   By: Norva Pavlov M.D.   On: 09/20/2022 09:37   CT HEAD WO CONTRAST ( )  Result Date: 09/20/2022 CLINICAL DATA:  77 year old female code stroke presentation yesterday at [facility] left MCA M1 ELVO. Status post endovascular revascularization. EXAM: CT HEAD WITHOUT CONTRAST TECHNIQUE: Contiguous axial images were obtained from the base of the skull through the vertex without intravenous contrast. RADIATION DOSE REDUCTION: This exam was performed according to the departmental dose-optimization program which includes automated exposure control, adjustment of the mA and/or kV according to patient  size and/or use of iterative reconstruction technique. COMPARISON:  Code stroke presentation head CT yesterday. FINDINGS: Brain: Residual intravascular contrast. Chronic left basal ganglia lacunar infarct. Gray-white differentiation in both hemispheres appears stable since yesterday. No definite acute or evolving left MCA infarct by CT. No acute intracranial hemorrhage identified. No midline shift, mass effect, or evidence of intracranial mass lesion. No ventriculomegaly. Vascular: New left MCA M1 segment vascular  stent. Calcified atherosclerosis at the skull base. Skull: No acute osseous abnormality identified. Sinuses/Orbits: Intubated now. Visualized paranasal sinuses and mastoids are stable and well aerated. Other: No acute orbit or scalp soft tissue finding. IMPRESSION: 1. New Left MCA vascular stent and some residual intravascular contrast. 2. No definite acute or evolving infarct by CT. No acute intracranial hemorrhage or mass effect. 3. Chronic left basal ganglia lacunar infarct. Electronically Signed   By: Odessa Fleming M.D.   On: 09/20/2022 08:31   DG Chest Port 1 View  Result Date: 09/20/2022 CLINICAL DATA:  [id number], [id number].  Check NGT, ETT. EXAM: PORTABLE CHEST 1 VIEW COMPARISON:  CTA chest, abdomen and pelvis 01/27/2022, chest earlier today at 12:33 a.m. FINDINGS: 6:47 a.m. ETT interval insertion with the tip 3 cm from the carina. NGT is within a sizable hiatal hernia. Based on the prior CTA chest the sidehole is in the distal thoracic esophagus and should be advanced further in about 10 cm. On that study, just over 1/2 of the stomach was intrathoracic and inverted. The lungs are clear of infiltrates.  No pleural effusion is seen. There is stable moderate to severe cardiomegaly and central vascular fullness without evidence of acute CHF. The aortic arch is heavily calcified.  The mediastinum is stable. Osteopenia and degenerative change thoracic spine. IMPRESSION: 1. ETT interval insertion with the tip 3 cm from the carina. 2. NGT is within a sizable hiatal hernia. Based on the prior CTA chest the sidehole is in the distal thoracic esophagus and should be advanced further in about 10 cm. 3. No acute chest findings.  Stable cardiomegaly. Electronically Signed   By: Almira Bar M.D.   On: 09/20/2022 07:35    Labs:  CBC: Recent Labs    09/20/22 0504 09/20/22 0514 09/20/22 0803 09/20/22 1220 09/21/22 0309 09/23/22 0630  WBC 9.7  --   --   --  7.8 5.7  HGB 10.0*   < > 10.9* 10.2* 8.7* 9.0*  HCT 32.6*    < > 32.0* 30.0* 28.5* 28.9*  PLT 330  --   --   --  290 276   < > = values in this interval not displayed.     COAGS: Recent Labs    09/20/22 0504  INR 1.1  APTT 78*     BMP: Recent Labs    09/20/22 0218 09/20/22 0504 09/20/22 0514 09/20/22 0803 09/20/22 1220 09/21/22 0309  NA 137 135 138 140 139 135  K 4.1 3.4* 3.6 3.1* 3.9 4.6  CL 104 103  --   --   --  107  CO2  --  21*  --   --   --  19*  GLUCOSE 134* 170*  --   --   --  122*  BUN 9 5*  --   --   --  14  CALCIUM  --  8.8*  --   --   --  8.8*  CREATININE 0.90 0.95  --   --   --  1.02*  GFRNONAA  --  >60  --   --   --  56*     LIVER FUNCTION TESTS: Recent Labs    09/20/22 0504  BILITOT 0.8  AST 18  ALT 15  ALKPHOS 97  PROT 6.5  ALBUMIN 3.5     Assessment and Plan: 77 y.o. 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 Corliss Skains on 5/18.  Patient doing well neurologically this AM.  R groin site remains exquisitely tender, possible lateral extensive of bruising.  Repeat Vas US showed NO pseudoaneurysm and hematoma unchanged. Ok ay to cont OOB with therapy. Will need to see Dr. Corliss Skains in follow up about 2 weeks, IR team will, coordinate that with pt. Cont Brilinta dosing by half to 45mg  BID, continue aspirin 81mg  daily.  IR following.   Electronically Signed: Brayton El, PA-C 09/23/2022, 8:45 AM   I spent a total of 25 Minutes at the the patient's bedside AND on the patient's hospital floor or unit, greater than 50% of which was counseling/coordinating care for L MCA occlusion.

## 2022-09-23 NOTE — Discharge Instructions (Signed)

## 2022-09-23 NOTE — Progress Notes (Signed)
Physical Therapy Treatment Patient Details Name: Melissa [name] MRN: [id number] DOB: Oct 07, 1945 Today's Date: 09/23/2022   History of Present Illness 77 y.o. female presents to [facility] hospital on 09/20/2022 with R weakness, slurred speech, L gaze. 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 Pt underwent thrombectomy and stenting on 5/18,. MH includes anxiety, OA, asthma, COPD, CHF, depression, dementia, DMII, GBS, HLD, HTN, OSA, CVA, CAD, afib.    PT Comments    Pt received soiled without awareness; typically wears Depends at home. Assisted pt to sink for peri care and gown change. Pt ambulating hallway distances with no assistive device and negotiated stairs with min guard assist. Will benefit from HHPT at d/c for further balance and strengthening.    Recommendations for follow up therapy are one component of a multi-disciplinary discharge planning process, led by the attending physician.  Recommendations may be updated based on patient status, additional functional criteria and insurance authorization.  Follow Up Recommendations       Assistance Recommended at Discharge Frequent or constant Supervision/Assistance  Patient can return home with the following A little help with walking and/or transfers;A little help with bathing/dressing/bathroom;Assistance with cooking/housework;Help with stairs or ramp for entrance   Equipment Recommendations  None recommended by PT    Recommendations for Other Services       Precautions / Restrictions Precautions Precautions: Fall;Other (comment) Precaution Comments: monitor O2 (pt reports wearing supplemental O2 "sometimes") Restrictions Weight Bearing Restrictions: No     Mobility  Bed Mobility Overal bed mobility: Modified Independent                  Transfers Overall transfer level: Needs assistance Equipment used: None Transfers: Sit to/from Stand Sit to Stand: Min assist           General transfer comment: MinA to power up from  lower toilet surface    Ambulation/Gait Ambulation/Gait assistance: Min guard Gait Distance (Feet): 100 Feet Assistive device: None Gait Pattern/deviations: Step-through pattern, Decreased stride length Gait velocity: decreased     General Gait Details: Min guard for safety, no overt LOB   Stairs Stairs: Yes Stairs assistance: Min guard Stair Management: Two rails Number of Stairs: 5     Wheelchair Mobility    Modified Rankin (Stroke Patients Only) Modified Rankin (Stroke Patients Only) Pre-Morbid Rankin Score: Moderate disability Modified Rankin: Moderately severe disability     Balance Overall balance assessment: Needs assistance Sitting-balance support: No upper extremity supported Sitting balance-Leahy Scale: Good     Standing balance support: No upper extremity supported, Bilateral upper extremity supported Standing balance-Leahy Scale: Fair                              Cognition Arousal/Alertness: Awake/alert Behavior During Therapy: WFL for tasks assessed/performed Overall Cognitive Status: No family/caregiver present to determine baseline cognitive functioning                                 General Comments: Pleasant, follows commands though decreased overall awareness of thrombectomy, situation that led to admission, etc        Exercises      General Comments General comments (skin integrity, edema, etc.): SpO2 97% on RA      Pertinent Vitals/Pain Pain Assessment Pain Assessment: Faces Faces Pain Scale: No hurt    Home Living  Prior Function            PT Goals (current goals can now be found in the care plan section) Acute Rehab PT Goals Potential to Achieve Goals: Good Progress towards PT goals: Progressing toward goals    Frequency    Min 4X/week      PT Plan Current plan remains appropriate    Co-evaluation              AM-PAC PT "6 Clicks" Mobility    Outcome Measure  Help needed turning from your back to your side while in a flat bed without using bedrails?: None Help needed moving from lying on your back to sitting on the side of a flat bed without using bedrails?: None Help needed moving to and from a bed to a chair (including a wheelchair)?: A Little Help needed standing up from a chair using your arms (e.g., wheelchair or bedside chair)?: A Little Help needed to walk in hospital room?: A Little Help needed climbing 3-5 steps with a railing? : A Little 6 Click Score: 20    End of Session Equipment Utilized During Treatment: Gait belt Activity Tolerance: Patient tolerated treatment well Patient left: with chair alarm set;in chair;with call bell/phone within reach Nurse Communication: Mobility status PT Visit Diagnosis: Other abnormalities of gait and mobility (R26.89);Muscle weakness (generalized) (M62.81)     Time: 8657-8469 PT Time Calculation (min) (ACUTE ONLY): 24 min  Charges:  $Therapeutic Activity: 23-37 mins                     Lillia Pauls, PT, DPT Acute Rehabilitation Services Office [phone]    Norval Morton 09/23/2022, 2:23 PM

## 2022-09-23 NOTE — Care Management Important Message (Signed)
Important Message  Patient Details  Name: Melissa [name] MRN: [id number] Date of Birth: October 08, 1945   Medicare Important Message Given:  Yes     Dorena Bodo 09/23/2022, 2:42 PM

## 2022-09-23 NOTE — Evaluation (Signed)
Occupational Therapy Evaluation Patient Details Name: Melissa [name] MRN: [id number] DOB: 07-27-1945 Today's Date: 09/23/2022   History of Present Illness 77 y.o. female presents to [facility] hospital on 09/20/2022 with R weakness, slurred speech, L gaze. 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 Pt underwent thrombectomy and stenting on 5/18,. MH includes anxiety, OA, asthma, COPD, CHF, depression, dementia, DMII, GBS, HLD, HTN, OSA, CVA, CAD, afib.   Clinical Impression   PTA, pt lives with son, typically ambulatory with a cane and Modified Independent with ADLs. Pt presents now with minor deficits in strength, dynamic standing balance, endurance and cognition. Pt able to mobilize without AD vs handheld assist w/ min guard. Pt requires Setup for UB ADL and min guard for LB ADLs d/t deficits. Recommend HHOT follow up at DC.      Recommendations for follow up therapy are one component of a multi-disciplinary discharge planning process, led by the attending physician.  Recommendations may be updated based on patient status, additional functional criteria and insurance authorization.   Assistance Recommended at Discharge Intermittent Supervision/Assistance  Patient can return home with the following A little help with bathing/dressing/bathroom;Assistance with cooking/housework;Direct supervision/assist for financial management;Direct supervision/assist for medications management;Assist for transportation    Functional Status Assessment  Patient has had a recent decline in their functional status and demonstrates the ability to make significant improvements in function in a reasonable and predictable amount of time.  Equipment Recommendations  None recommended by OT    Recommendations for Other Services       Precautions / Restrictions Precautions Precautions: Fall;Other (comment) Precaution Comments: monitor O2 (pt reports wearing supplemental O2 "sometimes") Restrictions Weight Bearing Restrictions:  No      Mobility Bed Mobility Overal bed mobility: Needs Assistance Bed Mobility: Supine to Sit     Supine to sit: Supervision, HOB elevated          Transfers Overall transfer level: Needs assistance Equipment used: Rolling walker (2 wheels) Transfers: Sit to/from Stand Sit to Stand: Min guard           General transfer comment: no device to stand      Balance Overall balance assessment: Needs assistance Sitting-balance support: No upper extremity supported Sitting balance-Leahy Scale: Good     Standing balance support: No upper extremity supported, Bilateral upper extremity supported Standing balance-Leahy Scale: Fair                             ADL either performed or assessed with clinical judgement   ADL Overall ADL's : Needs assistance/impaired Eating/Feeding: Modified independent;Sitting   Grooming: Min guard;Standing;Wash/dry face;Brushing hair   Upper Body Bathing: Set up;Sitting   Lower Body Bathing: Min guard;Sit to/from stand   Upper Body Dressing : Set up;Sitting   Lower Body Dressing: Min guard;Sit to/from stand;Sitting/lateral leans   Toilet Transfer: Min guard;Ambulation   Toileting- Clothing Manipulation and Hygiene: Min guard;Sitting/lateral lean;Sit to/from stand       Functional mobility during ADLs: Min guard General ADL Comments: minor unsteadiness with mobility initially but improved with activity     Vision Baseline Vision/History: 1 Wears glasses Ability to See in Adequate Light: 0 Adequate Patient Visual Report: No change from baseline Vision Assessment?: No apparent visual deficits     Perception     Praxis      Pertinent Vitals/Pain Pain Assessment Pain Assessment: Faces Faces Pain Scale: Hurts a little bit Pain Location: "hip" Pain Descriptors / Indicators: Sore Pain  Intervention(s): Monitored during session     Hand Dominance Right   Extremity/Trunk Assessment Upper Extremity  Assessment Upper Extremity Assessment: LUE deficits/detail LUE Deficits / Details: 4th digit trigger finger; denies pain and reports doctors told her there was nothing they could do. slow dysdiadochokinesia   Lower Extremity Assessment Lower Extremity Assessment: Defer to PT evaluation   Cervical / Trunk Assessment Cervical / Trunk Assessment: Kyphotic   Communication Communication Communication: No difficulties   Cognition Arousal/Alertness: Awake/alert Behavior During Therapy: WFL for tasks assessed/performed Overall Cognitive Status: No family/caregiver present to determine baseline cognitive functioning                                 General Comments: able to report May but unaware of year - pt reports she typically is aware of the year. Pleasant, follows commands though decreased overall awareness of thrombectomy, situation that led to admission, etc     General Comments  SpO2 > 97% on RA throughout    Exercises     Shoulder Instructions      Home Living Family/patient expects to be discharged to:: Private residence Living Arrangements: Children Available Help at Discharge: Family;Available 24 hours/day Type of Home: House Home Access: Stairs to enter [employer] of Steps: 5 Entrance Stairs-Rails: Right;Left Home Layout: One level     Bathroom Shower/Tub: Chief Strategy Officer: Standard     Home Equipment: Agricultural consultant (2 wheels);Cane - single point;Wheelchair - Sport and exercise psychologist Comments: Lives with son who has epiliepsy but can provide 24 hr physical support.  Neither drive.      Prior Functioning/Environment Prior Level of Function : Needs assist             Mobility Comments: Ambulates with cane - could ambulate in community ADLs Comments: Pt performs ADLs; shares IADLs with son. with prior admission, pt reports neither her nor son drive but today, pt reports that she drives        OT Problem  List: Decreased strength;Decreased activity tolerance;Impaired balance (sitting and/or standing);Decreased cognition;Decreased knowledge of use of DME or AE      OT Treatment/Interventions: Self-care/ADL training;Therapeutic exercise;Energy conservation;DME and/or AE instruction;Therapeutic activities    OT Goals(Current goals can be found in the care plan section) Acute Rehab OT Goals Patient Stated Goal: home soon OT Goal Formulation: With patient Time For Goal Achievement: 10/07/22 Potential to Achieve Goals: Good  OT Frequency: Min 2X/week    Co-evaluation              AM-PAC OT "6 Clicks" Daily Activity     Outcome Measure Help from another person eating meals?: None Help from another person taking care of personal grooming?: A Little Help from another person toileting, which includes using toliet, bedpan, or urinal?: A Little Help from another person bathing (including washing, rinsing, drying)?: A Little Help from another person to put on and taking off regular upper body clothing?: A Little Help from another person to put on and taking off regular lower body clothing?: A Little 6 Click Score: 19   End of Session Equipment Utilized During Treatment: Gait belt Nurse Communication: Mobility status  Activity Tolerance: Patient tolerated treatment well Patient left: in chair;with call bell/phone within reach;with chair alarm set  OT Visit Diagnosis: Unsteadiness on feet (R26.81);Other abnormalities of gait and mobility (R26.89);Muscle weakness (generalized) (M62.81)  Time: 1610-9604 OT Time Calculation (min): 31 min Charges:  OT General Charges $OT Visit: 1 Visit OT Evaluation $OT Eval Moderate Complexity: 1 Mod OT Treatments $Self Care/Home Management : 8-22 mins  Bradd Canary, OTR/L Acute Rehab Services Office: [phone]   Lorre Munroe 09/23/2022, 8:54 AM

## 2022-09-23 NOTE — Progress Notes (Signed)
PATIENT IV REMOVED AND TELEMETRY DISCONTINUED. BELONGINGS PACKED AND SENT WITH PATIENT. PATIENT WHEELED OFF OF UNIT FOR TRANSPORT HOME.  Melony Overly, RN

## 2022-10-07 ENCOUNTER — Ambulatory Visit: Payer: 59 | Attending: Physician Assistant | Admitting: Physician Assistant

## 2022-10-08 ENCOUNTER — Telehealth: Payer: Self-pay

## 2022-10-08 NOTE — Patient Outreach (Signed)
  Emmi Stroke Care Coordination Follow Up  10/08/2022 Name:  [name] MRN:  [id number] DOB:  11-03-1945  Subjective: Melissa [name] is a 77 y.o. year old female who is a primary care patient of Abner Greenspan, MD An Emmi alert was received indicating patient responded to questions: Went to follow-up appointment?. I reached out by phone to follow up on the alert and spoke to Patient. Spoke with both patient and son/caregiver-Jeff. Patient state she still remains weak but doing okay. Reviewed and addressed red alert. Son confirms that patient no longer sees Dr. Yetta Flock. He states they called new PCP-Dr. Sharl Ma this morning to make follow up appt and awaiting call back from office. Discussed neuro follow up appt. Patient voices she does not want to come all the way to [facility] to be seen by [facility] office. Discussed with patient asking PCP to make referral to neurologist in [place]. Pt/son will discuss with provider when office calls them back. They confirm patient has meds in the home-needs a few refills on some routine meds and will contact pharmacy. Son voices that therapist coming out today for visit. No further RN CM needs or concerns at this time.  Care Coordination Interventions:  Yes, provided    TOC Interventions Today    Flowsheet Row Most Recent Value  TOC Interventions   TOC Interventions Discussed/Reviewed TOC Interventions Discussed      Interventions Today    Flowsheet Row Most Recent Value  Chronic Disease   Chronic disease during today's visit Other  [stroke]  General Interventions   General Interventions Discussed/Reviewed General Interventions Discussed, Doctor Visits  Doctor Visits Discussed/Reviewed Doctor Visits Discussed, Specialist, PCP  PCP/Specialist Visits Compliance with follow-up visit  Education Interventions   Education Provided Provided Education  Provided Verbal Education On Nutrition, When to see the doctor, Medication  Nutrition Interventions    Nutrition Discussed/Reviewed Nutrition Discussed  Pharmacy Interventions   Pharmacy Dicussed/Reviewed Pharmacy Topics Discussed  Safety Interventions   Safety Discussed/Reviewed Safety Discussed       Follow up plan: No further intervention required. Patient has completed automated EMMI-STROKE post discharge calls.  Encounter Outcome:  Pt. Visit Completed    Alessandra Grout [facility] Care Management Care Management Community Coordinator Direct Phone: [phone] Toll Free: [phone] Fax: [phone]

## 2022-10-08 NOTE — Patient Outreach (Signed)
Received a red flag Emmi stroke notification for Ms. Colquitt . I have assigned Antionette Fairy, RN to call for follow up and determine if there are any Case Management needs.    Iverson Alamin, Donivan Scull [facility] Care Management Assistant [facility] Care Management [phone]

## 2022-10-08 NOTE — Progress Notes (Signed)
This encounter was created in error - please disregard.

## 2022-12-30 ENCOUNTER — Other Ambulatory Visit: Payer: Self-pay

## 2022-12-30 NOTE — Patient Outreach (Signed)
Telephone outreach to patient to obtain mRS was successfully completed. MRS= 3  Melissa [name] [facility] Care Management Assistant [phone]  

## 2023-02-03 NOTE — Progress Notes (Unsigned)
Electrophysiology Office Note:    Date:  02/03/2023   ID:  ABEEHA BOLLIN, DOB 06-26-1945, MRN [id number]  [facility] Cardiologist:  None  [facility] Electrophysiologist:  Lanier Prude, MD   Referring MD: Abner Greenspan, MD   Chief Complaint: Atrial fibrillation  History of Present Illness:    Melissa [name] is a 77 y.o. femalewho I am seeing today for an evaluation of atrial fibrillation at the request of Dr. Bjorn Pippin.  The patient was last seen by Dr. Bjorn Pippin on Sep 20, 2022 when she was hospitalized..  The patient has a medical history that includes chronic diastolic heart failure, stroke, atrial fibrillation, dementia, hypertension.  She was admitted for an acute stroke in May of this year.  During that hospitalization she underwent thrombectomy and stent placement.  She has had recurrent GI bleeding as an outpatient preventing stable use of anticoagulation.  She was discharged on Brilinta and Eliquis.  She has been hospitalized multiple times for anemia secondary to GI blood loss.        Their past medical, social and family history was reveiwed.   ROS:   Please see the history of present illness.    All other systems reviewed and are negative.  EKGs/Labs/Other Studies Reviewed:    The following studies were reviewed today:  Sep 20, 2022 echo EF 60 to 65% RV function normal No significant valvular disease       Physical Exam:    VS:  There were no vitals taken for this visit.    Wt Readings from Last 3 Encounters:  09/20/22 162 lb 14.7 oz (73.9 kg)  12/09/17 138 lb 10.7 oz (62.9 kg)     GEN: *** Well nourished, well developed in no acute distress.  Elderly CARDIAC: ***Irregularly irregular, no murmurs, rubs, gallops RESPIRATORY:  Clear to auscultation without rales, wheezing or rhonchi       ASSESSMENT AND PLAN:    No diagnosis found.  #Permanent atrial fibrillation #History of stroke #History of GI bleeding She is currently  tolerating Eliquis and Brilinta.  Recent stroke.  Presents to discuss watchman.  ***candidate?  ---------------  I have seen Melissa [name] in the office today who is being considered for a Watchman left atrial appendage closure device. I believe they will benefit from this procedure given their history of atrial fibrillation, CHA2DS2-VASc score of 7 and unadjusted ischemic stroke rate of 11.2% per year. Unfortunately, the patient is not felt to be a long term anticoagulation candidate secondary to prior GI bleeding and anemia. The patient's chart has been reviewed and I feel that they would be a candidate for short term oral anticoagulation after Watchman implant.   It is my belief that after undergoing a LAA closure procedure, Melissa [name] will not need long term anticoagulation which eliminates anticoagulation side effects and major bleeding risk.   Procedural risks for the Watchman implant have been reviewed with the patient including a 0.5% risk of stroke, <1% risk of perforation and <1% risk of device embolization. Other risks include bleeding, vascular damage, tamponade, worsening renal function, and death. The patient understands these risk and wishes to proceed.     The published clinical data on the safety and effectiveness of WATCHMAN include but are not limited to the following: - Holmes DR, Everlene Farrier, Sick P et al. for the PROTECT AF Investigators. Percutaneous closure of the left atrial appendage versus warfarin therapy for prevention of stroke in patients with atrial fibrillation: a randomised  non-inferiority trial. Lancet 2009; 374: 534-42. Everlene Farrier, Doshi SK, Isa Rankin D et al. on behalf of the PROTECT AF Investigators. Percutaneous Left Atrial Appendage Closure for Stroke Prophylaxis in Patients With Atrial Fibrillation 2.3-Year Follow-up of the PROTECT AF (Watchman Left Atrial Appendage System for Embolic Protection in Patients With Atrial Fibrillation) Trial.  Circulation 2013; 127:720-729. - Alli O, Doshi S,  Kar S, Reddy VY, Sievert H et al. Quality of Life Assessment in the Randomized PROTECT AF (Percutaneous Closure of the Left Atrial Appendage Versus Warfarin Therapy for Prevention of Stroke in Patients With Atrial Fibrillation) Trial of Patients at Risk for Stroke With Nonvalvular Atrial Fibrillation. J Am Coll Cardiol 2013; 61:1790-8. Aline August DR, Mia [place], Price M, Whisenant B, Sievert H, Doshi S, Huber K, Reddy V. Prospective randomized evaluation of the Watchman left atrial appendage Device in patients with atrial fibrillation versus long-term warfarin therapy; the PREVAIL trial. Journal of the [employer] of Cardiology, Vol. 4, No. 1, 2014, 1-11. - Kar S, Doshi SK, Sadhu A, Horton R, Osorio J et al. Primary outcome evaluation of a next-generation left atrial appendage closure device: results from the PINNACLE FLX trial. Circulation 2021;143(18)1754-1762.    After today's visit with the patient which was dedicated solely for shared decision making visit regarding LAA closure device, the patient decided to proceed with the LAA appendage closure procedure scheduled to be done in the near future at [facility]. Prior to the procedure, I would like to obtain a gated CT scan of the chest with contrast timed for PV/LA visualization.    HAS-BLED score 5 Hypertension Yes  Abnormal renal and liver function (Dialysis, transplant, Cr >2.26 mg/dL /Cirrhosis or Bilirubin >2x Normal or AST/ALT/AP >3x Normal) No  Stroke Yes  Bleeding Yes  Labile INR (Unstable/high INR) No  Elderly (>65) Yes  Drugs or alcohol (>= 8 drinks/week, anti-plt or NSAID) Yes   CHA2DS2-VASc Score = 7  The patient's score is based upon: CHF History: 1 HTN History: 1 Diabetes History: 0 Stroke History: 2 Vascular Disease History: 0 Age Score: 2 Gender Score: 1       Signed, Sheria Lang T. Lalla Brothers, MD, [facility], [facility] 02/03/2023 5:24 PM    Electrophysiology [facility]

## 2023-02-06 ENCOUNTER — Ambulatory Visit: Payer: 59 | Attending: Cardiology | Admitting: Cardiology

## 2023-02-06 DIAGNOSIS — K922 Gastrointestinal hemorrhage, unspecified: Secondary | ICD-10-CM

## 2023-02-06 DIAGNOSIS — Z8673 Personal history of transient ischemic attack (TIA), and cerebral infarction without residual deficits: Secondary | ICD-10-CM

## 2023-02-06 DIAGNOSIS — I4821 Permanent atrial fibrillation: Secondary | ICD-10-CM

## 2023-02-09 ENCOUNTER — Encounter: Payer: Self-pay | Admitting: Cardiology

## 2023-02-09 ENCOUNTER — Telehealth: Payer: Self-pay

## 2023-02-09 NOTE — Telephone Encounter (Signed)
Called to check on the patient after she no-showed to her Watchman consult 02/06/2023. Spoke with the patient at length. At this time, she does not wish to reschedule a consult. Gave her direct phone number to call should she change her mind. She was grateful for call and agreed with plan.

## 2023-06-29 DIAGNOSIS — I361 Nonrheumatic tricuspid (valve) insufficiency: Secondary | ICD-10-CM | POA: Diagnosis not present

## 2023-07-01 DIAGNOSIS — I4891 Unspecified atrial fibrillation: Secondary | ICD-10-CM | POA: Diagnosis not present

## 2023-07-08 ENCOUNTER — Inpatient Hospital Stay (HOSPITAL_COMMUNITY)
Admission: EM | Admit: 2023-07-08 | Discharge: 2023-07-15 | DRG: 024 | Disposition: A | Discharge status: Home / Self Care | Attending: Neurology | Admitting: Neurology

## 2023-07-08 ENCOUNTER — Emergency Department (HOSPITAL_COMMUNITY): Admitting: Anesthesiology

## 2023-07-08 ENCOUNTER — Emergency Department (HOSPITAL_COMMUNITY)

## 2023-07-08 ENCOUNTER — Encounter (HOSPITAL_COMMUNITY)
Admission: EM | Disposition: A | Discharge status: Home / Self Care | Payer: Self-pay | Source: Home / Self Care | Attending: Neurology

## 2023-07-08 DIAGNOSIS — R4701 Aphasia: Secondary | ICD-10-CM | POA: Diagnosis present

## 2023-07-08 DIAGNOSIS — G8191 Hemiplegia, unspecified affecting right dominant side: Secondary | ICD-10-CM | POA: Diagnosis present

## 2023-07-08 DIAGNOSIS — D649 Anemia, unspecified: Secondary | ICD-10-CM | POA: Diagnosis present

## 2023-07-08 DIAGNOSIS — Z79899 Other long term (current) drug therapy: Secondary | ICD-10-CM | POA: Diagnosis not present

## 2023-07-08 DIAGNOSIS — R471 Dysarthria and anarthria: Secondary | ICD-10-CM | POA: Diagnosis present

## 2023-07-08 DIAGNOSIS — I2510 Atherosclerotic heart disease of native coronary artery without angina pectoris: Secondary | ICD-10-CM

## 2023-07-08 DIAGNOSIS — Y848 Other medical procedures as the cause of abnormal reaction of the patient, or of later complication, without mention of misadventure at the time of the procedure: Secondary | ICD-10-CM | POA: Diagnosis not present

## 2023-07-08 DIAGNOSIS — I6349 Cerebral infarction due to embolism of other cerebral artery: Secondary | ICD-10-CM | POA: Diagnosis present

## 2023-07-08 DIAGNOSIS — Z8711 Personal history of peptic ulcer disease: Secondary | ICD-10-CM

## 2023-07-08 DIAGNOSIS — R1310 Dysphagia, unspecified: Secondary | ICD-10-CM | POA: Diagnosis present

## 2023-07-08 DIAGNOSIS — Z7901 Long term (current) use of anticoagulants: Secondary | ICD-10-CM

## 2023-07-08 DIAGNOSIS — M797 Fibromyalgia: Secondary | ICD-10-CM | POA: Diagnosis present

## 2023-07-08 DIAGNOSIS — I509 Heart failure, unspecified: Secondary | ICD-10-CM | POA: Diagnosis present

## 2023-07-08 DIAGNOSIS — Z885 Allergy status to narcotic agent status: Secondary | ICD-10-CM

## 2023-07-08 DIAGNOSIS — K219 Gastro-esophageal reflux disease without esophagitis: Secondary | ICD-10-CM | POA: Diagnosis present

## 2023-07-08 DIAGNOSIS — G4733 Obstructive sleep apnea (adult) (pediatric): Secondary | ICD-10-CM | POA: Diagnosis present

## 2023-07-08 DIAGNOSIS — I161 Hypertensive emergency: Secondary | ICD-10-CM | POA: Diagnosis present

## 2023-07-08 DIAGNOSIS — Z880 Allergy status to penicillin: Secondary | ICD-10-CM

## 2023-07-08 DIAGNOSIS — R29810 Facial weakness: Secondary | ICD-10-CM | POA: Diagnosis present

## 2023-07-08 DIAGNOSIS — Y828 Other medical devices associated with adverse incidents: Secondary | ICD-10-CM | POA: Diagnosis present

## 2023-07-08 DIAGNOSIS — Z5982 Transportation insecurity: Secondary | ICD-10-CM

## 2023-07-08 DIAGNOSIS — F0394 Unspecified dementia, unspecified severity, with anxiety: Secondary | ICD-10-CM | POA: Diagnosis present

## 2023-07-08 DIAGNOSIS — T82856A Stenosis of peripheral vascular stent, initial encounter: Secondary | ICD-10-CM | POA: Diagnosis present

## 2023-07-08 DIAGNOSIS — Z7982 Long term (current) use of aspirin: Secondary | ICD-10-CM

## 2023-07-08 DIAGNOSIS — T45516A Underdosing of anticoagulants, initial encounter: Secondary | ICD-10-CM | POA: Diagnosis present

## 2023-07-08 DIAGNOSIS — I110 Hypertensive heart disease with heart failure: Secondary | ICD-10-CM | POA: Diagnosis present

## 2023-07-08 DIAGNOSIS — Z90710 Acquired absence of both cervix and uterus: Secondary | ICD-10-CM | POA: Diagnosis not present

## 2023-07-08 DIAGNOSIS — Z8673 Personal history of transient ischemic attack (TIA), and cerebral infarction without residual deficits: Secondary | ICD-10-CM

## 2023-07-08 DIAGNOSIS — F430 Acute stress reaction: Secondary | ICD-10-CM | POA: Insufficient documentation

## 2023-07-08 DIAGNOSIS — I6389 Other cerebral infarction: Secondary | ICD-10-CM | POA: Diagnosis not present

## 2023-07-08 DIAGNOSIS — R45851 Suicidal ideations: Secondary | ICD-10-CM | POA: Diagnosis present

## 2023-07-08 DIAGNOSIS — E119 Type 2 diabetes mellitus without complications: Secondary | ICD-10-CM | POA: Diagnosis present

## 2023-07-08 DIAGNOSIS — I6602 Occlusion and stenosis of left middle cerebral artery: Secondary | ICD-10-CM

## 2023-07-08 DIAGNOSIS — I639 Cerebral infarction, unspecified: Principal | ICD-10-CM

## 2023-07-08 DIAGNOSIS — R29714 NIHSS score 14: Secondary | ICD-10-CM | POA: Diagnosis present

## 2023-07-08 DIAGNOSIS — I482 Chronic atrial fibrillation, unspecified: Secondary | ICD-10-CM | POA: Diagnosis present

## 2023-07-08 DIAGNOSIS — Z5986 Financial insecurity: Secondary | ICD-10-CM

## 2023-07-08 DIAGNOSIS — L7622 Postprocedural hemorrhage and hematoma of skin and subcutaneous tissue following other procedure: Secondary | ICD-10-CM | POA: Diagnosis not present

## 2023-07-08 DIAGNOSIS — I63512 Cerebral infarction due to unspecified occlusion or stenosis of left middle cerebral artery: Secondary | ICD-10-CM | POA: Diagnosis present

## 2023-07-08 DIAGNOSIS — J4489 Other specified chronic obstructive pulmonary disease: Secondary | ICD-10-CM | POA: Diagnosis present

## 2023-07-08 DIAGNOSIS — Z886 Allergy status to analgesic agent status: Secondary | ICD-10-CM

## 2023-07-08 DIAGNOSIS — F411 Generalized anxiety disorder: Secondary | ICD-10-CM | POA: Diagnosis present

## 2023-07-08 DIAGNOSIS — E782 Mixed hyperlipidemia: Secondary | ICD-10-CM | POA: Diagnosis present

## 2023-07-08 DIAGNOSIS — Z87891 Personal history of nicotine dependence: Secondary | ICD-10-CM

## 2023-07-08 DIAGNOSIS — Z7902 Long term (current) use of antithrombotics/antiplatelets: Secondary | ICD-10-CM

## 2023-07-08 DIAGNOSIS — B852 Pediculosis, unspecified: Secondary | ICD-10-CM | POA: Diagnosis present

## 2023-07-08 DIAGNOSIS — Z888 Allergy status to other drugs, medicaments and biological substances status: Secondary | ICD-10-CM

## 2023-07-08 DIAGNOSIS — Z91148 Patient's other noncompliance with medication regimen for other reason: Secondary | ICD-10-CM

## 2023-07-08 LAB — I-STAT CHEM 8, ED
BUN: 4 mg/dL — ABNORMAL LOW (ref 8–23)
Calcium, Ion: 0.97 mmol/L — ABNORMAL LOW (ref 1.15–1.40)
Chloride: 107 mmol/L (ref 98–111)
Creatinine, Ser: 1 mg/dL (ref 0.44–1.00)
Glucose, Bld: 100 mg/dL — ABNORMAL HIGH (ref 70–99)
HCT: 25 % — ABNORMAL LOW (ref 36.0–46.0)
Hemoglobin: 8.5 g/dL — ABNORMAL LOW (ref 12.0–15.0)
Potassium: 3.7 mmol/L (ref 3.5–5.1)
Sodium: 138 mmol/L (ref 135–145)
TCO2: 22 mmol/L (ref 22–32)

## 2023-07-08 LAB — RESP PANEL BY RT-PCR (RSV, FLU A&B, COVID)  RVPGX2
Influenza A by PCR: NEGATIVE
Influenza B by PCR: NEGATIVE
Resp Syncytial Virus by PCR: NEGATIVE
SARS Coronavirus 2 by RT PCR: NEGATIVE

## 2023-07-08 SURGERY — RADIOLOGY WITH ANESTHESIA
Anesthesia: General

## 2023-07-08 MED ORDER — SUGAMMADEX SODIUM 200 MG/2ML IV SOLN
INTRAVENOUS | Status: DC | PRN
  Administered 2023-07-08: 200 mg via INTRAVENOUS

## 2023-07-08 MED ORDER — OXYCODONE HCL 5 MG/5ML PO SOLN: 5.0000 mg | Freq: Once | ORAL | Status: DC | PRN

## 2023-07-08 MED ORDER — SENNOSIDES-DOCUSATE SODIUM 8.6-50 MG PO TABS: 1.0000 | ORAL_TABLET | Freq: Every evening | ORAL | Status: DC | PRN

## 2023-07-08 MED ORDER — GLYCOPYRROLATE 0.2 MG/ML IJ SOLN
INTRAMUSCULAR | Status: DC | PRN
  Administered 2023-07-08: .2 mg via INTRAVENOUS

## 2023-07-08 MED ORDER — OXYCODONE HCL 5 MG PO TABS: 5.0000 mg | ORAL_TABLET | Freq: Once | ORAL | Status: DC | PRN

## 2023-07-08 MED ORDER — SUCCINYLCHOLINE CHLORIDE 200 MG/10ML IV SOSY
PREFILLED_SYRINGE | INTRAVENOUS | Status: DC | PRN
  Administered 2023-07-08: 100 mg via INTRAVENOUS

## 2023-07-08 MED ORDER — ROCURONIUM BROMIDE 100 MG/10ML IV SOLN
INTRAVENOUS | Status: DC | PRN
  Administered 2023-07-08: 50 mg via INTRAVENOUS

## 2023-07-08 MED ORDER — ACETAMINOPHEN 650 MG RE SUPP: 650.0000 mg | RECTAL | Status: DC | PRN

## 2023-07-08 MED ORDER — EPTIFIBATIDE 20 MG/10ML IV SOLN
INTRAVENOUS | Status: AC
  Filled 2023-07-08: qty 10

## 2023-07-08 MED ORDER — IOHEXOL 300 MG/ML  SOLN
150.0000 mL | Freq: Once | INTRAMUSCULAR | Status: AC | PRN
  Administered 2023-07-08: 95 mL via INTRA_ARTERIAL

## 2023-07-08 MED ORDER — DEXAMETHASONE SODIUM PHOSPHATE 10 MG/ML IJ SOLN
INTRAMUSCULAR | Status: DC | PRN
  Administered 2023-07-08: 4 mg via INTRAVENOUS

## 2023-07-08 MED ORDER — CLEVIDIPINE BUTYRATE 0.5 MG/ML IV EMUL
INTRAVENOUS | Status: AC
  Filled 2023-07-08: qty 50

## 2023-07-08 MED ORDER — ACETAMINOPHEN 325 MG PO TABS: 650.0000 mg | ORAL_TABLET | ORAL | Status: DC | PRN

## 2023-07-08 MED ORDER — PROPOFOL 10 MG/ML IV BOLUS
INTRAVENOUS | Status: DC | PRN
  Administered 2023-07-08: 100 mg via INTRAVENOUS

## 2023-07-08 MED ORDER — CLEVIDIPINE BUTYRATE 0.5 MG/ML IV EMUL
0.0000 mg/h | INTRAVENOUS | Status: DC
  Administered 2023-07-08 – 2023-07-09 (×2): 2 mg/h via INTRAVENOUS
  Administered 2023-07-09: 6 mg/h via INTRAVENOUS
  Administered 2023-07-09 (×2): 14 mg/h via INTRAVENOUS
  Filled 2023-07-08 (×4): qty 50

## 2023-07-08 MED ORDER — LIDOCAINE HCL (CARDIAC) PF 100 MG/5ML IV SOSY
PREFILLED_SYRINGE | INTRAVENOUS | Status: DC | PRN
  Administered 2023-07-08: 100 mg via INTRAVENOUS

## 2023-07-08 MED ORDER — NITROGLYCERIN 1 MG/10 ML FOR IR/CATH LAB
INTRA_ARTERIAL | Status: AC
  Filled 2023-07-08: qty 10

## 2023-07-08 MED ORDER — IOHEXOL 350 MG/ML SOLN
100.0000 mL | Freq: Once | INTRAVENOUS | Status: AC | PRN
  Administered 2023-07-08: 100 mL via INTRAVENOUS

## 2023-07-08 MED ORDER — ONDANSETRON HCL 4 MG/2ML IJ SOLN
INTRAMUSCULAR | Status: DC | PRN
  Administered 2023-07-08: 4 mg via INTRAVENOUS

## 2023-07-08 MED ORDER — ONDANSETRON HCL 4 MG/2ML IJ SOLN: 4.0000 mg | Freq: Four times a day (QID) | INTRAMUSCULAR | Status: DC | PRN

## 2023-07-08 MED ORDER — EPTIFIBATIDE 20 MG/10ML IV SOLN
INTRAVENOUS | Status: AC | PRN
  Administered 2023-07-08 (×4): 1.5 mg via INTRAVENOUS

## 2023-07-08 MED ORDER — FENTANYL CITRATE PF 50 MCG/ML IJ SOSY: 25.0000 ug | PREFILLED_SYRINGE | INTRAMUSCULAR | Status: DC | PRN

## 2023-07-08 MED ORDER — ACETAMINOPHEN 160 MG/5ML PO SOLN: 650.0000 mg | ORAL | Status: DC | PRN

## 2023-07-08 MED ORDER — ASPIRIN 300 MG RE SUPP
300.0000 mg | Freq: Once | RECTAL | Status: AC
  Administered 2023-07-08: 300 mg via RECTAL
  Filled 2023-07-08: qty 1

## 2023-07-08 MED ORDER — LACTATED RINGERS IV SOLN: INTRAVENOUS | Status: DC | PRN

## 2023-07-08 MED ORDER — STROKE: EARLY STAGES OF RECOVERY BOOK
Freq: Once | Status: AC
  Filled 2023-07-08: qty 1

## 2023-07-08 MED ORDER — CLEVIDIPINE BUTYRATE 0.5 MG/ML IV EMUL
INTRAVENOUS | Status: AC
  Filled 2023-07-08: qty 100

## 2023-07-08 MED ORDER — SODIUM CHLORIDE 0.9% FLUSH: 3.0000 mL | Freq: Once | INTRAVENOUS | Status: DC

## 2023-07-08 MED ORDER — SODIUM CHLORIDE 0.9 % IV SOLN: INTRAVENOUS | Status: DC

## 2023-07-08 MED ORDER — NITROGLYCERIN 1 MG/10 ML FOR IR/CATH LAB
INTRA_ARTERIAL | Status: AC | PRN
  Administered 2023-07-08 (×2): 25 ug

## 2023-07-08 MED ORDER — CLEVIDIPINE BUTYRATE 0.5 MG/ML IV EMUL
INTRAVENOUS | Status: DC | PRN
  Administered 2023-07-08: 1 mg/h via INTRAVENOUS

## 2023-07-08 MED ORDER — LABETALOL HCL 5 MG/ML IV SOLN
INTRAVENOUS | Status: DC | PRN
  Administered 2023-07-08: 5 mg via INTRAVENOUS
  Administered 2023-07-08: 10 mg via INTRAVENOUS

## 2023-07-08 MED ORDER — PHENYLEPHRINE HCL (PRESSORS) 10 MG/ML IV SOLN
INTRAVENOUS | Status: DC | PRN
  Administered 2023-07-08: 200 ug via INTRAVENOUS
  Administered 2023-07-08: 80 ug via INTRAVENOUS
  Administered 2023-07-08: 160 ug via INTRAVENOUS

## 2023-07-08 NOTE — Procedures (Signed)
 INR.  Status post left common carotid arteriogram.  Right CFA approach.  Findings.  1.  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  Status post complete revascularization of the IntraStent distal MCA occlusion with 1 pass with an 062/043 contact   aspiration achieving a TICI 3 revascularization.  Post CT brain no evidence of intracranial hemorrhage.  8 French Angio-Seal closure device deployed for hemostasis at the right groin puncture site.  Distal pulses all present bilaterally unchanged from prior to the procedure.  Patient given a total of 4.5 mg of intra-arterial Integrilin.  Patient extubated.  Following simple commands appropriately.  Able to raise her right arm and leg against gravity.  Fatima Sanger MD.

## 2023-07-08 NOTE — H&P (Addendum)
 NEUROLOGY H&P NOTE   Date of service: July 08, 2023 Patient Name: Melissa [name] MRN:  [id number] DOB:  November 07, 1945 Chief Complaint: "Code stroke"  History of Present Illness  Melissa [name] is a 78 y.o. 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 On exam, patient is alert, aphasic with right-sided facial droop, right-sided weakness. She does not follow commands and does not answer questions.  CT head showed no evidence of acute abnormality.  CT angio with perfusion showed subocclusive filling defects within stented left M1 segment with resultant severe stenosis.  CT perfusion showed an 8 mm core infarct within the left MCA territory with 56 mL of penumbra and a mismatch volume of 48 mL. Imaging was reviewed by Dr. Amada Jupiter and Dr. Corliss Skains who then spoke with patient's son, Melissa [name].  Risk and benefits of the procedure were explained over the phone.  Son gave consent with Dr. Amada Jupiter and NP Richardo Priest as witnesses.  Patient was taken to IR suite for mechanical thrombectomy. Som, [employer], confirmed that patient should be a full code at this time.   Patient was admitted in May 2024 with a left MCA infarct.  She went to IR for a mechanical thrombectomy where a rescue stent was needed to achieve tici 3 in the left M1.  MRI showed left basal ganglia, temporal, frontal infarcts.  She was placed on Brilinta and Eliquis for 6 months then aspirin alone.  At time of discharge she was noted to have slight weakness in the right arm with drift and a slight right facial droop. She was kept on eliquis by outpatient cardiology due to her atrial fibrillation. Son states she walks with a cane or walker at baseline, needs help eating, but is able to hold a normal  conversation and interact accordingly, and has an overall good quality of life at her baseline.   LKW: 2100 3/4 Modified rankin score: 3-Moderate disability-requires help but walks WITHOUT assistance  (Uses cane or walker at home) IV Thrombolysis: No, outside of window EVT: Yes, taken for mechanical thrombectomy   NIHSS components Score: Comment  1a Level of Conscious 0[x]  1[]  2[]  3[]      1b LOC Questions 0[]  1[]  2[x]       1c LOC Commands 0[]  1[]  2[x]       2 Best Gaze 0[]  1[x]  2[]       3 Visual 0[]  1[x]  2[]  3[]      4 Facial Palsy 0[]  1[x]  2[]  3[]      5a Motor Arm - left 0[x]  1[]  2[]  3[]  4[]  UN[]    5b Motor Arm - Right 0[]  1[]  2[x]  3[]  4[]  UN[]    6a Motor Leg - Left 0[x]  1[]  2[]  3[]  4[]  UN[]    6b Motor Leg - Right 0[]  1[]  2[x]  3[]  4[]  UN[]    7 Limb Ataxia 0[x]  1[]  2[]  3[]  UN[]     8 Sensory 0[x]  1[]  2[]  UN[]      9 Best Language 0[]  1[]  2[x]  3[]      10 Dysarthria 0[]  1[x]  2[]  UN[]      11 Extinct. and Inattention 0[x]  1[]  2[]       TOTAL:   14      ROS   Unable to perform due to AMS  Past History   Past Medical History:  Diagnosis Date   Anemia  Anxiety    Arthritis    "all over" (12/08/2017)   Asthma    CHF (congestive heart failure) (HCC)    Chronic lower back pain    COPD (chronic obstructive pulmonary disease) (HCC)    Dementia (HCC)    Depression    Diabetes mellitus without complication (HCC)    Fibromyalgia    Gastric ulcer    GERD (gastroesophageal reflux disease)    Guillain Barr syndrome (HCC)    Hematemesis 12/07/2017   History of blood transfusion    "low blood" (12/08/2017)   History of stomach ulcers    Hypercholesterolemia    Hypertension    Migraine    "used to have them; don't have them anymore" (12/08/2017)   Pneumonia    "probably twice" (12/08/2017)   Sleep apnea    "couldn't take the mask" (12/08/2017)   Stroke (HCC)    "I don't remember if I have had one or not; I believe I have" (12/08/2017)   Past Surgical History:  Procedure Laterality  Date   ABDOMINAL HERNIA REPAIR     ABDOMINAL HYSTERECTOMY     APPENDECTOMY     BIOPSY  12/08/2017   Procedure: BIOPSY;  Surgeon: Lynann Bologna, MD;  Location: [facility] ENDOSCOPY;  Service: Endoscopy;;   BREAST SURGERY Left    "tumors removed; not cancer"   BUNIONECTOMY Bilateral    CARPAL TUNNEL RELEASE Bilateral    CATARACT EXTRACTION, BILATERAL Bilateral    CHOLECYSTECTOMY     COLONOSCOPY  03/03/2011   Mild sigmoid diverticulosis. Internal hemorrhoids.    DILATION AND CURETTAGE OF UTERUS     ELBOW FRACTURE SURGERY Left    ESOPHAGOGASTRODUODENOSCOPY N/A 12/08/2017   Procedure: ESOPHAGOGASTRODUODENOSCOPY (EGD);  Surgeon: Lynann Bologna, MD;  Location: [facility] ENDOSCOPY;  Service: Endoscopy;  Laterality: N/A;   ESOPHAGOGASTRODUODENOSCOPY ENDOSCOPY  12/08/2017   w/bx   FRACTURE SURGERY     HERNIA REPAIR     HIP FRACTURE SURGERY Left    IR CT HEAD LTD  09/20/2022   IR CT HEAD LTD  09/20/2022   IR PERCUTANEOUS ART THROMBECTOMY/INFUSION INTRACRANIAL INC DIAG ANGIO  09/20/2022   KNEE ARTHROSCOPY Right    RADIOLOGY WITH ANESTHESIA N/A 09/20/2022   Procedure: IR WITH ANESTHESIA;  Surgeon: Julieanne Cotton, MD;  Location: [facility] OR;  Service: Radiology;  Laterality: N/A;   SHOULDER OPEN ROTATOR CUFF REPAIR Bilateral    TONSILLECTOMY     No family history on file. Social History   Socioeconomic History   Marital status: Divorced    Spouse name: Not on file   Number of children: Not on file   Years of education: Not on file   Highest education level: Not on file  Occupational History   Not on file  Tobacco Use   Smoking status: Never   Smokeless tobacco: Former    Types: Chew   Tobacco comments:    12/08/2017 "stopped chewing a few years back"  Vaping Use   Vaping status: Never Used  Substance and Sexual Activity   Alcohol use: Never   Drug use: Never   Sexual activity: Not Currently  Other Topics Concern   Not on file  Social History Narrative   Not on file   Social Drivers of Health    Financial Resource Strain: Medium Risk (06/22/2023)   Received [facility]   Overall Financial Resource Strain (CARDIA)    Difficulty of Paying Living Expenses: Somewhat hard  Food Insecurity: No Food Insecurity (06/22/2023)   Received from [facility]  Health Care   Hunger Vital Sign    Worried About Running Out of Food in the Last Year: Never true    Ran Out of Food in the Last Year: Never true  Transportation Needs: Unmet Transportation Needs (06/22/2023)   Received from [facility] - Transportation    Lack of Transportation (Medical): Yes    Lack of Transportation (Non-Medical): Yes  Physical Activity: Inactive (06/22/2023)   Received from [facility]   Exercise Vital Sign    Days of Exercise per Week: 0 days    Minutes of Exercise per Session: 0 min  Stress: Stress Concern Present (06/22/2023)   Received from [facility] - Occupational Stress Questionnaire    Feeling of Stress : To some extent  Social Connections: Unknown (06/22/2023)   Received from [facility]   Social Connection and Isolation Panel [NHANES]    Frequency of Communication with Friends and Family: Three times a week    Frequency of Social Gatherings with Friends and Family: Three times a week    Attends Religious Services: Never    Active Member of Clubs or Organizations: No    Attends Banker Meetings: Never    Marital Status: Patient unable to answer   Allergies  Allergen Reactions   Nsaids Other (See Comments)    CAN TAKE ONLY TYLENOL- NOTHING ELSE!!!!!!   Codeine Nausea And Vomiting and Other (See Comments)    GI Intolerance   Lyrica [Pregabalin] Other (See Comments)    Reaction not known at this time   Penicillins Other (See Comments)    Childhood allergy- exact reaction not noted    Medications   Current Facility-Administered Medications:    [START ON 07/09/2023]  stroke: early stages of recovery book, , Does not  apply, Once, Richardo Priest, Erin C, NP   0.9 %  sodium chloride infusion, , Intravenous, Continuous, Richardo Priest, Erin C, NP   acetaminophen (TYLENOL) tablet 650 mg, 650 mg, Oral, Q4H PRN **OR** acetaminophen (TYLENOL) 160 MG/5ML solution 650 mg, 650 mg, Per Tube, Q4H PRN **OR** acetaminophen (TYLENOL) suppository 650 mg, 650 mg, Rectal, Q4H PRN, Richardo Priest, Erin C, NP   senna-docusate (Senokot-S) tablet 1 tablet, 1 tablet, Oral, QHS PRN, Richardo Priest, Erin C, NP   sodium chloride flush (NS) 0.9 % injection 3 mL, 3 mL, Intravenous, Once, Scheving, Jerilee Field, MD  Current Outpatient Medications:    albuterol (VENTOLIN HFA) 108 (90 Base) MCG/ACT inhaler, Inhale 2 puffs into the lungs every 6 (six) hours as needed for wheezing or shortness of breath., Disp: , Rfl:    apixaban (ELIQUIS) 5 MG TABS tablet, Take 1 tablet (5 mg total) by mouth 2 (two) times daily., Disp: 60 tablet, Rfl: 0   docusate sodium (COLACE) 100 MG capsule, Take 1 capsule (100 mg total) by mouth daily., Disp: 10 capsule, Rfl: 0   ferrous sulfate 325 (65 FE) MG tablet, Take 1 tablet (325 mg total) by mouth daily. (Patient taking differently: Take 325 mg by mouth daily with breakfast.), Disp: 30 tablet, Rfl: 11   lisinopril (ZESTRIL) 5 MG tablet, Take 1 tablet (5 mg total) by mouth daily., Disp: 30 tablet, Rfl: 0   omeprazole (PRILOSEC) 40 MG capsule, Take 40 mg by mouth daily before breakfast., Disp: , Rfl:    rosuvastatin (CRESTOR) 40 MG tablet, Take 1 tablet (40 mg total) by mouth daily., Disp: 30 tablet, Rfl: 0   ticagrelor (BRILINTA) 90 MG  TABS tablet, Take 0.5 tablets (45 mg total) by mouth 2 (two) times daily., Disp: 60 tablet, Rfl: 0   TYLENOL 500 MG tablet, Take 500-1,000 mg by mouth every 6 (six) hours as needed for mild pain or headache., Disp: , Rfl:   Facility-Administered Medications Ordered in Other Encounters:    lactated ringers infusion, , Intravenous, Continuous PRN, Widener, Nickolas, CRNA, New Bag at 07/08/23 1920   lidocaine (cardiac)  100 mg/29mL (XYLOCAINE) injection 2%, , Intravenous, Anesthesia Intra-op, Widener, Nickolas, CRNA, 100 mg at 07/08/23 1921   propofol (DIPRIVAN) 10 mg/mL bolus/IV push, , Intravenous, Anesthesia Intra-op, Widener, Nickolas, CRNA, 100 mg at 07/08/23 1921   succinylcholine (ANECTINE) syringe, , Intravenous, Anesthesia Intra-op, Widener, Nickolas, CRNA, 100 mg at 07/08/23 1921   Vitals   Vitals:   07/08/23 1812 07/08/23 1850 07/08/23 1859 07/08/23 1903  BP:    (!) 189/76  Pulse:  (!) 54  (!) 54  Resp:  15  15  Temp:  97.7 F (36.5 C)  97.7 F (36.5 C)  TempSrc:  Oral    SpO2:  98% 98% 98%  Weight: 69.8 kg        Body mass index is 28.15 kg/m.  Physical Exam   Constitutional: Appears dissheveld Eyes: No scleral injection.  HENT: No OP obstruction.  Head: Normocephalic.  Cardiovascular: Normal rate and regular rhythm. Hypertensive Respiratory: Effort normal, non-labored breathing.  GI: Soft.  No distension. There is no tenderness.  Skin: WDI.   Neurologic Examination   Patient is alert and looks around.  She does not answer questions she does not follow commands.  She is globally aphasic with mild dysarthria.  Her only verbal output was I do not know and ow. She has a left gaze preference but does cross midline.  She has intermittent blink to threat on the right side.  Right hemianopia. She does not consistently track examiner. She has right facial droop She has right arm and right leg weakness with drift Unable to test ataxia and sensory deficit due to patient's inability to follow commands.  Labs   CBC:  Recent Labs  Lab 07/08/23 1814  HGB 8.5*  HCT 25.0*   Basic Metabolic Panel:  Lab Results  Component Value Date   NA 138 07/08/2023   K 3.7 07/08/2023   CO2 19 (L) 09/21/2022   GLUCOSE 100 (H) 07/08/2023   BUN 4 (L) 07/08/2023   CREATININE 1.00 07/08/2023   CALCIUM 8.8 (L) 09/21/2022   GFRNONAA 56 (L) 09/21/2022   GFRAA 57 (L) 12/09/2017   Lipid Panel:  Lab  Results  Component Value Date   LDLCALC 120 (H) 09/20/2022   HgbA1c:  Lab Results  Component Value Date   HGBA1C 5.5 09/21/2022   Urine Drug Screen: No results found for: "LABOPIA", "COCAINSCRNUR", "LABBENZ", "AMPHETMU", "THCU", "LABBARB"  Alcohol Level No results found for: "ETH" INR  Lab Results  Component Value Date   INR 1.1 09/20/2022   APTT  Lab Results  Component Value Date   APTT 78 (H) 09/20/2022    CT Head without contrast(Personally reviewed): No evidence of acute intracranial abnormality Parenchymal atrophy chronic small vessel ischemic disease and chronic infarcts seen  CT angio Head and Neck with contrast with perfusion (Personally reviewed): Subocclusive filling defect (consistent with thrombus) within the stented portion of left MCA M1 with resultant severe stenosis at the site. Severe stenosis within left MCA M3 8 mm core infarct within left MCA vascular territory 56 mm penumbra within left MCA  vascular territory with mismatch volume of 48 mL  MRI Brain(Personally reviewed): PENDING   Assessment   Melissa [name] is a 78 y.o. 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  On exam, she is disoriented, not following commands, has left gaze preference, right hemianopia, right facial droop, right arm weakness and right leg weakness. CTA showed thrombosis in left MCA M1 stent with perfusion showing 8ml core infarct and 56ml penumbra. Son gave consent for emergent thrombectomy in IR.   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   Labetalol IVP PRN, Cleviprex gtt as needed - Telemetry monitoring for arrhythmia  - Swallow screen - will be performed prior to PO intake - Stroke education - will be given - PT/OT/SLP - Dispo: admit to ICU under Neurology  Stroke team will take over as attending starting 3/6 AM  ______________________________________________________________________   Signed, Lynnae January, NP [facility]  I have seen the patient and was present for the entirety of the evaluation and management reflected in the above note.   There is a chance that she has not restarted her blood thinners per her son, as she just recently got out of the hospital and he is not certain.  At home it appears that she is on Eliquis and ticagrelor.  She has a nearly occlusive left M1 stenotic thrombus in her stent with resultant symptoms consistent with this lesion and perfusion deficit due to it.  In that setting, I would favor treating this as an occlusive process as it is clearly flow-limiting.  At baseline, she needs help with some ADLs, but she is able to walk with a cane or walker, and per her son is able to have a fairly full conversation and has significant quality of life still.  I discussed that I suspected that her quality of life would decrease some as she does appear to already have some degree of infarct, but that it is possible that we could restore some of her ability to communicate.  I asked if she would want to proceed with intubation/needing a breathing tube and aggressive measures in the  setting and he indicated that he does think that she would want to undergo those measures given that she did have significant quality of life before this.  Given this I discussed with Dr. Corliss Skains and we both discussed with the patient and he agreed to proceed with the procedure.  This patient is critically ill and at significant risk of neurological worsening, death and care requires constant monitoring of vital signs, hemodynamics,respiratory and cardiac monitoring, neurological assessment, discussion with family, other specialists and medical decision making of high complexity. I spent 58 minutes of neurocritical care time  in the care of  this patient. This was time spent independent of any time provided by nurse practitioner or PA.  Ritta Slot, MD [facility]   If 7pm- 7am, please page neurology on call as listed in AMION. 07/08/2023  8:02 PM

## 2023-07-08 NOTE — Anesthesia Preprocedure Evaluation (Signed)
 Anesthesia Evaluation  Patient identified by MRN, date of birth, ID band Patient confused  General Assessment Comment:Non-verbal.  Code Stroke  Reviewed: Allergy & Precautions, H&P , NPO status , Patient's Chart, lab work & pertinent test resultsPreop documentation limited or incomplete due to emergent nature of procedure.  Airway Mallampati: Unable to assess       Dental   Pulmonary asthma , sleep apnea , COPD   breath sounds clear to auscultation       Cardiovascular hypertension, + CAD and +CHF   Rhythm:regular Rate:Normal     Neuro/Psych  Headaches PSYCHIATRIC DISORDERS Anxiety Depression   Dementia Code stroke CVA    GI/Hepatic PUD,GERD  ,,  Endo/Other  diabetes, Type 2    Renal/GU      Musculoskeletal  (+) Arthritis ,  Fibromyalgia -  Abdominal   Peds  Hematology   Anesthesia Other Findings   Reproductive/Obstetrics                             Anesthesia Physical Anesthesia Plan  ASA: 4 and emergent  Anesthesia Plan: General   Post-op Pain Management:    Induction: Intravenous  PONV Risk Score and Plan: 3 and Ondansetron, Dexamethasone and Treatment may vary due to age or medical condition  Airway Management Planned: Oral ETT  Additional Equipment: Arterial line  Intra-op Plan:   Post-operative Plan: Possible Post-op intubation/ventilation  Informed Consent: I have reviewed the patients History and Physical, chart, labs and discussed the procedure including the risks, benefits and alternatives for the proposed anesthesia with the patient or authorized representative who has indicated his/her understanding and acceptance.       Plan Discussed with: CRNA and Anesthesiologist  Anesthesia Plan Comments:         Anesthesia Quick Evaluation

## 2023-07-08 NOTE — Transfer of Care (Signed)
 Immediate Anesthesia Transfer of Care Note  Patient: Melissa [name]  Procedure(s) Performed: RADIOLOGY WITH ANESTHESIA  Patient Location: PACU  Anesthesia Type:General  Level of Consciousness: awake and alert   Airway & Oxygen Therapy: Patient Spontanous Breathing  Post-op Assessment: Report given to RN, Post -op Vital signs reviewed and stable, and Patient moving all extremities  Post vital signs: Reviewed and stable  Last Vitals:  Vitals Value Taken Time  BP 131/97 07/08/23 2115  Temp 36.1 C 07/08/23 2112  Pulse 60 07/08/23 2120  Resp 17 07/08/23 2120  SpO2 93 % 07/08/23 2120  Vitals shown include unfiled device data.  Last Pain:  Vitals:   07/08/23 1904  TempSrc:   PainSc: 0-No pain         Complications: No notable events documented.

## 2023-07-08 NOTE — ED Triage Notes (Addendum)
 BIB [facility] from home for possible code stroke, LKW 2000 last night, Right sided weakness, right sided facial droop, slurred speech with altered mental status noted with EMS. Patient has a history of stroke, HTN, MI and on eliquis, Baseline is A/O and uses a cane or walker to ambulate.   Bedbugs found when undressing patient

## 2023-07-08 NOTE — ED Provider Notes (Signed)
  EMERGENCY DEPARTMENT AT [facility] Provider Note  CSN: [id number] Arrival date & time: 07/08/23 1805  Chief Complaint(s) Code Stroke  HPI Melissa [name] is a 78 y.o. female history of COPD, dementia, CHF, diabetes presenting to the emergency department right-sided weakness.  Patient apparently was last known well around 8 PM last night.  This morning was noted to have slurred speech, right-sided weakness.  Did not want to come to the hospital.  Patient also trouble speaking.  History limited due to aphasia.   Past Medical History Past Medical History:  Diagnosis Date   Anemia    Anxiety    Arthritis    "all over" (12/08/2017)   Asthma    CHF (congestive heart failure) (HCC)    Chronic lower back pain    COPD (chronic obstructive pulmonary disease) (HCC)    Dementia (HCC)    Depression    Diabetes mellitus without complication (HCC)    Fibromyalgia    Gastric ulcer    GERD (gastroesophageal reflux disease)    Guillain Barr syndrome (HCC)    Hematemesis 12/07/2017   History of blood transfusion    "low blood" (12/08/2017)   History of stomach ulcers    Hypercholesterolemia    Hypertension    Migraine    "used to have them; don't have them anymore" (12/08/2017)   Pneumonia    "probably twice" (12/08/2017)   Sleep apnea    "couldn't take the mask" (12/08/2017)   Stroke (HCC)    "I don't remember if I have had one or not; I believe I have" (12/08/2017)   Patient Active Problem List   Diagnosis Date Noted   Stroke (cerebrum) (HCC) 09/20/2022   Middle cerebral artery embolism, 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   Hematemesis 12/07/2017   Acute blood loss anemia 12/07/2017   Dementia (HCC) 12/07/2017   CHF (congestive heart failure) (HCC) 12/07/2017   COPD (chronic obstructive pulmonary disease) (HCC) 12/07/2017   History of completed stroke 12/07/2017   Unspecified atrial fibrillation (HCC) 12/07/2017   CKD (chronic kidney disease), stage III (HCC) 12/07/2017   History of  diet-controlled diabetes 12/07/2017   Chronic bilateral low back pain without sciatica 10/02/2016   Gastroesophageal reflux disease without esophagitis 08/26/2016   GAD (generalized anxiety disorder) 07/22/2016   Depression 03/19/2016   Insomnia 03/19/2016   Coronary artery disease involving native coronary artery of native heart without angina pectoris 10/05/2014   Mixed hyperlipidemia 10/05/2014   Home Medication(s) Prior to Admission medications   Medication Sig Start Date End Date Taking? Authorizing Provider  albuterol (VENTOLIN HFA) 108 (90 Base) MCG/ACT inhaler Inhale 2 puffs into the lungs every 6 (six) hours as needed for wheezing or shortness of breath.    [provider]  apixaban (ELIQUIS) 5 MG TABS tablet Take 1 tablet (5 mg total) by mouth 2 (two) times daily. 09/23/22   Lynnae January, NP  docusate sodium (COLACE) 100 MG capsule Take 1 capsule (100 mg total) by mouth daily. 09/24/22   Lynnae January, NP  ferrous sulfate 325 (65 FE) MG tablet Take 1 tablet (325 mg total) by mouth daily. Patient taking differently: Take 325 mg by mouth daily with breakfast. 12/09/17 09/20/22  Tyrone Nine, MD  lisinopril (ZESTRIL) 5 MG tablet Take 1 tablet (5 mg total) by mouth daily. 09/24/22   Lynnae January, NP  omeprazole (PRILOSEC) 40 MG capsule Take 40 mg by mouth daily before breakfast. 07/07/22   [provider]  rosuvastatin (CRESTOR)  40 MG tablet Take 1 tablet (40 mg total) by mouth daily. 09/24/22   Lynnae January, NP  ticagrelor (BRILINTA) 90 MG TABS tablet Take 0.5 tablets (45 mg total) by mouth 2 (two) times daily. 09/23/22   Lynnae January, NP  TYLENOL 500 MG tablet Take 500-1,000 mg by mouth every 6 (six) hours as needed for mild pain or headache.    [provider]                                                                                                                                    Past Surgical History Past Surgical History:  Procedure Laterality  Date   ABDOMINAL HERNIA REPAIR     ABDOMINAL HYSTERECTOMY     APPENDECTOMY     BIOPSY  12/08/2017   Procedure: BIOPSY;  Surgeon: Lynann Bologna, MD;  Location: [facility] ENDOSCOPY;  Service: Endoscopy;;   BREAST SURGERY Left    "tumors removed; not cancer"   BUNIONECTOMY Bilateral    CARPAL TUNNEL RELEASE Bilateral    CATARACT EXTRACTION, BILATERAL Bilateral    CHOLECYSTECTOMY     COLONOSCOPY  03/03/2011   Mild sigmoid diverticulosis. Internal hemorrhoids.    DILATION AND CURETTAGE OF UTERUS     ELBOW FRACTURE SURGERY Left    ESOPHAGOGASTRODUODENOSCOPY N/A 12/08/2017   Procedure: ESOPHAGOGASTRODUODENOSCOPY (EGD);  Surgeon: Lynann Bologna, MD;  Location: [facility] ENDOSCOPY;  Service: Endoscopy;  Laterality: N/A;   ESOPHAGOGASTRODUODENOSCOPY ENDOSCOPY  12/08/2017   w/bx   FRACTURE SURGERY     HERNIA REPAIR     HIP FRACTURE SURGERY Left    IR CT HEAD LTD  09/20/2022   IR CT HEAD LTD  09/20/2022   IR PERCUTANEOUS ART THROMBECTOMY/INFUSION INTRACRANIAL INC DIAG ANGIO  09/20/2022   KNEE ARTHROSCOPY Right    RADIOLOGY WITH ANESTHESIA N/A 09/20/2022   Procedure: IR WITH ANESTHESIA;  Surgeon: Julieanne Cotton, MD;  Location: [facility] OR;  Service: Radiology;  Laterality: N/A;   SHOULDER OPEN ROTATOR CUFF REPAIR Bilateral    TONSILLECTOMY     Family History No family history on file.  Social History Social History   Tobacco Use   Smoking status: Never   Smokeless tobacco: Former    Types: Chew   Tobacco comments:    12/08/2017 "stopped chewing a few years back"  Vaping Use   Vaping status: Never Used  Substance Use Topics   Alcohol use: Never   Drug use: Never   Allergies Nsaids, Codeine, Lyrica [pregabalin], and Penicillins  Review of Systems Review of Systems  All other systems reviewed and are negative.   Physical Exam Vital Signs  I have reviewed the triage vital signs BP (!) 189/76 (BP Location: Right Arm)   Pulse (!) 54   Temp 97.7 F (36.5 C)   Resp 15   Wt 69.8 kg   SpO2 98%    BMI 28.15 kg/m  Physical Exam Vitals and nursing note  reviewed.  Constitutional:      General: She is not in acute distress.    Appearance: She is well-developed.  HENT:     Head: Normocephalic and atraumatic.     Mouth/Throat:     Mouth: Mucous membranes are moist.  Eyes:     Pupils: Pupils are equal, round, and reactive to light.  Cardiovascular:     Rate and Rhythm: Normal rate and regular rhythm.     Heart sounds: No murmur heard. Pulmonary:     Effort: Pulmonary effort is normal. No respiratory distress.     Breath sounds: Normal breath sounds.  Abdominal:     General: Abdomen is flat.     Palpations: Abdomen is soft.     Tenderness: There is no abdominal tenderness.  Musculoskeletal:        General: No tenderness.     Right lower leg: No edema.     Left lower leg: No edema.  Skin:    General: Skin is warm and dry.  Neurological:     Mental Status: She is alert.     Comments: Obvious right-sided facial droop, right-sided weakness, left gaze preference, aphasic  Psychiatric:        Mood and Affect: Mood normal.        Behavior: Behavior normal.     ED Results and Treatments Labs (all labs ordered are listed, but only abnormal results are displayed) Labs Reviewed  I-STAT CHEM 8, ED - Abnormal; Notable for the following components:      Result Value   BUN 4 (*)    Glucose, Bld 100 (*)    Calcium, Ion 0.97 (*)    Hemoglobin 8.5 (*)    HCT 25.0 (*)    All other components within normal limits  RESP PANEL BY RT-PCR (RSV, FLU A&B, COVID)  RVPGX2  PROTIME-INR  APTT  CBC  DIFFERENTIAL  COMPREHENSIVE METABOLIC PANEL  ETHANOL  CBG MONITORING, ED                                                                                                                          Radiology CT HEAD CODE STROKE WO CONTRAST Result Date: 07/08/2023 CLINICAL DATA:  Code stroke. Provided history: Slurred speech. Right-sided weakness. Confusion. Inability to follow commands. EXAM: CT  HEAD WITHOUT CONTRAST TECHNIQUE: Contiguous axial images were obtained from the base of the skull through the vertex without intravenous contrast. RADIATION DOSE REDUCTION: This exam was performed according to the departmental dose-optimization program which includes automated exposure control, adjustment of the mA and/or kV according to patient size and/or use of iterative reconstruction technique. COMPARISON:  Brain MRI 09/23/2022. FINDINGS: Brain: Generalized cerebral and cerebellar atrophy. Chronic infarcts within the left basal ganglia. Small chronic cortical infarct within the mid left frontal lobe (series 6, image 22). Background moderate patchy and ill-defined hypoattenuation within the cerebral white matter, nonspecific but compatible with chronic small vessel ischemic disease. There is no acute intracranial hemorrhage. No  acute demarcated cortical infarct. No extra-axial fluid collection. No evidence of an intracranial mass. No midline shift. Vascular: Atherosclerotic calcifications. Vascular stent along the course of the left middle cerebral artery M1 and proximal M2 segments. No hyperdense vessel identified elsewhere. Skull: No calvarial fracture or aggressive osseous lesion. Sinuses/Orbits: No mass or acute finding within the imaged orbits. Minimal mucosal thickening within bilateral ethmoid air cells. ASPECTS [facility] Stroke Program Early CT Score) - Ganglionic level infarction (caudate, lentiform nuclei, internal capsule, insula, M1-M3 cortex): 7 - Supraganglionic infarction (M4-M6 cortex): 3 Total score (0-10 with 10 being normal): 10 (when discounting chronic infarcts). No evidence of an acute intracranial abnormality. These results were communicated to Dr. Amada Jupiter at 6:33 pmon 3/5/2025by text page via the [facility] messaging system. IMPRESSION: 1.  No evidence of an acute intracranial abnormality. 2. Parenchymal atrophy, chronic small vessel ischemic disease and chronic infarcts, as described. 3.  Vascular stent along the course of the left middle cerebral artery M1 and M2 segments. Electronically Signed   By: Jackey Loge D.O.   On: 07/08/2023 18:37    Pertinent labs & imaging results that were available during my care of the patient were reviewed by me and considered in my medical decision making (see MDM for details).  Medications Ordered in ED Medications  sodium chloride flush (NS) 0.9 % injection 3 mL (has no administration in time range)  iohexol (OMNIPAQUE) 350 MG/ML injection 100 mL (100 mLs Intravenous Contrast Given 07/08/23 1836)                                                                                                                                     Procedures .Critical Care  Performed by: Lonell Grandchild, MD Authorized by: Lonell Grandchild, MD   Critical care provider statement:    Critical care time (minutes):  30   Critical care was necessary to treat or prevent imminent or life-threatening deterioration of the following conditions:  CNS failure or compromise   Critical care was time spent personally by me on the following activities:  Development of treatment plan with patient or surrogate, discussions with consultants, evaluation of patient's response to treatment, examination of patient, ordering and review of laboratory studies, ordering and review of radiographic studies, ordering and performing treatments and interventions, pulse oximetry, re-evaluation of patient's condition and review of old charts   Care discussed with: admitting provider     (including critical care time)  Medical Decision Making / ED Course   MDM:  78 year old presenting to the emergency department with weakness with last known normal yesterday.  Patient outside TNK window.  She was activated as a code stroke and seen by neurology on arrival.  Imaging shows signs of large vessel occlusion.  She will be taken emergently for thrombectomy.  Low concern for other process  such as dissection, intracranial bleed.        Additional history obtained: -Additional  history obtained from ems -External records from outside source obtained and reviewed including: Chart review including previous notes, labs, imaging, consultation notes including prior notes    Lab Tests: -I ordered, reviewed, and interpreted labs.   The pertinent results include:   Labs Reviewed  I-STAT CHEM 8, ED - Abnormal; Notable for the following components:      Result Value   BUN 4 (*)    Glucose, Bld 100 (*)    Calcium, Ion 0.97 (*)    Hemoglobin 8.5 (*)    HCT 25.0 (*)    All other components within normal limits  RESP PANEL BY RT-PCR (RSV, FLU A&B, COVID)  RVPGX2  PROTIME-INR  APTT  CBC  DIFFERENTIAL  COMPREHENSIVE METABOLIC PANEL  ETHANOL  CBG MONITORING, ED    Notable for anemia    Imaging Studies ordered: I ordered imaging studies including CT head On my interpretation imaging demonstrates prior stroke  I independently visualized and interpreted imaging. I agree with the radiologist interpretation   Medicines ordered and prescription drug management: Meds ordered this encounter  Medications   sodium chloride flush (NS) 0.9 % injection 3 mL   iohexol (OMNIPAQUE) 350 MG/ML injection 100 mL    -I have reviewed the patients home medicines and have made adjustments as needed   Consultations Obtained: I requested consultation with the neurologist,  and discussed lab and imaging findings as well as pertinent plan - they recommend: thrombectomy    Social Determinants of Health:  Diagnosis or treatment significantly limited by social determinants of health: obesity   Reevaluation: After the interventions noted above, I reevaluated the patient and found that their symptoms have stayed the same  Co morbidities that complicate the patient evaluation  Past Medical History:  Diagnosis Date   Anemia    Anxiety    Arthritis    "all over" (12/08/2017)   Asthma     CHF (congestive heart failure) (HCC)    Chronic lower back pain    COPD (chronic obstructive pulmonary disease) (HCC)    Dementia (HCC)    Depression    Diabetes mellitus without complication (HCC)    Fibromyalgia    Gastric ulcer    GERD (gastroesophageal reflux disease)    Guillain Barr syndrome (HCC)    Hematemesis 12/07/2017   History of blood transfusion    "low blood" (12/08/2017)   History of stomach ulcers    Hypercholesterolemia    Hypertension    Migraine    "used to have them; don't have them anymore" (12/08/2017)   Pneumonia    "probably twice" (12/08/2017)   Sleep apnea    "couldn't take the mask" (12/08/2017)   Stroke (HCC)    "I don't remember if I have had one or not; I believe I have" (12/08/2017)      Dispostion: Disposition decision including need for hospitalization was considered, and patient admitted to the hospital.    Final Clinical Impression(s) / ED Diagnoses Final diagnoses:  Acute ischemic stroke [facility])     This chart was dictated using voice recognition software.  Despite best efforts to proofread,  errors can occur which can change the documentation meaning.    Lonell Grandchild, MD 07/08/23 [phone]

## 2023-07-08 NOTE — Anesthesia Procedure Notes (Signed)
 Procedure Name: Intubation Date/Time: 07/08/2023 7:34 PM  Performed by: Sandie Ano, CRNAPre-anesthesia Checklist: Patient identified, Emergency Drugs available, Suction available and Patient being monitored Patient Re-evaluated:Patient Re-evaluated prior to induction Oxygen Delivery Method: Circle System Utilized Preoxygenation: Pre-oxygenation with 100% oxygen Induction Type: IV induction Ventilation: Mask ventilation without difficulty Laryngoscope Size: Mac and 3 Grade View: Grade I Tube type: Oral Tube size: 7.5 mm Number of attempts: 1 Airway Equipment and Method: Stylet and Oral airway Placement Confirmation: ETT inserted through vocal cords under direct vision, positive ETCO2 and breath sounds checked- equal and bilateral Secured at: 21 cm Tube secured with: Tape Dental Injury: Teeth and Oropharynx as per pre-operative assessment

## 2023-07-08 NOTE — Code Documentation (Signed)
 Stroke Response Nurse Documentation Code Documentation  Melissa [name] is a 78 y.o. female arriving to [facility]  via [facility] on 3/5 with past medical hx of CHF, dementia, COPD, CKD, CVA, CAD. On Eliquis (apixaban) daily. Code stroke was activated by EMS.   Patient from home where she was LKW at 2100 and now complaining of aphasia, right sided weakness, slurred speech. This was noted by family this morning, at which time she refused to come to the hospital.    Stroke team at the bedside on patient arrival. Labs drawn and patient cleared for CT by Dr. Suezanne Jacquet. Patient to CT with team. NIHSS 14, see documentation for details and code stroke times. Patient with disoriented, not following commands, left gaze preference , right hemianopia, right facial droop, right arm weakness, right leg weakness, Global aphasia , and dysarthria  on exam. The following imaging was completed:  CT Head, CTA, and CTP. Patient is not a candidate for IV Thrombolytic due to LKW yesterday. Patient is a candidate for IR due to L MCA occlusion.   Care Plan: IR.   Bedside handoff with IR RN Sherrie.    Pearlie Oyster  Stroke Response RN

## 2023-07-09 ENCOUNTER — Inpatient Hospital Stay (HOSPITAL_COMMUNITY)

## 2023-07-09 ENCOUNTER — Telehealth (HOSPITAL_COMMUNITY): Payer: Self-pay

## 2023-07-09 ENCOUNTER — Other Ambulatory Visit (HOSPITAL_COMMUNITY): Payer: Self-pay

## 2023-07-09 ENCOUNTER — Encounter (HOSPITAL_COMMUNITY): Payer: Self-pay | Admitting: Radiology

## 2023-07-09 DIAGNOSIS — I6389 Other cerebral infarction: Secondary | ICD-10-CM

## 2023-07-09 DIAGNOSIS — I63512 Cerebral infarction due to unspecified occlusion or stenosis of left middle cerebral artery: Secondary | ICD-10-CM | POA: Diagnosis not present

## 2023-07-09 LAB — LIPID PANEL
Cholesterol: 128 mg/dL (ref 0–200)
HDL: 48 mg/dL (ref >40)
LDL Cholesterol: 68 mg/dL (ref 0–99)
Total CHOL/HDL Ratio: 2.7 ratio
Triglycerides: 59 mg/dL (ref <150)
VLDL: 12 mg/dL (ref 0–40)

## 2023-07-09 LAB — ECHOCARDIOGRAM COMPLETE
AR max vel: 2.28 cm2
AV Area VTI: 2.3 cm2
AV Area mean vel: 2.34 cm2
AV Mean grad: 6 mmHg
AV Peak grad: 11.1 mmHg
Ao pk vel: 1.66 m/s
Area-P 1/2: 3.42 cm2
S' Lateral: 2.8 cm
Weight: 2462.1 [oz_av]

## 2023-07-09 LAB — HEMOGLOBIN A1C
Hgb A1c MFr Bld: 5.7 % — ABNORMAL HIGH (ref 4.8–5.6)
Mean Plasma Glucose: 116.89 mg/dL

## 2023-07-09 LAB — CBC
HCT: 21.7 % — ABNORMAL LOW (ref 36.0–46.0)
Hemoglobin: 6.4 g/dL — CL (ref 12.0–15.0)
MCH: 21.4 pg — ABNORMAL LOW (ref 26.0–34.0)
MCHC: 29.5 g/dL — ABNORMAL LOW (ref 30.0–36.0)
MCV: 72.6 fL — ABNORMAL LOW (ref 80.0–100.0)
Platelets: 368 10*3/uL (ref 150–400)
RBC: 2.99 MIL/uL — ABNORMAL LOW (ref 3.87–5.11)
RDW: 16.4 % — ABNORMAL HIGH (ref 11.5–15.5)
WBC: 7 10*3/uL (ref 4.0–10.5)
nRBC: 0 % (ref 0.0–0.2)

## 2023-07-09 LAB — COMPREHENSIVE METABOLIC PANEL
ALT: 12 U/L (ref 0–44)
AST: 19 U/L (ref 15–41)
Albumin: 3.1 g/dL — ABNORMAL LOW (ref 3.5–5.0)
Alkaline Phosphatase: 61 U/L (ref 38–126)
Anion gap: 10 (ref 5–15)
BUN: <5 mg/dL — ABNORMAL LOW (ref 8–23)
CO2: 22 mmol/L (ref 22–32)
Calcium: 8.6 mg/dL — ABNORMAL LOW (ref 8.9–10.3)
Chloride: 105 mmol/L (ref 98–111)
Creatinine, Ser: 0.83 mg/dL (ref 0.44–1.00)
GFR, Estimated: >60 mL/min (ref >60)
Glucose, Bld: 110 mg/dL — ABNORMAL HIGH (ref 70–99)
Potassium: 3.8 mmol/L (ref 3.5–5.1)
Sodium: 137 mmol/L (ref 135–145)
Total Bilirubin: 1 mg/dL (ref 0.0–1.2)
Total Protein: 5.8 g/dL — ABNORMAL LOW (ref 6.5–8.1)

## 2023-07-09 LAB — DIFFERENTIAL
Abs Immature Granulocytes: 0.02 10*3/uL (ref 0.00–0.07)
Basophils Absolute: 0 10*3/uL (ref 0.0–0.1)
Basophils Relative: 0 %
Eosinophils Absolute: 0.1 10*3/uL (ref 0.0–0.5)
Eosinophils Relative: 1 %
Immature Granulocytes: 0 %
Lymphocytes Relative: 8 %
Lymphs Abs: 0.5 10*3/uL — ABNORMAL LOW (ref 0.7–4.0)
Monocytes Absolute: 0.5 10*3/uL (ref 0.1–1.0)
Monocytes Relative: 7 %
Neutro Abs: 5.8 10*3/uL (ref 1.7–7.7)
Neutrophils Relative %: 84 %

## 2023-07-09 LAB — APTT: aPTT: 25 s (ref 24–36)

## 2023-07-09 LAB — HEMOGLOBIN AND HEMATOCRIT, BLOOD
HCT: 21.6 % — ABNORMAL LOW (ref 36.0–46.0)
HCT: 24.5 % — ABNORMAL LOW (ref 36.0–46.0)
Hemoglobin: 6.4 g/dL — CL (ref 12.0–15.0)
Hemoglobin: 7.6 g/dL — ABNORMAL LOW (ref 12.0–15.0)

## 2023-07-09 LAB — PROTIME-INR
INR: 1 (ref 0.8–1.2)
Prothrombin Time: 13.7 s (ref 11.4–15.2)

## 2023-07-09 LAB — MRSA NEXT GEN BY PCR, NASAL: MRSA by PCR Next Gen: NOT DETECTED

## 2023-07-09 LAB — PREPARE RBC (CROSSMATCH)

## 2023-07-09 LAB — MAGNESIUM: Magnesium: 1.6 mg/dL — ABNORMAL LOW (ref 1.7–2.4)

## 2023-07-09 LAB — ETHANOL: Alcohol, Ethyl (B): <10 mg/dL (ref <10)

## 2023-07-09 LAB — PHOSPHORUS: Phosphorus: 3.1 mg/dL (ref 2.5–4.6)

## 2023-07-09 MED ORDER — CLEVIDIPINE BUTYRATE 0.5 MG/ML IV EMUL
0.0000 mg/h | INTRAVENOUS | Status: AC
  Administered 2023-07-09: 8 mg/h via INTRAVENOUS
  Administered 2023-07-09: 12 mg/h via INTRAVENOUS
  Filled 2023-07-09 (×2): qty 100
  Filled 2023-07-09: qty 50

## 2023-07-09 MED ORDER — ACETAMINOPHEN 325 MG PO TABS: 650.0000 mg | ORAL_TABLET | ORAL | Status: DC | PRN

## 2023-07-09 MED ORDER — ORAL CARE MOUTH RINSE: 15.0000 mL | OROMUCOSAL | Status: DC | PRN

## 2023-07-09 MED ORDER — CHLORHEXIDINE GLUCONATE CLOTH 2 % EX PADS
6.0000 | MEDICATED_PAD | Freq: Every day | CUTANEOUS | Status: DC
  Administered 2023-07-09 – 2023-07-12 (×4): 6 via TOPICAL

## 2023-07-09 MED ORDER — TICAGRELOR 90 MG PO TABS
90.0000 mg | ORAL_TABLET | Freq: Two times a day (BID) | ORAL | Status: DC
  Administered 2023-07-09: 90 mg
  Filled 2023-07-09: qty 1

## 2023-07-09 MED ORDER — VITAL HIGH PROTEIN PO LIQD: 1000.0000 mL | ORAL | Status: DC

## 2023-07-09 MED ORDER — ROSUVASTATIN CALCIUM 20 MG PO TABS
40.0000 mg | ORAL_TABLET | Freq: Every day | ORAL | Status: DC
  Administered 2023-07-09 – 2023-07-10 (×2): 40 mg
  Filled 2023-07-09 (×2): qty 2

## 2023-07-09 MED ORDER — APIXABAN 5 MG PO TABS: 5.0000 mg | ORAL_TABLET | Freq: Two times a day (BID) | ORAL | Status: DC

## 2023-07-09 MED ORDER — SODIUM CHLORIDE 0.9% IV SOLUTION
Freq: Once | INTRAVENOUS | Status: DC
Start: 2023-07-09 — End: 2023-07-15

## 2023-07-09 MED ORDER — ACETAMINOPHEN 650 MG RE SUPP: 650.0000 mg | RECTAL | Status: DC | PRN

## 2023-07-09 MED ORDER — ASPIRIN 81 MG PO CHEW
81.0000 mg | CHEWABLE_TABLET | Freq: Every day | ORAL | Status: DC
  Administered 2023-07-09 – 2023-07-10 (×2): 81 mg
  Filled 2023-07-09 (×2): qty 1

## 2023-07-09 MED ORDER — ACETAMINOPHEN 160 MG/5ML PO SOLN: 650.0000 mg | ORAL | Status: DC | PRN

## 2023-07-09 MED ORDER — PROSOURCE TF20 ENFIT COMPATIBL EN LIQD
60.0000 mL | Freq: Every day | ENTERAL | Status: DC
  Administered 2023-07-09 – 2023-07-10 (×2): 60 mL
  Filled 2023-07-09 (×2): qty 60

## 2023-07-09 MED ORDER — SODIUM CHLORIDE 0.9 % IV SOLN: INTRAVENOUS | Status: DC

## 2023-07-09 MED ORDER — OSMOLITE 1.5 CAL PO LIQD
1000.0000 mL | ORAL | Status: DC
  Administered 2023-07-09: 1000 mL
  Filled 2023-07-09: qty 1000

## 2023-07-09 MED ORDER — APIXABAN 5 MG PO TABS
5.0000 mg | ORAL_TABLET | Freq: Two times a day (BID) | ORAL | Status: DC
Start: 2023-07-09 — End: 2023-07-09

## 2023-07-09 MED ORDER — TICAGRELOR 90 MG PO TABS: 90.0000 mg | ORAL_TABLET | Freq: Two times a day (BID) | ORAL | Status: DC

## 2023-07-09 MED ORDER — ASPIRIN 81 MG PO CHEW
81.0000 mg | CHEWABLE_TABLET | Freq: Every day | ORAL | Status: DC
  Administered 2023-07-11 – 2023-07-12 (×2): 81 mg via ORAL
  Filled 2023-07-09 (×2): qty 1

## 2023-07-09 NOTE — Plan of Care (Addendum)
 Critical Hgb of 6.4. Repeat draw verifies low Hgb of 6.4.   Patient is awake, conversant and not complaining of any pain. No evidence of hematoma at right groin site.   The patient verbalized consent to administration of blood products.   Type and cross with 2 U PRBCs ordered.   Stool guiac ordered.   Redraw Hgb in 6 hours.   Will need to continue her Brilinta to prevent in-stent restenosis. Benefits significantly outweigh risks.   Electronically signed: Dr. Caryl Pina

## 2023-07-09 NOTE — Progress Notes (Signed)
 6 am assessment of patient she was unable to lift right upper arm off the bed. Previously able to hold both arms up with no drift. Pt also disoriented to place and time. Dr. Otelia Limes paged and pt taken down for a STAT head with RN

## 2023-07-09 NOTE — Progress Notes (Signed)
 PT Cancellation Note  Patient Details Name: Melissa [name] MRN: [id number] DOB: 11-16-1945   Cancelled Treatment:    Reason Eval/Treat Not Completed: Active bedrest order placed this morning after bleeding from groin site. Pt placed on bedrest until 17:22 today. PT will continue to follow and evaluate as appropriate.   Vickki Muff, PT, DPT   Acute Rehabilitation Department Office [phone] Secure Chat Communication Preferred   Ronnie Derby 07/09/2023, 11:52 AM

## 2023-07-09 NOTE — Progress Notes (Signed)
 Referring Physician(s): CODE STROKE  Supervising Physician: Julieanne Cotton  Patient Status:  [facility] - In-pt  Chief Complaint: Near complete occlusion of the stented distal left MCA M1 segment extending into the superior division s/p complete revascularization of the IntraStent distal MCA occlusion with 1 pass with an 062/043 contact aspiration achieving a TICI 3 revascularization.    Subjective: Patient awake/alert in bed. She denies pain or discomfort. She is able to converse easily and move all extremities. Some right upper extremity weakness.   Allergies: Nsaids, Codeine, Lyrica [pregabalin], and Penicillins  Medications: Prior to Admission medications   Medication Sig Start Date End Date Taking? Authorizing Provider  albuterol (VENTOLIN HFA) 108 (90 Base) MCG/ACT inhaler Inhale 2 puffs into the lungs every 6 (six) hours as needed for wheezing or shortness of breath.    [provider]  apixaban (ELIQUIS) 5 MG TABS tablet Take 1 tablet (5 mg total) by mouth 2 (two) times daily. 09/23/22   Lynnae January, NP  aspirin 81 MG chewable tablet Chew 81 mg by mouth daily. 06/24/23 07/24/23  [provider]  cefdinir (OMNICEF) 300 MG capsule Take 300 mg by mouth 2 (two) times daily. 07/02/23   [provider]  docusate sodium (COLACE) 100 MG capsule Take 1 capsule (100 mg total) by mouth daily. 09/24/22   Lynnae January, NP  ferrous sulfate 325 (65 FE) MG tablet Take 1 tablet (325 mg total) by mouth daily. Patient taking differently: Take 325 mg by mouth daily with breakfast. 12/09/17 09/20/22  Tyrone Nine, MD  hydrOXYzine (ATARAX) 10 MG tablet Take 10 mg by mouth 2 (two) times daily. 06/15/23   [provider]  lisinopril (ZESTRIL) 5 MG tablet Take 1 tablet (5 mg total) by mouth daily. 09/24/22   Lynnae January, NP  losartan (COZAAR) 50 MG tablet Take 50 mg by mouth daily. 06/24/23 07/24/23  [provider]  metroNIDAZOLE (FLAGYL) 500 MG tablet Take 500  mg by mouth 3 (three) times daily. 07/02/23   [provider]  omeprazole (PRILOSEC) 40 MG capsule Take 40 mg by mouth daily before breakfast. 07/07/22   [provider]  rosuvastatin (CRESTOR) 40 MG tablet Take 1 tablet (40 mg total) by mouth daily. 09/24/22   Lynnae January, NP  sertraline (ZOLOFT) 25 MG tablet Take 25 mg by mouth daily. 06/15/23   [provider]  ticagrelor (BRILINTA) 90 MG TABS tablet Take 0.5 tablets (45 mg total) by mouth 2 (two) times daily. 09/23/22   Lynnae January, NP  TYLENOL 500 MG tablet Take 500-1,000 mg by mouth every 6 (six) hours as needed for mild pain or headache.    [provider]     Vital Signs: BP (!) 155/56   Pulse 60   Temp 97.7 F (36.5 C) (Axillary)   Resp 15   Wt 153 lb 14.1 oz (69.8 kg)   SpO2 97%   BMI 28.15 kg/m   Physical Exam Constitutional:      General: She is not in acute distress.    Appearance: She is not ill-appearing.  Cardiovascular:     Pulses: Normal pulses.     Comments: Right groin vascular site is clean/soft/dry and minimally tender to palpation.  Pulmonary:     Effort: Pulmonary effort is normal.  Abdominal:     Tenderness: There is no abdominal tenderness.  Skin:    General: Skin is warm and dry.  Neurological:     Mental Status:  She is alert and oriented to person, place, and time.     Comments: Able to move all extremities. No dysarthria. Right upper extremity weakness but she is still able to move/lift arm.      Imaging: CT HEAD WO CONTRAST ( ) Result Date: 07/09/2023 CLINICAL DATA:  78 year old female recurrent code stroke presentation on 07/08/2023, left MCA M1 stent thrombus. Status post endovascular revascularization. Punctate left hemisphere infarcts on post treatment MRI. EXAM: CT HEAD WITHOUT CONTRAST TECHNIQUE: Contiguous axial images were obtained from the base of the skull through the vertex without intravenous contrast. RADIATION DOSE REDUCTION: This exam was  performed according to the departmental dose-optimization program which includes automated exposure control, adjustment of the mA and/or kV according to patient size and/or use of iterative reconstruction technique. COMPARISON:  Brain MRI yesterday and earlier. FINDINGS: Brain: No acute intracranial hemorrhage identified. No midline shift, mass effect, or evidence of intracranial mass lesion. No ventriculomegaly. Gray-white differentiation appears stable by CT since presentation yesterday with chronic ischemic changes in the left MCA territory, confluent chronic cerebral white matter disease. Subtle diffusion abnormality by MRI is not correlated. And no new cortically based infarct is identified. Vascular: Extensive Calcified atherosclerosis at the skull base. Left MCA stent redemonstrated. Skull: Stable, intact. Sinuses/Orbits: Left nasoenteric tube now in place. Visualized paranasal sinuses and mastoids are stable and well aerated. Other: No acute orbit or scalp soft tissue finding. IMPRESSION: No acute intracranial hemorrhage and subtle Left MCA territory ischemia on MRI is occult by CT. Stable chronic white matter and ischemic disease. No new intracranial abnormality. Electronically Signed   By: Odessa Fleming M.D.   On: 07/09/2023 06:28   DG Abd 1 View Result Date: 07/09/2023 CLINICAL DATA:  [id number].  Encounter for orogastric tube placement. EXAM: ABDOMEN - 1 VIEW COMPARISON:  Chest, abdomen and pelvis CT 06/29/2023 FINDINGS: There is a large hiatal hernia with about half of the stomach intrathoracic. NGT is in place and coiled within the herniated portion of the stomach. There is mild dilatation of some of the left abdominal small bowel segments up to 3.3 cm which could be due to ileus or partial small bowel obstruction. Gas and stool are present in the colon at least to the proximal descending segment. There are cholecystectomy clips. No supine evidence of free air. Lung bases are clear. Cardiomegaly. IMPRESSION:  1. Large hiatal hernia with about half of the stomach intrathoracic. 2. NGT is in place and coiled within the herniated portion of the stomach. 3. Mild dilatation of some of the left abdominal small bowel segments up to 3.3 cm which could be due to ileus or partial small bowel obstruction. Electronically Signed   By: Almira Bar M.D.   On: 07/09/2023 06:12   MR BRAIN WO CONTRAST Result Date: 07/09/2023 CLINICAL DATA:  Stroke, follow up EXAM: MRI HEAD WITHOUT CONTRAST TECHNIQUE: Multiplanar, multiecho pulse sequences of the brain and surrounding structures were obtained without intravenous contrast. COMPARISON:  MRI head May 21 24. FINDINGS: Brain: Small acute infarct in the left insula and a few punctate acute infarcts in the left frontal lobe. No significant mass effect. Remote left basal ganglia infarcts. Patchy additional T2/FLAIR hyperintensities in the white matter, compatible with chronic microvascular ischemic change. No midline shift. No hydrocephalus. No mass lesion. Vascular: Major arterial flow voids are maintained at the skull base. Stented left MCA. Skull and upper cervical spine: Normal marrow signal. Sinuses/Orbits: Clear sinuses.  No acute orbital findings. Other: No mastoid effusions. IMPRESSION: Small acute  infarct in the left insula and a few punctate acute infarcts in the left frontal lobe Electronically Signed   By: Feliberto Harts M.D.   On: 07/09/2023 01:53   CT ANGIO HEAD NECK W WO CM W PERF (CODE STROKE) Result Date: 07/08/2023 CLINICAL DATA:  Provided history: Neuro deficit, acute, stroke suspected. Slurred speech. Right-sided weakness. Confusion. Unable to follow commands. EXAM: CT ANGIOGRAPHY HEAD AND NECK CT PERFUSION BRAIN TECHNIQUE: Multidetector CT imaging of the head and neck was performed using the standard protocol during bolus administration of intravenous contrast. Multiplanar CT image reconstructions and MIPs were obtained to evaluate the vascular anatomy. Carotid  stenosis measurements (when applicable) are obtained utilizing NASCET criteria, using the distal internal carotid diameter as the denominator. Multiphase CT imaging of the brain was performed following IV bolus contrast injection. Subsequent parametric perfusion maps were calculated using RAPID software. RADIATION DOSE REDUCTION: This exam was performed according to the departmental dose-optimization program which includes automated exposure control, adjustment of the mA and/or kV according to patient size and/or use of iterative reconstruction technique. CONTRAST:  OMNIPAQUE IOHEXOL 350 MG/ML SOLN COMPARISON:  Non-contrast head CT performed earlier today 07/08/2023. CTA head/neck 09/19/2022. Brain MRI 09/23/2022. CT chest/abdomen/pelvis 06/29/2023. FINDINGS: CTA NECK FINDINGS Aortic arch: Standard aortic branching. Atherosclerotic plaque within the visualized thoracic aorta and proximal major branch vessels of the neck. No hemodynamically significant innominate or proximal subclavian artery stenosis. Right carotid system: CCA and ICA patent within the neck without hemodynamically significant stenosis (50% or greater). Atherosclerotic plaque at the CCA origin and about the carotid bifurcation. Left carotid system: CCA and ICA patent within the neck without hemodynamically significant stenosis (50% or greater). Atherosclerotic plaque at the CCA origin, scattered elsewhere within the CCA and about the carotid bifurcation. Tortuosity of the cervical ICA. Mild nonspecific fusiform dilation of the mid-to-distal cervical ICA, unchanged from the prior CTA of 09/19/2022. Vertebral arteries: The vertebral arteries are patent within the neck without stenosis or significant atherosclerotic disease. The left vertebral artery is slightly dominant. Skeleton: Spondylosis of the cervical and visualized upper thoracic levels. The patient is edentulous. Other neck: No neck mass or cervical lymphadenopathy. Upper chest: 14 mm  part-solid right upper lobe pulmonary nodule as was demonstrated on the recent prior CT chest/abdomen/pelvis of 09/19/2022 (series 7, image 391). Review of the MIP images confirms the above findings CTA HEAD FINDINGS Anterior circulation: The intracranial internal carotid arteries are patent. Atherosclerotic plaque within both vessels with no more than mild stenosis. A vascular stent is present within the M1 and M2 left middle cerebral artery. There is a subocclusive filling defect within the stented portion of the distal M1 left middle cerebral artery consistent with thrombus (for instance as seen on series 9, image 166) (series 7, image 132). This results in a severe stenosis. No left M2 proximal branch occlusion is identified. Severe stenosis within an M3 left MCA vessel (series 13, image 29). The right middle cerebral artery M1 segment is patent. No right M2 proximal branch occlusion or high-grade proximal arterial stenosis. The anterior cerebral arteries are patent. 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 Posterior circulation: The intracranial vertebral arteries are patent. Mild atherosclerotic irregularity of the V4 right vertebral artery. The basilar artery is patent. The posterior cerebral arteries are patent. A left posterior communicating artery is present. The right posterior communicating artery is diminutive  or absent. Venous sinuses: Within the limitations of contrast timing, no convincing thrombus. Anatomic variants: As described. Review of the MIP images confirms the above findings CT Brain Perfusion Findings: CBF (<30%) Volume: 8mL Perfusion (Tmax>6.0s) volume: 56mL Mismatch Volume: 48mL Infarction Location:Left MCA vascular territory. CTA head impressions #1 and #2 These results were called by telephone at the time of interpretation on  07/08/2023 at 6:41 pm to provider MC[facility] , who verbally acknowledged these results. IMPRESSION: CTA neck: 1. The common carotid and internal carotid arteries are patent within the neck without hemodynamically significant stenosis. Atherosclerotic plaque bilaterally, as described. 2. Vertebral arteries patent within the neck without stenosis or significant atherosclerotic disease. 3. Aortic Atherosclerosis (ICD10-I70.0). 4. 14 mm part-solid right upper lobe pulmonary nodule as was demonstrated on the recent prior CT chest/abdomen/pelvis of 06/29/2023 (series 7, image 391). Consider one of the following: a follow-up non-contrast chest CT 3 months from the previous examination of 06/29/2023, a follow-up PET-CT or tissue sampling. This recommendation follows the consensus statement: Guidelines for Management of Incidental Pulmonary Nodules Detected on CT Images: From the Fleischner Society 2017; Radiology 2017; 284:228-243. CTA head: 1. Subocclusive filling defect (consistent with thrombus) within the stented portion of the left middle cerebral artery M1 segment. Resultant severe stenosis at this site. 2. Severe stenosis within a left middle cerebral artery M3 segment vessel. 3. Background intracranial sclerotic disease as described. 4. 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 CT perfusion head: The perfusion software identifies an 8 mL core infarct within the left MCA vascular territory. The perfusion software identifies a 56 mL region of critically hypoperfused parenchyma within the left MCA vascular territory (utilizing the Tmax>6 seconds threshold.). Reported mismatch volume: 48 mL. Electronically Signed   By: Jackey Loge D.O.   On: 07/08/2023 19:18   CT HEAD CODE STROKE WO CONTRAST Result Date: 07/08/2023 CLINICAL DATA:  Code stroke. Provided history: Slurred speech. Right-sided weakness. Confusion. Inability to  follow commands. EXAM: CT HEAD WITHOUT CONTRAST TECHNIQUE: Contiguous axial images were obtained from the base of the skull through the vertex without intravenous contrast. RADIATION DOSE REDUCTION: This exam was performed according to the departmental dose-optimization program which includes automated exposure control, adjustment of the mA and/or kV according to patient size and/or use of iterative reconstruction technique. COMPARISON:  Brain MRI 09/23/2022. FINDINGS: Brain: Generalized cerebral and cerebellar atrophy. Chronic infarcts within the left basal ganglia. Small chronic cortical infarct within the mid left frontal lobe (series 6, image 22). Background moderate patchy and ill-defined hypoattenuation within the cerebral white matter, nonspecific but compatible with chronic small vessel ischemic disease. There is no acute intracranial hemorrhage. No acute demarcated cortical infarct. No extra-axial fluid collection. No evidence of an intracranial mass. No midline shift. Vascular: Atherosclerotic calcifications. Vascular stent along the course of the left middle cerebral artery M1 and proximal M2 segments. No hyperdense vessel identified elsewhere. Skull: No calvarial fracture or aggressive osseous lesion. Sinuses/Orbits: No mass or acute finding within the imaged orbits. Minimal mucosal thickening within bilateral ethmoid air cells. ASPECTS [facility] Stroke Program Early CT Score) - Ganglionic level infarction (caudate, lentiform nuclei, internal capsule, insula, M1-M3 cortex): 7 - Supraganglionic infarction (M4-M6 cortex): 3 Total score (0-10 with 10 being normal): 10 (when discounting chronic infarcts). No evidence of an acute intracranial abnormality. These results were communicated to Dr. Amada Jupiter at 6:33 pmon 3/5/2025by text page via the [facility] messaging system. IMPRESSION: 1.  No evidence  of an acute intracranial abnormality. 2. Parenchymal atrophy, chronic small vessel ischemic disease and chronic  infarcts, as described. 3. Vascular stent along the course of the left middle cerebral artery M1 and M2 segments. Electronically Signed   By: Jackey Loge D.O.   On: 07/08/2023 18:37    Labs:  CBC: Recent Labs    09/20/22 0504 09/20/22 0514 09/21/22 0309 09/23/22 0630 07/08/23 1814 07/09/23 0124 07/09/23 0227  WBC 9.7  --  7.8 5.7  --  7.0  --   HGB 10.0*   < > 8.7* 9.0* 8.5* 6.4* 6.4*  HCT 32.6*   < > 28.5* 28.9* 25.0* 21.7* 21.6*  PLT 330  --  290 276  --  368  --    < > = values in this interval not displayed.    COAGS: Recent Labs    09/20/22 0504 07/09/23 0124  INR 1.1 1.0  APTT 78* 25    BMP: Recent Labs    09/20/22 0504 09/20/22 0514 09/20/22 1220 09/21/22 0309 07/08/23 1814 07/09/23 0124  NA 135   < > 139 135 138 137  K 3.4*   < > 3.9 4.6 3.7 3.8  CL 103  --   --  107 107 105  CO2 21*  --   --  19*  --  22  GLUCOSE 170*  --   --  122* 100* 110*  BUN 5*  --   --  14 4* <5*  CALCIUM 8.8*  --   --  8.8*  --  8.6*  CREATININE 0.95  --   --  1.02* 1.00 0.83  GFRNONAA >60  --   --  56*  --  >60   < > = values in this interval not displayed.    LIVER FUNCTION TESTS: Recent Labs    09/20/22 0504 07/09/23 0124  BILITOT 0.8 1.0  AST 18 19  ALT 15 12  ALKPHOS 97 61  PROT 6.5 5.8*  ALBUMIN 3.5 3.1*    Assessment and Plan:  Near complete occlusion of the stented distal left MCA M1 segment extending into the superior division s/p complete revascularization of the IntraStent distal MCA occlusion with 1 pass with an 062/043 contact aspiration achieving a TICI 3 revascularization.   Patient extubated post-procedure. Post-procedure CT head negative for intracranial hemorrhage. She did experience some worsening right-sided weakness post-procedure and MRI brain showed no acute findings. Her hemoglobin dropped overnight to 6.4 and she received 2 units PRBCs.    Patient is awake/alert this morning and is oriented to person, place and situation. She did not  know the year. She is able to move all extremities. She does have some right upper extremity weakness.   She received aspirin and brilinta this morning via NG tube.   ** Addendum - patient developed significant bleeding from right groin site at approximately 11:45 am with approximate blood loss of 500 ml. ICU nurses held pressure and achieved hemostasis. Patient currently had blood transfusing. H&H ordered for post-transfusion. I assessed the patient at the bedside and groin site was covered in gauze. Site was    Neuro IR will continue to follow.   Electronically Signed: Alwyn Ren, AGACNP-BC 07/09/2023, 2:54 PM   I spent a total of 15 Minutes at the the patient's bedside AND on the patient's hospital floor or unit, greater than 50% of which was counseling/coordinating care for left MCA occlusion.

## 2023-07-09 NOTE — Progress Notes (Signed)
 PHARMACY - ANTICOAGULATION CONSULT NOTE  Pharmacy Consult for eliquis Indication: atrial fibrillation  Allergies  Allergen Reactions   Nsaids Other (See Comments)    CAN TAKE ONLY TYLENOL- NOTHING ELSE!!!!!!   Codeine Nausea And Vomiting and Other (See Comments)    GI Intolerance   Lyrica [Pregabalin] Other (See Comments)    Reaction not known at this time   Penicillins Other (See Comments)    Childhood allergy- exact reaction not noted    Patient Measurements: Weight: 69.8 kg (153 lb 14.1 oz)   Vital Signs: Temp: 97.7 F (36.5 C) (03/06 1015) Temp Source: Axillary (03/06 1015) BP: 153/68 (03/06 1100) Pulse Rate: 72 (03/06 1100)  Labs: Recent Labs    07/08/23 1814 07/09/23 0124 07/09/23 0227  HGB 8.5* 6.4* 6.4*  HCT 25.0* 21.7* 21.6*  PLT  --  368  --   APTT  --  25  --   LABPROT  --  13.7  --   INR  --  1.0  --   CREATININE 1.00 0.83  --     CrCl cannot be calculated (Unknown ideal weight.).   Medical History: Past Medical History:  Diagnosis Date   Anemia    Anxiety    Arthritis    "all over" (12/08/2017)   Asthma    CHF (congestive heart failure) (HCC)    Chronic lower back pain    COPD (chronic obstructive pulmonary disease) (HCC)    Dementia (HCC)    Depression    Diabetes mellitus without complication (HCC)    Fibromyalgia    Gastric ulcer    GERD (gastroesophageal reflux disease)    Guillain Barr syndrome (HCC)    Hematemesis 12/07/2017   History of blood transfusion    "low blood" (12/08/2017)   History of stomach ulcers    Hypercholesterolemia    Hypertension    Migraine    "used to have them; don't have them anymore" (12/08/2017)   Pneumonia    "probably twice" (12/08/2017)   Sleep apnea    "couldn't take the mask" (12/08/2017)   Stroke (HCC)    "I don't remember if I have had one or not; I believe I have" (12/08/2017)     Assessment: 77YOF with PMH left MCA infarct May 2024, Afib on Eliquis, DM, HLD, HTN admitted with new stroke.  Previously documented PTA medications of Brillinta and Eliquis for previous CVA+ stent. Patient reports adherence to Eliquis however INR 1.0 on admission, which would normally be elevated in the setting of recent DOAC administration. Copays for Brillinta and Eliquis per patient advocate team = $0. Pharmacy consulted to restart Eliquis for afib.  aPTT 25, INR 1, Hgb 8.5> 6.4 down, Plt 368 WNL Received 2u PRBCs this morning, significant bleeding reported from groin this AM that has improved per RN, but sending 6h H/H s/p PRBCs now Discussed further with Dr. Roda Shutters and RN, will hold Eliquis tonight and will continue to reassess bleeding. Will start Eliquis when bleeding stablized. ASA ok'd to continue.   Plan:  Will hold Eliquis 5 BID until bleeding stabilized, will continue to follow Monitor CBC and for signs/symptoms of bleeding   Stephenie Acres, PharmD PGY1 Pharmacy Resident 07/09/2023 12:54 PM

## 2023-07-09 NOTE — Progress Notes (Signed)
 Attempted to call son in chart to consent for blood but no answer and voicemail not set up. Pt continues to NOT be AOx4 making her unable to consent. Will continue to call throughout the night.

## 2023-07-09 NOTE — Telephone Encounter (Deleted)
 Pharmacy Patient Advocate Encounter  Insurance verification completed.    The patient is insured through [employer].     Ran test claim for Eliquis 5 mg  and the current 30 day co-pay is $297.00 due to deductible.   This test claim was processed through [facility]- copay amounts may vary at other pharmacies due to pharmacy/plan contracts, or as the patient moves through the different stages of their insurance plan.

## 2023-07-09 NOTE — Progress Notes (Signed)
 STROKE TEAM PROGRESS NOTE   SUBJECTIVE (INTERVAL HISTORY) No family is at the bedside.  Pt lying in bed, slightly drowsy but with continued stimulation pt was able to keep eyes open. Still has right hemiparesis. According to pharmacy, she did not fill his eliquis or brilinta but pt said she was taking those.    OBJECTIVE Temp:  [97 F (36.1 C)-98.3 F (36.8 C)] 97.7 F (36.5 C) (03/06 1015) Pulse Rate:  [44-72] 72 (03/06 1100) Cardiac Rhythm: Atrial fibrillation (03/06 0800) Resp:  [12-27] 22 (03/06 1100) BP: (104-189)/(45-134) 153/68 (03/06 1100) SpO2:  [88 %-100 %] 97 % (03/06 1100) Arterial Line BP: (114-212)/(48-107) 141/58 (03/06 1000) Weight:  [69.8 kg] 69.8 kg (03/05 1812)  No results for input(s): "GLUCAP" in the last 168 hours. Recent Labs  Lab 07/08/23 1814 07/09/23 0124  NA 138 137  K 3.7 3.8  CL 107 105  CO2  --  22  GLUCOSE 100* 110*  BUN 4* <5*  CREATININE 1.00 0.83  CALCIUM  --  8.6*   Recent Labs  Lab 07/09/23 0124  AST 19  ALT 12  ALKPHOS 61  BILITOT 1.0  PROT 5.8*  ALBUMIN 3.1*   Recent Labs  Lab 07/08/23 1814 07/09/23 0124 07/09/23 0227  WBC  --  7.0  --   NEUTROABS  --  5.8  --   HGB 8.5* 6.4* 6.4*  HCT 25.0* 21.7* 21.6*  MCV  --  72.6*  --   PLT  --  368  --    No results for input(s): "CKTOTAL", "CKMB", "CKMBINDEX", "TROPONINI" in the last 168 hours. Recent Labs    07/09/23 0124  LABPROT 13.7  INR 1.0   No results for input(s): "COLORURINE", "LABSPEC", "PHURINE", "GLUCOSEU", "HGBUR", "BILIRUBINUR", "KETONESUR", "PROTEINUR", "UROBILINOGEN", "NITRITE", "LEUKOCYTESUR" in the last 72 hours.  Invalid input(s): "APPERANCEUR"     Component Value Date/Time   CHOL 128 07/09/2023 0124   TRIG 59 07/09/2023 0124   HDL 48 07/09/2023 0124   CHOLHDL 2.7 07/09/2023 0124   VLDL 12 07/09/2023 0124   LDLCALC 68 07/09/2023 0124   Lab Results  Component Value Date   HGBA1C 5.7 (H) 07/09/2023   No results found for: "LABOPIA",  "COCAINSCRNUR", "LABBENZ", "AMPHETMU", "THCU", "LABBARB"  Recent Labs  Lab 07/09/23 0124  ETH <10    I have personally reviewed the radiological images below and agree with the radiology interpretations.  CT HEAD WO CONTRAST ( ) Result Date: 07/09/2023 CLINICAL DATA:  78 year old female recurrent code stroke presentation on 07/08/2023, left MCA M1 stent thrombus. Status post endovascular revascularization. Punctate left hemisphere infarcts on post treatment MRI. EXAM: CT HEAD WITHOUT CONTRAST TECHNIQUE: Contiguous axial images were obtained from the base of the skull through the vertex without intravenous contrast. RADIATION DOSE REDUCTION: This exam was performed according to the departmental dose-optimization program which includes automated exposure control, adjustment of the mA and/or kV according to patient size and/or use of iterative reconstruction technique. COMPARISON:  Brain MRI yesterday and earlier. FINDINGS: Brain: No acute intracranial hemorrhage identified. No midline shift, mass effect, or evidence of intracranial mass lesion. No ventriculomegaly. Gray-white differentiation appears stable by CT since presentation yesterday with chronic ischemic changes in the left MCA territory, confluent chronic cerebral white matter disease. Subtle diffusion abnormality by MRI is not correlated. And no new cortically based infarct is identified. Vascular: Extensive Calcified atherosclerosis at the skull base. Left MCA stent redemonstrated. Skull: Stable, intact. Sinuses/Orbits: Left nasoenteric tube now in place. Visualized paranasal sinuses and mastoids  are stable and well aerated. Other: No acute orbit or scalp soft tissue finding. IMPRESSION: No acute intracranial hemorrhage and subtle Left MCA territory ischemia on MRI is occult by CT. Stable chronic white matter and ischemic disease. No new intracranial abnormality. Electronically Signed   By: Odessa Fleming M.D.   On: 07/09/2023 06:28   DG Abd 1  View Result Date: 07/09/2023 CLINICAL DATA:  [id number].  Encounter for orogastric tube placement. EXAM: ABDOMEN - 1 VIEW COMPARISON:  Chest, abdomen and pelvis CT 06/29/2023 FINDINGS: There is a large hiatal hernia with about half of the stomach intrathoracic. NGT is in place and coiled within the herniated portion of the stomach. There is mild dilatation of some of the left abdominal small bowel segments up to 3.3 cm which could be due to ileus or partial small bowel obstruction. Gas and stool are present in the colon at least to the proximal descending segment. There are cholecystectomy clips. No supine evidence of free air. Lung bases are clear. Cardiomegaly. IMPRESSION: 1. Large hiatal hernia with about half of the stomach intrathoracic. 2. NGT is in place and coiled within the herniated portion of the stomach. 3. Mild dilatation of some of the left abdominal small bowel segments up to 3.3 cm which could be due to ileus or partial small bowel obstruction. Electronically Signed   By: Almira Bar M.D.   On: 07/09/2023 06:12   MR BRAIN WO CONTRAST Result Date: 07/09/2023 CLINICAL DATA:  Stroke, follow up EXAM: MRI HEAD WITHOUT CONTRAST TECHNIQUE: Multiplanar, multiecho pulse sequences of the brain and surrounding structures were obtained without intravenous contrast. COMPARISON:  MRI head May 21 24. FINDINGS: Brain: Small acute infarct in the left insula and a few punctate acute infarcts in the left frontal lobe. No significant mass effect. Remote left basal ganglia infarcts. Patchy additional T2/FLAIR hyperintensities in the white matter, compatible with chronic microvascular ischemic change. No midline shift. No hydrocephalus. No mass lesion. Vascular: Major arterial flow voids are maintained at the skull base. Stented left MCA. Skull and upper cervical spine: Normal marrow signal. Sinuses/Orbits: Clear sinuses.  No acute orbital findings. Other: No mastoid effusions. IMPRESSION: Small acute infarct in the left  insula and a few punctate acute infarcts in the left frontal lobe Electronically Signed   By: Feliberto Harts M.D.   On: 07/09/2023 01:53   CT ANGIO HEAD NECK W WO CM W PERF (CODE STROKE) Result Date: 07/08/2023 CLINICAL DATA:  Provided history: Neuro deficit, acute, stroke suspected. Slurred speech. Right-sided weakness. Confusion. Unable to follow commands. EXAM: CT ANGIOGRAPHY HEAD AND NECK CT PERFUSION BRAIN TECHNIQUE: Multidetector CT imaging of the head and neck was performed using the standard protocol during bolus administration of intravenous contrast. Multiplanar CT image reconstructions and MIPs were obtained to evaluate the vascular anatomy. Carotid stenosis measurements (when applicable) are obtained utilizing NASCET criteria, using the distal internal carotid diameter as the denominator. Multiphase CT imaging of the brain was performed following IV bolus contrast injection. Subsequent parametric perfusion maps were calculated using RAPID software. RADIATION DOSE REDUCTION: This exam was performed according to the departmental dose-optimization program which includes automated exposure control, adjustment of the mA and/or kV according to patient size and/or use of iterative reconstruction technique. CONTRAST:  OMNIPAQUE IOHEXOL 350 MG/ML SOLN COMPARISON:  Non-contrast head CT performed earlier today 07/08/2023. CTA head/neck 09/19/2022. Brain MRI 09/23/2022. CT chest/abdomen/pelvis 06/29/2023. FINDINGS: CTA NECK FINDINGS Aortic arch: Standard aortic branching. Atherosclerotic plaque within the visualized thoracic aorta and proximal  major branch vessels of the neck. No hemodynamically significant innominate or proximal subclavian artery stenosis. Right carotid system: CCA and ICA patent within the neck without hemodynamically significant stenosis (50% or greater). Atherosclerotic plaque at the CCA origin and about the carotid bifurcation. Left carotid system: CCA and ICA patent within the neck  without hemodynamically significant stenosis (50% or greater). Atherosclerotic plaque at the CCA origin, scattered elsewhere within the CCA and about the carotid bifurcation. Tortuosity of the cervical ICA. Mild nonspecific fusiform dilation of the mid-to-distal cervical ICA, unchanged from the prior CTA of 09/19/2022. Vertebral arteries: The vertebral arteries are patent within the neck without stenosis or significant atherosclerotic disease. The left vertebral artery is slightly dominant. Skeleton: Spondylosis of the cervical and visualized upper thoracic levels. The patient is edentulous. Other neck: No neck mass or cervical lymphadenopathy. Upper chest: 14 mm part-solid right upper lobe pulmonary nodule as was demonstrated on the recent prior CT chest/abdomen/pelvis of 09/19/2022 (series 7, image 391). Review of the MIP images confirms the above findings CTA HEAD FINDINGS Anterior circulation: The intracranial internal carotid arteries are patent. Atherosclerotic plaque within both vessels with no more than mild stenosis. A vascular stent is present within the M1 and M2 left middle cerebral artery. There is a subocclusive filling defect within the stented portion of the distal M1 left middle cerebral artery consistent with thrombus (for instance as seen on series 9, image 166) (series 7, image 132). This results in a severe stenosis. No left M2 proximal branch occlusion is identified. Severe stenosis within an M3 left MCA vessel (series 13, image 29). The right middle cerebral artery M1 segment is patent. No right M2 proximal branch occlusion or high-grade proximal arterial stenosis. The anterior cerebral arteries are patent. 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 Posterior circulation: The intracranial vertebral arteries are patent. Mild atherosclerotic  irregularity of the V4 right vertebral artery. The basilar artery is patent. The posterior cerebral arteries are patent. A left posterior communicating artery is present. The right posterior communicating artery is diminutive or absent. Venous sinuses: Within the limitations of contrast timing, no convincing thrombus. Anatomic variants: As described. Review of the MIP images confirms the above findings CT Brain Perfusion Findings: CBF (<30%) Volume: 8mL Perfusion (Tmax>6.0s) volume: 56mL Mismatch Volume: 48mL Infarction Location:Left MCA vascular territory. CTA head impressions #1 and #2 These results were called by telephone at the time of interpretation on 07/08/2023 at 6:41 pm to provider MCNEILL [facility] , who verbally acknowledged these results. IMPRESSION: CTA neck: 1. The common carotid and internal carotid arteries are patent within the neck without hemodynamically significant stenosis. Atherosclerotic plaque bilaterally, as described. 2. Vertebral arteries patent within the neck without stenosis or significant atherosclerotic disease. 3. Aortic Atherosclerosis (ICD10-I70.0). 4. 14 mm part-solid right upper lobe pulmonary nodule as was demonstrated on the recent prior CT chest/abdomen/pelvis of 06/29/2023 (series 7, image 391). Consider one of the following: a follow-up non-contrast chest CT 3 months from the previous examination of 06/29/2023, a follow-up PET-CT or tissue sampling. This recommendation follows the consensus statement: Guidelines for Management of Incidental Pulmonary Nodules Detected on CT Images: From the Fleischner Society 2017; Radiology 2017; 284:228-243. CTA head: 1. Subocclusive filling defect (consistent with thrombus) within the stented portion of the left middle cerebral artery M1 segment. Resultant severe stenosis at this site. 2. Severe stenosis within a left middle  cerebral artery M3 segment vessel. 3. Background intracranial sclerotic disease as described. 4. 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 CT perfusion head: The perfusion software identifies an 8 mL core infarct within the left MCA vascular territory. The perfusion software identifies a 56 mL region of critically hypoperfused parenchyma within the left MCA vascular territory (utilizing the Tmax>6 seconds threshold.). Reported mismatch volume: 48 mL. Electronically Signed   By: Jackey Loge D.O.   On: 07/08/2023 19:18   CT HEAD CODE STROKE WO CONTRAST Result Date: 07/08/2023 CLINICAL DATA:  Code stroke. Provided history: Slurred speech. Right-sided weakness. Confusion. Inability to follow commands. EXAM: CT HEAD WITHOUT CONTRAST TECHNIQUE: Contiguous axial images were obtained from the base of the skull through the vertex without intravenous contrast. RADIATION DOSE REDUCTION: This exam was performed according to the departmental dose-optimization program which includes automated exposure control, adjustment of the mA and/or kV according to patient size and/or use of iterative reconstruction technique. COMPARISON:  Brain MRI 09/23/2022. FINDINGS: Brain: Generalized cerebral and cerebellar atrophy. Chronic infarcts within the left basal ganglia. Small chronic cortical infarct within the mid left frontal lobe (series 6, image 22). Background moderate patchy and ill-defined hypoattenuation within the cerebral white matter, nonspecific but compatible with chronic small vessel ischemic disease. There is no acute intracranial hemorrhage. No acute demarcated cortical infarct. No extra-axial fluid collection. No evidence of an intracranial mass. No midline shift. Vascular: Atherosclerotic calcifications. Vascular stent along the course of the left middle cerebral artery M1 and proximal M2 segments. No hyperdense vessel identified elsewhere. Skull: No calvarial fracture or aggressive osseous lesion. Sinuses/Orbits: No mass or acute finding within the  imaged orbits. Minimal mucosal thickening within bilateral ethmoid air cells. ASPECTS [facility] Stroke Program Early CT Score) - Ganglionic level infarction (caudate, lentiform nuclei, internal capsule, insula, M1-M3 cortex): 7 - Supraganglionic infarction (M4-M6 cortex): 3 Total score (0-10 with 10 being normal): 10 (when discounting chronic infarcts). No evidence of an acute intracranial abnormality. These results were communicated to Dr. Amada Jupiter at 6:33 pmon 3/5/2025by text page via the [facility] messaging system. IMPRESSION: 1.  No evidence of an acute intracranial abnormality. 2. Parenchymal atrophy, chronic small vessel ischemic disease and chronic infarcts, as described. 3. Vascular stent along the course of the left middle cerebral artery M1 and M2 segments. Electronically Signed   By: Jackey Loge D.O.   On: 07/08/2023 18:37     PHYSICAL EXAM  Temp:  [97 F (36.1 C)-98.3 F (36.8 C)] 97.7 F (36.5 C) (03/06 1015) Pulse Rate:  [44-72] 72 (03/06 1100) Resp:  [12-27] 22 (03/06 1100) BP: (104-189)/(45-134) 153/68 (03/06 1100) SpO2:  [88 %-100 %] 97 % (03/06 1100) Arterial Line BP: (114-212)/(48-107) 141/58 (03/06 1000) Weight:  [69.8 kg] 69.8 kg (03/05 1812)  General - Well nourished, well developed, in no apparent distress.  Ophthalmologic - fundi not visualized due to noncooperation.  Cardiovascular - Regular rhythm and rate.  Neuro - slightly drowsy but awake with repetitive stimulation, eyes open, orientated to age, place, and time. No aphasia, paucity of speech, following all simple commands. Able to name and repeat in mildly dysarthric voice. No gaze palsy, tracking bilaterally, visual field full. Mild right facial droop. Tongue midline. LUE 5/5, RUE 3+/5 proximal and 4+/5 hand drip. BLE proximal 4/5 and distally L ankle PF/DF 4/5 and left ankle PF/DF 3/5. Sensation symmetrical bilaterally, b/l FTN intact but slow on right and incomplete but no ataxia,  gait not tested.    ASSESSMENT/PLAN Melissa [name] is a 78 y.o. female with history of A-fib on Eliquis, hypertension, hyperlipidemia, diabetes, migraine, OSA not on CPAP, dementia, CHF, GBS, GI bleeding, stroke status post stenting on Brilinta admitted for right-sided weakness, slurred speech, right facial droop and aphasia. No TNK given due to on Eliquis.    Stroke:  left subinsular patchy and punctate left frontal infarcts, embolic likely secondary to A-fib not compliant with Eliquis CT no acute finding CT head and neck left M1 stent distal subocclusive occlusion.  Left M3 severe stenosis.  Bilateral ICA siphon atherosclerosis right more than left Status post IR with TICI3 MRI left insular cortex patchy infarct, punctate left frontal infarct CT repeat no acute finding 2D Echo EF 65 to 70% LDL 68 HgbA1c 5.7 SCDs for VTE prophylaxis Brilinta (ticagrelor) 90 mg bid and Eliquis (apixaban) daily (but per pharmacy, no refill record for this to medication ) prior to admission, now on aspirin 81 mg daily.  Hold off Eliquis for now given severe anemia Patient counseled to be compliant with her antithrombotic medications Ongoing aggressive stroke risk factor management Therapy recommendations: Pending Disposition: Pending  Severe anemia Right groin hemorrhage at incision site Patient denies any leg pain, abdominal pain Examination soft nontender abdomen Hemoglobin 8.5--6.4--2 x PRBC--7.6 Close CBC monitoring Low threshold for CT abdomen to rule out retroperitoneal hemorrhage Hold off Eliquis for now, okay to continue aspirin  History of stroke 09/2022 left MCA infarct with left M1 occlusion, status post IR with TICI3 and left MCA stenting.  EF 60 to 65%.  LDL 120, A1c 5.5.  Discharged on Brilinta and Eliquis and Crestor 40  A-fib Rate controlled Home Eliquis but noncompliant per pharmacy record Now Eliquis on hold due to anemia Will consider Eliquis once hemoglobin stabilized  Diabetes HgbA1c 5.7  goal < 7.0 Controlled CBG monitoring SSI DM education and close PCP follow up  Hypertension Stable BP goal 120 and 160 for 24 hours post IR, then less than 180/105 Long-term BP goal normotensive  Hyperlipidemia Home meds: Crestor 40 LDL 68, goal < 70 Now on Crestor 40 Continue statin at discharge  Dysphagia Did not pass swallow Has NG tube On tube feeding  Other Stroke Risk Factors Advanced age Migraines Obstructive sleep apnea  Other Active Problems History of GBS Dementia History of GI bleeding  Hospital day # 1  This patient is critically ill due to left MCA stent occlusion status post thrombectomy, severe anemia, A-fib and at significant risk of neurological worsening, death form recurrent stroke, hemorrhagic transformation, shock, heart failure. This patient's care requires constant monitoring of vital signs, hemodynamics, respiratory and cardiac monitoring, review of multiple databases, neurological assessment, discussion with family, other specialists and medical decision making of high complexity. I spent 40 minutes of neurocritical care time in the care of this patient.    Marvel Plan, MD PhD Stroke Neurology 07/09/2023 11:58 AM    To contact Stroke Continuity provider, please refer to [email or web address]. After hours, contact General Neurology

## 2023-07-09 NOTE — Progress Notes (Signed)
 Patient found bleeding from IR groin site. MD called, pressure held for 20 minutes, quick clot applied, pressure taped. Pulses +2 palpable, VS stable, patient oriented at current baseline.    Meryl Dare, RN

## 2023-07-09 NOTE — Telephone Encounter (Signed)
 Pharmacy Patient Advocate Encounter  Insurance verification completed.    The patient is insured through [facility].     Ran test claim for Brilinta 60 mg  and the current 30 day co-pay is 0.00.   Ran test claim for Eliquis 5 mg and the current 30 day co-pay is $0.00.     This test claim was processed through [facility]- copay amounts may vary at other pharmacies due to pharmacy/plan contracts, or as the patient moves through the different stages of their insurance plan.

## 2023-07-09 NOTE — Progress Notes (Signed)
*  PRELIMINARY RESULTS* Echocardiogram 2D Echocardiogram has been performed.  Melissa [name] 07/09/2023, 10:23 AM

## 2023-07-09 NOTE — Progress Notes (Signed)
 OT Cancellation Note  Patient Details Name: AKIRE RENNERT MRN: [id number] DOB: 04-27-1946   Cancelled Treatment:    Reason Eval/Treat Not Completed: Patient not medically ready (bleeding from groin site) Pt placed on bedrest until 17:22 today OT will continue to follow for evaluation as appropriate.   Evern Bio Jalayah Gutridge 07/09/2023, 12:56 PM  Nyoka Cowden OTR/L Acute Rehabilitation Services Office: [phone]

## 2023-07-09 NOTE — Progress Notes (Signed)
 Met RN Granville Lewis in MRI she will assume care from here.  Patient VSS.  This RN departed MRI.

## 2023-07-09 NOTE — TOC Initial Note (Signed)
 Transition of Care [facility]) - Initial/Assessment Note    Patient Details  Name: Melissa [name] MRN: [id number] Date of Birth: [birth year]  Transition of Care [facility]) CM/SW Contact:    Lamonte Sakai, Student-Social Work Phone Number: 07/09/2023, 9:03 AM  Clinical Narrative:                  Pt admitted from home, undergoing stroke workup. TOC following for needs.        Patient Goals and CMS Choice            Expected Discharge Plan and Services       Living arrangements for the past 2 months: Single Family Home                                      Prior Living Arrangements/Services Living arrangements for the past 2 months: Single Family Home                     Activities of Daily Living      Permission Sought/Granted                  Emotional Assessment       Orientation: : Oriented to  Time, Oriented to Situation, Oriented to Self      Admission diagnosis:  Acute ischemic left MCA stroke (HCC) [I63.512] Acute ischemic stroke [facility]) [I63.9] Middle cerebral artery stenosis, 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] Cerebrovascular accident (CVA) due to occlusion of left middle cerebral artery (HCC) [I63.512] Patient Active Problem List   Diagnosis Date Noted   Acute ischemic left MCA stroke (HCC) 07/08/2023   Middle cerebral artery stenosis, 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   Stroke (cerebrum) (HCC) 09/20/2022   Middle cerebral artery embolism, 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   Hematemesis 12/07/2017   Acute blood loss anemia 12/07/2017   Dementia (HCC) 12/07/2017   CHF (congestive heart failure) (HCC) 12/07/2017   COPD (chronic obstructive pulmonary disease) (HCC) 12/07/2017   History of completed stroke 12/07/2017   Unspecified atrial fibrillation (HCC) 12/07/2017   CKD (chronic kidney disease), stage III (HCC) 12/07/2017   History of diet-controlled diabetes 12/07/2017   Chronic bilateral low back pain without sciatica 10/02/2016   Gastroesophageal reflux disease without esophagitis  08/26/2016   GAD (generalized anxiety disorder) 07/22/2016   Depression 03/19/2016   Insomnia 03/19/2016   Coronary artery disease involving native coronary artery of native heart without angina pectoris 10/05/2014   Mixed hyperlipidemia 10/05/2014   PCP:  Anselmo Pickler, MD Pharmacy:   [facility] [phone] - [place], [place] - [address] AT [facility] OF [address] & [address] [place] [address] Phone: [phone] Fax: [phone]     Social Drivers of Health (SDOH) Social History: SDOH Screenings   Food Insecurity: No Food Insecurity (06/22/2023)   Received from [facility]  Housing: Low Risk  (09/22/2022)  Transportation Needs: Unmet Transportation Needs (06/22/2023)   Received from [facility]  Utilities: Not At Risk (09/22/2022)  Financial Resource Strain: Medium Risk (06/22/2023)   Received from [facility]  Physical Activity: Inactive (06/22/2023)   Received from [facility]  Social Connections: Unknown (06/22/2023)   Received from [facility]  Stress: Stress Concern Present (06/22/2023)   Received from [facility]  Tobacco Use: Low Risk  (06/23/2023)   Received from [facility]  Health Literacy: Medium Risk (  06/22/2023)   Received from [facility]   SDOH Interventions:     Readmission Risk Interventions     No data to display

## 2023-07-10 DIAGNOSIS — I63512 Cerebral infarction due to unspecified occlusion or stenosis of left middle cerebral artery: Secondary | ICD-10-CM | POA: Diagnosis not present

## 2023-07-10 LAB — CBC
HCT: 24.4 % — ABNORMAL LOW (ref 36.0–46.0)
Hemoglobin: 7.6 g/dL — ABNORMAL LOW (ref 12.0–15.0)
MCH: 23 pg — ABNORMAL LOW (ref 26.0–34.0)
MCHC: 31.1 g/dL (ref 30.0–36.0)
MCV: 73.9 fL — ABNORMAL LOW (ref 80.0–100.0)
Platelets: 328 10*3/uL (ref 150–400)
RBC: 3.3 MIL/uL — ABNORMAL LOW (ref 3.87–5.11)
RDW: 16.8 % — ABNORMAL HIGH (ref 11.5–15.5)
WBC: 6.7 10*3/uL (ref 4.0–10.5)
nRBC: 0 % (ref 0.0–0.2)

## 2023-07-10 LAB — BASIC METABOLIC PANEL
Anion gap: 6 (ref 5–15)
BUN: 9 mg/dL (ref 8–23)
CO2: 23 mmol/L (ref 22–32)
Calcium: 8.3 mg/dL — ABNORMAL LOW (ref 8.9–10.3)
Chloride: 107 mmol/L (ref 98–111)
Creatinine, Ser: 0.89 mg/dL (ref 0.44–1.00)
GFR, Estimated: >60 mL/min (ref >60)
Glucose, Bld: 122 mg/dL — ABNORMAL HIGH (ref 70–99)
Potassium: 3.5 mmol/L (ref 3.5–5.1)
Sodium: 136 mmol/L (ref 135–145)

## 2023-07-10 LAB — MAGNESIUM: Magnesium: 1.7 mg/dL (ref 1.7–2.4)

## 2023-07-10 LAB — GLUCOSE, CAPILLARY
Glucose-Capillary: 130 mg/dL — ABNORMAL HIGH (ref 70–99)
Glucose-Capillary: 131 mg/dL — ABNORMAL HIGH (ref 70–99)
Glucose-Capillary: 135 mg/dL — ABNORMAL HIGH (ref 70–99)
Glucose-Capillary: 143 mg/dL — ABNORMAL HIGH (ref 70–99)
Glucose-Capillary: 240 mg/dL — ABNORMAL HIGH (ref 70–99)

## 2023-07-10 LAB — PHOSPHORUS: Phosphorus: 3.5 mg/dL (ref 2.5–4.6)

## 2023-07-10 MED ORDER — MAGNESIUM SULFATE 4 GM/100ML IV SOLN
4.0000 g | Freq: Once | INTRAVENOUS | Status: AC
Start: 2023-07-10 — End: 2023-07-10
  Administered 2023-07-10: 4 g via INTRAVENOUS
  Filled 2023-07-10: qty 100

## 2023-07-10 MED ORDER — ENOXAPARIN SODIUM 40 MG/0.4ML IJ SOSY
40.0000 mg | PREFILLED_SYRINGE | INTRAMUSCULAR | Status: DC
  Administered 2023-07-10 – 2023-07-12 (×3): 40 mg via SUBCUTANEOUS
  Filled 2023-07-10 (×3): qty 0.4

## 2023-07-10 MED ORDER — POTASSIUM CHLORIDE 20 MEQ PO PACK
40.0000 meq | PACK | ORAL | Status: AC
  Administered 2023-07-10: 40 meq via ORAL
  Filled 2023-07-10: qty 2

## 2023-07-10 MED ORDER — LOSARTAN POTASSIUM 50 MG PO TABS
50.0000 mg | ORAL_TABLET | Freq: Every day | ORAL | Status: DC
  Administered 2023-07-10 – 2023-07-15 (×6): 50 mg via ORAL
  Filled 2023-07-10 (×6): qty 1

## 2023-07-10 MED ORDER — ROSUVASTATIN CALCIUM 20 MG PO TABS
40.0000 mg | ORAL_TABLET | Freq: Every day | ORAL | Status: DC
  Administered 2023-07-11 – 2023-07-15 (×5): 40 mg via ORAL
  Filled 2023-07-10 (×5): qty 2

## 2023-07-10 MED ORDER — SERTRALINE HCL 50 MG PO TABS
25.0000 mg | ORAL_TABLET | Freq: Every day | ORAL | Status: DC
Start: 2023-07-10 — End: 2023-07-15
  Administered 2023-07-10 – 2023-07-15 (×6): 25 mg via ORAL
  Filled 2023-07-10 (×6): qty 1

## 2023-07-10 MED ORDER — POTASSIUM CHLORIDE 20 MEQ PO PACK
40.0000 meq | PACK | ORAL | Status: DC
Start: 2023-07-10 — End: 2023-07-10
  Administered 2023-07-10: 40 meq
  Filled 2023-07-10: qty 2

## 2023-07-10 NOTE — Evaluation (Signed)
 Physical Therapy Evaluation Patient Details Name: Melissa [name] MRN: [id number] DOB: November 27, 1945 Today's Date: 07/10/2023  History of Present Illness  The pt is a 78 yo female presenting 3/5 with  right-sided facial droop and weakness. Work up revealed: infarct of L MCA, s/p thrombectomy 3/5. Pt with bleeding from groin site 3/6. 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   Clinical Impression  Pt in bed upon arrival of PT, agreeable to evaluation at this time. Prior to admission the pt was mobilizing with use of a cane or RW in her home, and either cane or electric scooter when in community. She reports living with her adult son who can assist as needed at home. The pt was able to complete bed mobility and sit-stand transfers without need for assistance, then completed short bout of ambulation in her room with single UE support on IV pole to mimic use of cane at home. The pt will continue to benefit from skilled PT acutely to progress ambulation distance and stability, but is near her functional baseline and will be able to return home with assist from family as needed once medically cleared.      If plan is discharge home, recommend the following: A little help with walking and/or transfers;Assistance with cooking/housework;Direct supervision/assist for medications management;Direct supervision/assist for financial management;Assist for transportation;Help with stairs or ramp for entrance   Can travel by private vehicle        Equipment Recommendations None recommended by PT  Recommendations for Other Services       Functional Status Assessment Patient has had a recent decline in their functional status and demonstrates the ability to make significant improvements in function in a reasonable and predictable amount of time.      Precautions / Restrictions Precautions Precautions: Fall Recall of Precautions/Restrictions: Intact Restrictions Weight Bearing Restrictions Per Provider Order: No      Mobility  Bed Mobility Overal bed mobility: Independent                  Transfers Overall transfer level: Needs assistance Equipment used: 1 person hand held assist Transfers: Sit to/from Stand Sit to Stand: Supervision           General transfer comment: no assist but supervision for lines and balance    Ambulation/Gait Ambulation/Gait assistance: Contact guard assist Gait Distance (Feet): 45 Feet Assistive device: IV Pole Gait Pattern/deviations: Step-through pattern, Decreased stride length Gait velocity: decreased Gait velocity interpretation: <1.31 ft/sec, indicative of household ambulator   General Gait Details: pt with small strides, but may be more due to limited space walking in room. prefers single UE support to mimic cane at home. no buckling or LOB    Modified Rankin (Stroke Patients Only) Modified Rankin (Stroke Patients Only) Pre-Morbid Rankin Score: No symptoms Modified Rankin: Moderately severe disability     Balance Overall balance assessment: Mild deficits observed, not formally tested                                           Pertinent Vitals/Pain Pain Assessment Pain Assessment: No/denies pain    Home Living Family/patient expects to be discharged to:: Private residence Living Arrangements: Children (Son) Available Help at Discharge: Family;Available 24 hours/day Type of Home: House Home Access: Stairs  to enter Entrance Stairs-Rails: Right;Left Entrance Stairs-Number of Steps: 5   Home Layout: One level Home Equipment: Agricultural consultant (2 wheels);Cane - single point;Wheelchair - Lawyer Comments: Lives with son who has epiliepsy but can provide 24 hr physical support.  Neither drive. bed bug noted on 07/09/23 admission. no  animals    Prior Function Prior Level of Function : Needs assist             Mobility Comments: Ambulates with cane - could ambulate in community, reports using RW in the house ADLs Comments: son does the cleaning in the house, they shop together for groceries     Extremity/Trunk Assessment   Upper Extremity Assessment Upper Extremity Assessment: Defer to OT evaluation LUE Deficits / Details: 4th digit trigger finger pt reports "thats been like that"    Lower Extremity Assessment Lower Extremity Assessment: Overall WFL for tasks assessed    Cervical / Trunk Assessment Cervical / Trunk Assessment: Normal  Communication   Communication Communication: Impaired Factors Affecting Communication: Reduced clarity of speech;Hearing impaired    Cognition Arousal: Alert Behavior During Therapy: Flat affect   PT - Cognitive impairments: No family/caregiver present to determine baseline                       PT - Cognition Comments: pt inconsistent with answers or making general statements such as "he does what I ask him to" when asked what kinds of help she needs at home. Pt generally following commands well Following commands: Intact       Cueing Cueing Techniques: Gestural cues, Verbal cues     General Comments General comments (skin integrity, edema, etc.): VSS on RA        Assessment/Plan    PT Assessment Patient needs continued PT services  PT Problem List Decreased activity tolerance;Decreased balance;Decreased mobility;Decreased coordination;Decreased safety awareness       PT Treatment Interventions DME instruction;Stair training;Gait training;Functional mobility training;Therapeutic activities;Therapeutic exercise;Balance training;Patient/family education    PT Goals (Current goals can be found in the Care Plan section)  Acute Rehab PT Goals Patient Stated Goal: return home PT Goal Formulation: With patient Time For Goal Achievement:  07/24/23 Potential to Achieve Goals: Good    Frequency Min 2X/week     Co-evaluation   Reason for Co-Treatment: For patient/therapist safety;To address functional/ADL transfers;Necessary to address cognition/behavior during functional activity   OT goals addressed during session: ADL's and self-care;Proper use of Adaptive equipment and DME;Strengthening/ROM       AM-PAC PT "6 Clicks" Mobility  Outcome Measure Help needed turning from your back to your side while in a flat bed without using bedrails?: None Help needed moving from lying on your back to sitting on the side of a flat bed without using bedrails?: None Help needed moving to and from a bed to a chair (including a wheelchair)?: A Little Help needed standing up from a chair using your arms (e.g., wheelchair or bedside chair)?: A Little Help needed to walk in hospital room?: A Little Help needed climbing 3-5 steps with a railing? : A Little 6 Click Score: 20    End of Session Equipment Utilized During Treatment: Gait belt Activity Tolerance: Patient tolerated treatment well Patient left: in chair;with call bell/phone within reach;with chair alarm set Nurse Communication: Mobility status PT Visit Diagnosis: Unsteadiness on feet (R26.81);Muscle weakness (generalized) (M62.81)    Time: 0981-1914 PT Time Calculation (min) (ACUTE ONLY): 26 min   Charges:   PT  Evaluation $PT Eval Moderate Complexity: 1 Mod   PT General Charges $$ ACUTE PT VISIT: 1 Visit         Vickki Muff, PT, DPT   Acute Rehabilitation Department Office [phone] Secure Chat Communication Preferred  Ronnie Derby 07/10/2023, 2:20 PM

## 2023-07-10 NOTE — Progress Notes (Signed)
 Patient received from transferring unit 4N. Assisted to bed, VS obtained, declined pain, telemetry applied, contact precautions continued, oriented to room and controls. Dinner ordered to room, water at bedside. Call bell and bed alarm in place.

## 2023-07-10 NOTE — Anesthesia Postprocedure Evaluation (Signed)
 Anesthesia Post Note  Patient: Melissa [name]  Procedure(s) Performed: RADIOLOGY WITH ANESTHESIA     Patient location during evaluation: PACU Anesthesia Type: General Level of consciousness: awake and alert Pain management: pain level controlled Vital Signs Assessment: post-procedure vital signs reviewed and stable Respiratory status: spontaneous breathing, nonlabored ventilation, respiratory function stable and patient connected to nasal cannula oxygen Cardiovascular status: blood pressure returned to baseline and stable Postop Assessment: no apparent nausea or vomiting Anesthetic complications: no   No notable events documented.  Last Vitals:  Vitals:   07/10/23 0800 07/10/23 0900  BP: (!) 151/62 (!) 147/97  Pulse: (!) 53 68  Resp: 17 (!) 21  Temp: 36.7 C   SpO2: 94% 96%    Last Pain:  Vitals:   07/10/23 0800  TempSrc: Oral  PainSc:                  Damani Rando S

## 2023-07-10 NOTE — Progress Notes (Signed)
 STROKE TEAM PROGRESS NOTE   SUBJECTIVE (INTERVAL HISTORY) No family is at the bedside.  Pt lying in bed, AAO x 3 today, much awake alert than yesterday. Groin bleeding stopped and Hb stable. Right hemiparesis much improved.     OBJECTIVE Temp:  [98 F (36.7 C)-99.7 F (37.6 C)] 98.4 F (36.9 C) (03/07 1200) Pulse Rate:  [52-80] 78 (03/07 1100) Cardiac Rhythm: Atrial fibrillation (03/07 0700) Resp:  [14-23] 20 (03/07 1100) BP: (130-165)/(55-97) 165/91 (03/07 1100) SpO2:  [90 %-100 %] 95 % (03/07 1100) Weight:  [69.5 kg] 69.5 kg (03/07 0500)  Recent Labs  Lab 07/08/23 1810 07/10/23 0013 07/10/23 0319 07/10/23 0753 07/10/23 1153  GLUCAP 240* 130* 131* 135* 143*   Recent Labs  Lab 07/08/23 1814 07/09/23 0124 07/09/23 1213 07/10/23 0528  NA 138 137  --  136  K 3.7 3.8  --  3.5  CL 107 105  --  107  CO2  --  22  --  23  GLUCOSE 100* 110*  --  122*  BUN 4* <5*  --  9  CREATININE 1.00 0.83  --  0.89  CALCIUM  --  8.6*  --  8.3*  MG  --   --  1.6* 1.7  PHOS  --   --  3.1 3.5   Recent Labs  Lab 07/09/23 0124  AST 19  ALT 12  ALKPHOS 61  BILITOT 1.0  PROT 5.8*  ALBUMIN 3.1*   Recent Labs  Lab 07/08/23 1814 07/09/23 0124 07/09/23 0227 07/09/23 1552 07/10/23 0528  WBC  --  7.0  --   --  6.7  NEUTROABS  --  5.8  --   --   --   HGB 8.5* 6.4* 6.4* 7.6* 7.6*  HCT 25.0* 21.7* 21.6* 24.5* 24.4*  MCV  --  72.6*  --   --  73.9*  PLT  --  368  --   --  328   No results for input(s): "CKTOTAL", "CKMB", "CKMBINDEX", "TROPONINI" in the last 168 hours. Recent Labs    07/09/23 0124  LABPROT 13.7  INR 1.0   No results for input(s): "COLORURINE", "LABSPEC", "PHURINE", "GLUCOSEU", "HGBUR", "BILIRUBINUR", "KETONESUR", "PROTEINUR", "UROBILINOGEN", "NITRITE", "LEUKOCYTESUR" in the last 72 hours.  Invalid input(s): "APPERANCEUR"     Component Value Date/Time   CHOL 128 07/09/2023 0124   TRIG 59 07/09/2023 0124   HDL 48 07/09/2023 0124   CHOLHDL 2.7 07/09/2023 0124    VLDL 12 07/09/2023 0124   LDLCALC 68 07/09/2023 0124   Lab Results  Component Value Date   HGBA1C 5.7 (H) 07/09/2023   No results found for: "LABOPIA", "COCAINSCRNUR", "LABBENZ", "AMPHETMU", "THCU", "LABBARB"  Recent Labs  Lab 07/09/23 0124  ETH <10    I have personally reviewed the radiological images below and agree with the radiology interpretations.  IR PERCUTANEOUS ART THROMBECTOMY/INFUSION INTRACRANIAL INC DIAG ANGIO Result Date: 07/10/2023 INDICATION: New onset aphasia and right-sided weakness. 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 EXAM: 1. EMERGENT LARGE VESSEL OCCLUSION THROMBOLYSIS (anterior CIRCULATION) COMPARISON:  CT angiogram of the head and neck July 08, 2023. MEDICATIONS: No antibiotic was administered within 1 hour of the procedure. ANESTHESIA/SEDATION: General anesthesia. CONTRAST:  Omnipaque 300 approximately 75 cc. FLUOROSCOPY TIME:  Fluoroscopy Time: 9 minutes 54 seconds (812 mGy). COMPLICATIONS: None immediate. TECHNIQUE: Following a full explanation of the procedure along with the potential associated complications, an informed witnessed consent was obtained. The risks of intracranial  hemorrhage of 10%, worsening neurological deficit, ventilator dependency, death and inability to revascularize were all reviewed in detail with the patient's son. The patient was then put under general anesthesia by the Department of Anesthesiology at [facility]. The right groin was prepped and draped in the usual sterile fashion. Thereafter using modified Seldinger technique, transfemoral access into the right common femoral artery was obtained without difficulty. Over an 0.035 inch guidewire an 8 French 25 cm Pinnacle sheath was inserted. Through this, and also over an 0.035 inch guidewire a combination of an 088 100 cm Zoom support catheter with a 125 cm 6 French Simmons 2 support catheter was advanced to the aortic  arch region, and selectively positioned in the left common carotid artery, and the distal left internal carotid artery. FINDINGS: The left common carotid arteriogram demonstrates the left external carotid artery and its major branches to be widely patent. The left internal carotid to the skull base is widely patent with a moderate to severe tortuosity of the mid cervical ICA. The petrous, the cavernous and the supraclinoid left ICA are widely patent with mild stenosis in the caval segment. A left posterior communicating artery is seen opacifying the left posterior cerebral artery distribution. The left anterior cerebral artery opacifies into the capillary and venous phases. The left middle cerebral artery demonstrates a stable moderate stenosis in the mid M1 segment with near complete occlusion distally at the level of the trifurcation branches. PROCEDURE: Through the 088 support catheter in the distal cervical ICA, a combination of an 062 Penumbra aspiration catheter with an 043 distal aspiration catheter was advanced over an 035 inch colossal guidewire to the supraclinoid left ICA. The wire was then advanced through the occluded stented segment of the middle cerebral artery into the distal stented segment in the superior division followed by advancement of the 043 and the 062 aspiration catheters into the occluded stent. The wire was removed. Aspiration was then applied for about 30 seconds at the hub of the 043 aspiration catheter, with a Penumbra aspiration pump at the hub of the 062 aspiration catheter for approximately 2 minutes. Following retrieval of the two aspiration catheters, a control arteriogram performed through the 088 support catheter demonstrated complete revascularization of the left middle cerebral artery distribution achieving a TICI 3 revascularization. Also noted was patency of the stented segment and also of the proximal left middle cerebral artery M1 segment significantly improved compared  to the pre treatment caliber. Control arteriogram was then performed at 10, 20 and 35 minutes post revascularization. These continued to demonstrate improved caliber of the left middle cerebral artery distribution. The patient was given a total of 4.5 mg of intra arterial Integrilin in order to prevent platelet aggregation within the stent. A final control arteriogram performed through the 088 support catheter in the left common carotid artery demonstrates the left internal carotid artery to be widely patent to the cranial skull base with no change in the tortuosity. Intracranially, the left middle cerebral artery and the left anterior cerebral artery distributions continued to demonstrate wide patency and TICI 3 revascularization. The 088 support catheter was removed. An 8 French Angio-Seal closure device was used for hemostasis at the right groin puncture site. Distal pulses remained present bilaterally unchanged from prior to the procedure. A flat panel CT of the brain demonstrated no evidence of hemorrhagic complications. The patient was extubated. Upon recovery, the patient followed simple commands intermittently appropriately. Movement was noted in the right upper and right lower extremity. Patient  was then transferred to the neuro ICU for post revascularization care. IMPRESSION: Status post complete revascularization of near complete occlusion of the left middle cerebral artery stented segment with 1 pass with a combination of 062 and 043 aspiration catheters achieving a TICI 3 revascularization. PLAN: As per referring MD. Electronically Signed   By: Julieanne Cotton M.D.   On: 07/10/2023 08:50   IR CT Head Ltd Result Date: 07/10/2023 INDICATION: New onset aphasia and right-sided weakness. 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 EXAM: 1. EMERGENT LARGE VESSEL OCCLUSION THROMBOLYSIS (anterior CIRCULATION) COMPARISON:  CT angiogram of the  head and neck July 08, 2023. MEDICATIONS: No antibiotic was administered within 1 hour of the procedure. ANESTHESIA/SEDATION: General anesthesia. CONTRAST:  Omnipaque 300 approximately 75 cc. FLUOROSCOPY TIME:  Fluoroscopy Time: 9 minutes 54 seconds (812 mGy). COMPLICATIONS: None immediate. TECHNIQUE: Following a full explanation of the procedure along with the potential associated complications, an informed witnessed consent was obtained. The risks of intracranial hemorrhage of 10%, worsening neurological deficit, ventilator dependency, death and inability to revascularize were all reviewed in detail with the patient's son. The patient was then put under general anesthesia by the Department of Anesthesiology at [facility]. The right groin was prepped and draped in the usual sterile fashion. Thereafter using modified Seldinger technique, transfemoral access into the right common femoral artery was obtained without difficulty. Over an 0.035 inch guidewire an 8 French 25 cm Pinnacle sheath was inserted. Through this, and also over an 0.035 inch guidewire a combination of an 088 100 cm Zoom support catheter with a 125 cm 6 French Simmons 2 support catheter was advanced to the aortic arch region, and selectively positioned in the left common carotid artery, and the distal left internal carotid artery. FINDINGS: The left common carotid arteriogram demonstrates the left external carotid artery and its major branches to be widely patent. The left internal carotid to the skull base is widely patent with a moderate to severe tortuosity of the mid cervical ICA. The petrous, the cavernous and the supraclinoid left ICA are widely patent with mild stenosis in the caval segment. A left posterior communicating artery is seen opacifying the left posterior cerebral artery distribution. The left anterior cerebral artery opacifies into the capillary and venous phases. The left middle cerebral artery demonstrates a stable  moderate stenosis in the mid M1 segment with near complete occlusion distally at the level of the trifurcation branches. PROCEDURE: Through the 088 support catheter in the distal cervical ICA, a combination of an 062 Penumbra aspiration catheter with an 043 distal aspiration catheter was advanced over an 035 inch colossal guidewire to the supraclinoid left ICA. The wire was then advanced through the occluded stented segment of the middle cerebral artery into the distal stented segment in the superior division followed by advancement of the 043 and the 062 aspiration catheters into the occluded stent. The wire was removed. Aspiration was then applied for about 30 seconds at the hub of the 043 aspiration catheter, with a Penumbra aspiration pump at the hub of the 062 aspiration catheter for approximately 2 minutes. Following retrieval of the two aspiration catheters, a control arteriogram performed through the 088 support catheter demonstrated complete revascularization of the left middle cerebral artery distribution achieving a TICI 3 revascularization. Also noted was patency of the stented segment and also of the proximal left middle cerebral artery M1 segment significantly improved compared to the pre treatment caliber. Control arteriogram  was then performed at 10, 20 and 35 minutes post revascularization. These continued to demonstrate improved caliber of the left middle cerebral artery distribution. The patient was given a total of 4.5 mg of intra arterial Integrilin in order to prevent platelet aggregation within the stent. A final control arteriogram performed through the 088 support catheter in the left common carotid artery demonstrates the left internal carotid artery to be widely patent to the cranial skull base with no change in the tortuosity. Intracranially, the left middle cerebral artery and the left anterior cerebral artery distributions continued to demonstrate wide patency and TICI 3  revascularization. The 088 support catheter was removed. An 8 French Angio-Seal closure device was used for hemostasis at the right groin puncture site. Distal pulses remained present bilaterally unchanged from prior to the procedure. A flat panel CT of the brain demonstrated no evidence of hemorrhagic complications. The patient was extubated. Upon recovery, the patient followed simple commands intermittently appropriately. Movement was noted in the right upper and right lower extremity. Patient was then transferred to the neuro ICU for post revascularization care. IMPRESSION: Status post complete revascularization of near complete occlusion of the left middle cerebral artery stented segment with 1 pass with a combination of 062 and 043 aspiration catheters achieving a TICI 3 revascularization. PLAN: As per referring MD. Electronically Signed   By: Julieanne Cotton M.D.   On: 07/10/2023 08:50   ECHOCARDIOGRAM COMPLETE Result Date: 07/09/2023    ECHOCARDIOGRAM REPORT   Patient Name:   HAILE TOPPINS Date of Exam: 07/09/2023 Medical Rec #:  [id number]        Height:       62.0 in Accession #:    [id number]       Weight:       153.9 lb Date of Birth:  Aug 17, 1945        BSA:          1.710 m Patient Age:    77 years         BP:           112/88 mmHg Patient Gender: F                HR:           57 bpm. Exam Location:  Inpatient Procedure: 2D Echo, Cardiac Doppler and Color Doppler (Both Spectral and Color            Flow Doppler were utilized during procedure). Indications:    Stroke I63.9  History:        Patient has prior history of Echocardiogram examinations, most                 recent 06/29/2023. CHF, CAD, Stroke, CKD 3 and COPD; Risk                 Factors:Dyslipidemia and Non-Smoker.  Sonographer:    Dondra Prader RVT RCS Referring Phys: [id number] Lynnae January IMPRESSIONS  1. Left ventricular ejection fraction, by estimation, is 65 to 70%. The left ventricle has normal function. The left ventricle has no  regional wall motion abnormalities. Left ventricular diastolic parameters were normal.  2. Right ventricular systolic function is normal. The right ventricular size is normal.  3. Left atrial size was severely dilated.  4. The mitral valve is normal in structure. Mild mitral valve regurgitation.  5. The aortic valve is tricuspid. Aortic valve regurgitation is not visualized. Aortic valve sclerosis/calcification is present, without any evidence of aortic  stenosis. Comparison(s): The left ventricular function is unchanged. FINDINGS  Left Ventricle: Left ventricular ejection fraction, by estimation, is 65 to 70%. The left ventricle has normal function. The left ventricle has no regional wall motion abnormalities. The left ventricular internal cavity size was normal in size. There is  no left ventricular hypertrophy. Left ventricular diastolic parameters were normal. Right Ventricle: The right ventricular size is normal. Right vetricular wall thickness was not assessed. Right ventricular systolic function is normal. Left Atrium: Left atrial size was severely dilated. Right Atrium: Right atrial size was normal in size. Pericardium: Trivial pericardial effusion is present. Mitral Valve: The mitral valve is normal in structure. Mild mitral valve regurgitation. Tricuspid Valve: The tricuspid valve is normal in structure. Tricuspid valve regurgitation is mild. Aortic Valve: The aortic valve is tricuspid. Aortic valve regurgitation is not visualized. Aortic valve sclerosis/calcification is present, without any evidence of aortic stenosis. Aortic valve mean gradient measures 6.0 mmHg. Aortic valve peak gradient measures 11.1 mmHg. Aortic valve area, by VTI measures 2.30 cm. Pulmonic Valve: The pulmonic valve was normal in structure. Pulmonic valve regurgitation is not visualized. Aorta: The aortic root and ascending aorta are structurally normal, with no evidence of dilitation. IAS/Shunts: No atrial level shunt detected by  color flow Doppler.  LEFT VENTRICLE PLAX 2D LVIDd:         4.40 cm   Diastology LVIDs:         2.80 cm   LV e' medial:    8.11 cm/s LV PW:         1.20 cm   LV E/e' medial:  20.4 LV IVS:        0.90 cm   LV e' lateral:   7.26 cm/s LVOT diam:     2.00 cm   LV E/e' lateral: 22.8 LV SV:         85 LV SV Index:   50 LVOT Area:     3.14 cm  RIGHT VENTRICLE             IVC RV Basal diam:  3.75 cm     IVC diam: 2.90 cm RV Mid diam:    2.70 cm RV S prime:     11.35 cm/s TAPSE (M-mode): 1.7 cm LEFT ATRIUM             Index        RIGHT ATRIUM           Index LA diam:        3.90 cm 2.28 cm/m   RA Area:     20.30 cm LA Vol (A2C):   89.9 ml 52.57 ml/m  RA Volume:   61.60 ml  36.02 ml/m LA Vol (A4C):   92.0 ml 53.80 ml/m LA Biplane Vol: 94.9 ml 55.49 ml/m  AORTIC VALVE                     PULMONIC VALVE AV Area (Vmax):    2.28 cm      PV Vmax:       0.96 m/s AV Area (Vmean):   2.34 cm      PV Peak grad:  3.7 mmHg AV Area (VTI):     2.30 cm AV Vmax:           166.33 cm/s AV Vmean:          111.667 cm/s AV VTI:            0.372 m AV Peak Grad:  11.1 mmHg AV Mean Grad:      6.0 mmHg LVOT Vmax:         120.75 cm/s LVOT Vmean:        83.350 cm/s LVOT VTI:          0.272 m LVOT/AV VTI ratio: 0.73  AORTA Ao Root diam: 2.70 cm Ao Asc diam:  3.20 cm MITRAL VALVE                TRICUSPID VALVE MV Area (PHT): 3.42 cm     TR Peak grad:   44.9 mmHg MV Decel Time: 222 msec     TR Vmax:        335.00 cm/s MV E velocity: 165.67 cm/s MV A velocity: 58.90 cm/s   SHUNTS MV E/A ratio:  2.81         Systemic VTI:  0.27 m                             Systemic Diam: 2.00 cm Dietrich Pates MD Electronically signed by Dietrich Pates MD Signature Date/Time: 07/09/2023/2:00:23 PM    Final    CT HEAD WO CONTRAST ( ) Result Date: 07/09/2023 CLINICAL DATA:  78 year old female recurrent code stroke presentation on 07/08/2023, left MCA M1 stent thrombus. Status post endovascular revascularization. Punctate left hemisphere infarcts on post treatment  MRI. EXAM: CT HEAD WITHOUT CONTRAST TECHNIQUE: Contiguous axial images were obtained from the base of the skull through the vertex without intravenous contrast. RADIATION DOSE REDUCTION: This exam was performed according to the departmental dose-optimization program which includes automated exposure control, adjustment of the mA and/or kV according to patient size and/or use of iterative reconstruction technique. COMPARISON:  Brain MRI yesterday and earlier. FINDINGS: Brain: No acute intracranial hemorrhage identified. No midline shift, mass effect, or evidence of intracranial mass lesion. No ventriculomegaly. Gray-white differentiation appears stable by CT since presentation yesterday with chronic ischemic changes in the left MCA territory, confluent chronic cerebral white matter disease. Subtle diffusion abnormality by MRI is not correlated. And no new cortically based infarct is identified. Vascular: Extensive Calcified atherosclerosis at the skull base. Left MCA stent redemonstrated. Skull: Stable, intact. Sinuses/Orbits: Left nasoenteric tube now in place. Visualized paranasal sinuses and mastoids are stable and well aerated. Other: No acute orbit or scalp soft tissue finding. IMPRESSION: No acute intracranial hemorrhage and subtle Left MCA territory ischemia on MRI is occult by CT. Stable chronic white matter and ischemic disease. No new intracranial abnormality. Electronically Signed   By: Odessa Fleming M.D.   On: 07/09/2023 06:28   DG Abd 1 View Result Date: 07/09/2023 CLINICAL DATA:  [id number].  Encounter for orogastric tube placement. EXAM: ABDOMEN - 1 VIEW COMPARISON:  Chest, abdomen and pelvis CT 06/29/2023 FINDINGS: There is a large hiatal hernia with about half of the stomach intrathoracic. NGT is in place and coiled within the herniated portion of the stomach. There is mild dilatation of some of the left abdominal small bowel segments up to 3.3 cm which could be due to ileus or partial small bowel  obstruction. Gas and stool are present in the colon at least to the proximal descending segment. There are cholecystectomy clips. No supine evidence of free air. Lung bases are clear. Cardiomegaly. IMPRESSION: 1. Large hiatal hernia with about half of the stomach intrathoracic. 2. NGT is in place and coiled within the herniated portion of the stomach. 3. Mild dilatation of some of the left abdominal  small bowel segments up to 3.3 cm which could be due to ileus or partial small bowel obstruction. Electronically Signed   By: Almira Bar M.D.   On: 07/09/2023 06:12   MR BRAIN WO CONTRAST Result Date: 07/09/2023 CLINICAL DATA:  Stroke, follow up EXAM: MRI HEAD WITHOUT CONTRAST TECHNIQUE: Multiplanar, multiecho pulse sequences of the brain and surrounding structures were obtained without intravenous contrast. COMPARISON:  MRI head May 21 24. FINDINGS: Brain: Small acute infarct in the left insula and a few punctate acute infarcts in the left frontal lobe. No significant mass effect. Remote left basal ganglia infarcts. Patchy additional T2/FLAIR hyperintensities in the white matter, compatible with chronic microvascular ischemic change. No midline shift. No hydrocephalus. No mass lesion. Vascular: Major arterial flow voids are maintained at the skull base. Stented left MCA. Skull and upper cervical spine: Normal marrow signal. Sinuses/Orbits: Clear sinuses.  No acute orbital findings. Other: No mastoid effusions. IMPRESSION: Small acute infarct in the left insula and a few punctate acute infarcts in the left frontal lobe Electronically Signed   By: Feliberto Harts M.D.   On: 07/09/2023 01:53   CT ANGIO HEAD NECK W WO CM W PERF (CODE STROKE) Result Date: 07/08/2023 CLINICAL DATA:  Provided history: Neuro deficit, acute, stroke suspected. Slurred speech. Right-sided weakness. Confusion. Unable to follow commands. EXAM: CT ANGIOGRAPHY HEAD AND NECK CT PERFUSION BRAIN TECHNIQUE: Multidetector CT imaging of the head  and neck was performed using the standard protocol during bolus administration of intravenous contrast. Multiplanar CT image reconstructions and MIPs were obtained to evaluate the vascular anatomy. Carotid stenosis measurements (when applicable) are obtained utilizing NASCET criteria, using the distal internal carotid diameter as the denominator. Multiphase CT imaging of the brain was performed following IV bolus contrast injection. Subsequent parametric perfusion maps were calculated using RAPID software. RADIATION DOSE REDUCTION: This exam was performed according to the departmental dose-optimization program which includes automated exposure control, adjustment of the mA and/or kV according to patient size and/or use of iterative reconstruction technique. CONTRAST:  OMNIPAQUE IOHEXOL 350 MG/ML SOLN COMPARISON:  Non-contrast head CT performed earlier today 07/08/2023. CTA head/neck 09/19/2022. Brain MRI 09/23/2022. CT chest/abdomen/pelvis 06/29/2023. FINDINGS: CTA NECK FINDINGS Aortic arch: Standard aortic branching. Atherosclerotic plaque within the visualized thoracic aorta and proximal major branch vessels of the neck. No hemodynamically significant innominate or proximal subclavian artery stenosis. Right carotid system: CCA and ICA patent within the neck without hemodynamically significant stenosis (50% or greater). Atherosclerotic plaque at the CCA origin and about the carotid bifurcation. Left carotid system: CCA and ICA patent within the neck without hemodynamically significant stenosis (50% or greater). Atherosclerotic plaque at the CCA origin, scattered elsewhere within the CCA and about the carotid bifurcation. Tortuosity of the cervical ICA. Mild nonspecific fusiform dilation of the mid-to-distal cervical ICA, unchanged from the prior CTA of 09/19/2022. Vertebral arteries: The vertebral arteries are patent within the neck without stenosis or significant atherosclerotic disease. The left vertebral  artery is slightly dominant. Skeleton: Spondylosis of the cervical and visualized upper thoracic levels. The patient is edentulous. Other neck: No neck mass or cervical lymphadenopathy. Upper chest: 14 mm part-solid right upper lobe pulmonary nodule as was demonstrated on the recent prior CT chest/abdomen/pelvis of 09/19/2022 (series 7, image 391). Review of the MIP images confirms the above findings CTA HEAD FINDINGS Anterior circulation: The intracranial internal carotid arteries are patent. Atherosclerotic plaque within both vessels with no more than mild stenosis. A vascular stent is present within the M1 and  M2 left middle cerebral artery. There is a subocclusive filling defect within the stented portion of the distal M1 left middle cerebral artery consistent with thrombus (for instance as seen on series 9, image 166) (series 7, image 132). This results in a severe stenosis. No left M2 proximal branch occlusion is identified. Severe stenosis within an M3 left MCA vessel (series 13, image 29). The right middle cerebral artery M1 segment is patent. No right M2 proximal branch occlusion or high-grade proximal arterial stenosis. The anterior cerebral arteries are patent. 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 Posterior circulation: The intracranial vertebral arteries are patent. Mild atherosclerotic irregularity of the V4 right vertebral artery. The basilar artery is patent. The posterior cerebral arteries are patent. A left posterior communicating artery is present. The right posterior communicating artery is diminutive or absent. Venous sinuses: Within the limitations of contrast timing, no convincing thrombus. Anatomic variants: As described. Review of the MIP images confirms the above findings CT Brain Perfusion Findings: CBF (<30%) Volume: 8mL Perfusion  (Tmax>6.0s) volume: 56mL Mismatch Volume: 48mL Infarction Location:Left MCA vascular territory. CTA head impressions #1 and #2 These results were called by telephone at the time of interpretation on 07/08/2023 at 6:41 pm to provider [facility] , who verbally acknowledged these results. IMPRESSION: CTA neck: 1. The common carotid and internal carotid arteries are patent within the neck without hemodynamically significant stenosis. Atherosclerotic plaque bilaterally, as described. 2. Vertebral arteries patent within the neck without stenosis or significant atherosclerotic disease. 3. Aortic Atherosclerosis (ICD10-I70.0). 4. 14 mm part-solid right upper lobe pulmonary nodule as was demonstrated on the recent prior CT chest/abdomen/pelvis of 06/29/2023 (series 7, image 391). Consider one of the following: a follow-up non-contrast chest CT 3 months from the previous examination of 06/29/2023, a follow-up PET-CT or tissue sampling. This recommendation follows the consensus statement: Guidelines for Management of Incidental Pulmonary Nodules Detected on CT Images: From the Fleischner Society 2017; Radiology 2017; 284:228-243. CTA head: 1. Subocclusive filling defect (consistent with thrombus) within the stented portion of the left middle cerebral artery M1 segment. Resultant severe stenosis at this site. 2. Severe stenosis within a left middle cerebral artery M3 segment vessel. 3. Background intracranial sclerotic disease as described. 4. 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 CT perfusion head: The perfusion software identifies an 8 mL core infarct within the left MCA vascular territory. The perfusion software identifies a 56 mL region of critically hypoperfused parenchyma within the left MCA vascular territory (utilizing the Tmax>6 seconds threshold.). Reported mismatch volume: 48 mL. Electronically Signed   By: Jackey Loge  D.O.   On: 07/08/2023 19:18   CT HEAD CODE STROKE WO CONTRAST Result Date: 07/08/2023 CLINICAL DATA:  Code stroke. Provided history: Slurred speech. Right-sided weakness. Confusion. Inability to follow commands. EXAM: CT HEAD WITHOUT CONTRAST TECHNIQUE: Contiguous axial images were obtained from the base of the skull through the vertex without intravenous contrast. RADIATION DOSE REDUCTION: This exam was performed according to the departmental dose-optimization program which includes automated exposure control, adjustment of the mA and/or kV according to patient size and/or use of iterative reconstruction technique. COMPARISON:  Brain MRI 09/23/2022. FINDINGS: Brain: Generalized cerebral and cerebellar atrophy. Chronic infarcts within the left basal ganglia. Small chronic cortical infarct within the mid left frontal lobe (series 6, image  22). Background moderate patchy and ill-defined hypoattenuation within the cerebral white matter, nonspecific but compatible with chronic small vessel ischemic disease. There is no acute intracranial hemorrhage. No acute demarcated cortical infarct. No extra-axial fluid collection. No evidence of an intracranial mass. No midline shift. Vascular: Atherosclerotic calcifications. Vascular stent along the course of the left middle cerebral artery M1 and proximal M2 segments. No hyperdense vessel identified elsewhere. Skull: No calvarial fracture or aggressive osseous lesion. Sinuses/Orbits: No mass or acute finding within the imaged orbits. Minimal mucosal thickening within bilateral ethmoid air cells. ASPECTS [facility] Stroke Program Early CT Score) - Ganglionic level infarction (caudate, lentiform nuclei, internal capsule, insula, M1-M3 cortex): 7 - Supraganglionic infarction (M4-M6 cortex): 3 Total score (0-10 with 10 being normal): 10 (when discounting chronic infarcts). No evidence of an acute intracranial abnormality. These results were communicated to Dr. Amada Jupiter at 6:33  pmon 3/5/2025by text page via the [facility] messaging system. IMPRESSION: 1.  No evidence of an acute intracranial abnormality. 2. Parenchymal atrophy, chronic small vessel ischemic disease and chronic infarcts, as described. 3. Vascular stent along the course of the left middle cerebral artery M1 and M2 segments. Electronically Signed   By: Jackey Loge D.O.   On: 07/08/2023 18:37     PHYSICAL EXAM  Temp:  [98 F (36.7 C)-99.7 F (37.6 C)] 98.4 F (36.9 C) (03/07 1200) Pulse Rate:  [52-80] 78 (03/07 1100) Resp:  [14-23] 20 (03/07 1100) BP: (130-165)/(55-97) 165/91 (03/07 1100) SpO2:  [90 %-100 %] 95 % (03/07 1100) Weight:  [69.5 kg] 69.5 kg (03/07 0500)  General - Well nourished, well developed, in no apparent distress.  Ophthalmologic - fundi not visualized due to noncooperation.  Cardiovascular - irregularly irregular heart rate and rhythm.  Neuro - awake, alert, eyes open, orientated to age, place, time. No aphasia, paucity of language, following all simple commands. Able to name and repeat in mildly dysarthric voice. No gaze palsy, tracking bilaterally, visual field full, PERRL. No facial droop. Tongue midline. Bilateral UEs 4/5, no drift. Bilaterally LEs 3/5, no drift. Sensation symmetrical bilaterally, b/l FTN intact grossly, gait not tested.    ASSESSMENT/PLAN Ms. Melissa [name] is a 78 y.o. female with history of A-fib on Eliquis, hypertension, hyperlipidemia, diabetes, migraine, OSA not on CPAP, dementia, CHF, GBS, GI bleeding, stroke status post stenting on Brilinta admitted for right-sided weakness, slurred speech, right facial droop and aphasia. No TNK given due to on Eliquis.    Stroke:  left subinsular patchy and punctate left frontal infarcts, embolic likely secondary to A-fib not compliant with Eliquis CT no acute finding CT head and neck left M1 stent distal subocclusive occlusion.  Left M3 severe stenosis.  Bilateral ICA siphon atherosclerosis right more than  left Status post IR with TICI3 MRI left insular cortex patchy infarct, punctate left frontal infarct CT repeat no acute finding 2D Echo EF 65 to 70% LDL 68 HgbA1c 5.7 SCDs for VTE prophylaxis Brilinta (ticagrelor) 90 mg bid and Eliquis (apixaban) daily (but per pharmacy, no refill record for either of these medications) prior to admission, now on aspirin 81 mg daily.  Hold off Eliquis for today given severe anemia, will restart tomorrow if Hb stable Patient counseled to be compliant with her antithrombotic medications Ongoing aggressive stroke risk factor management Therapy recommendations: Pending Disposition: Pending  Severe anemia Right groin hemorrhage at incision site Patient denies any leg pain, abdominal pain Examination soft nontender abdomen Hemoglobin 8.5--6.4--2 x PRBC--7.6--7.6 Close CBC monitoring Low threshold for CT abdomen to  rule out retroperitoneal hemorrhage Hold off Eliquis again for today, continue aspirin for now and will restart eliquis tomorrow if Hb stable  History of stroke 09/2022 left MCA infarct with left M1 occlusion, status post IR with TICI3 and left MCA stenting.  EF 60 to 65%.  LDL 120, A1c 5.5.  Discharged on Brilinta and Eliquis and Crestor 40.  A-fib Rate controlled Home Eliquis but noncompliant per pharmacy record Now Eliquis on hold due to anemia Will consider Eliquis once hemoglobin stabilized  Diabetes HgbA1c 5.7 goal < 7.0 Controlled CBG monitoring SSI DM education and close PCP follow up  Hypertension Stable on the high end Resume home losartan 50 BP goal less than 180/105 now Long-term BP goal normotensive  Hyperlipidemia Home meds: Crestor 40 LDL 68, goal < 70 Now on Crestor 40 Continue statin at discharge  Dysphagia Did not pass swallow Has NG tube On tube feeding  Other Stroke Risk Factors Advanced age Migraines Obstructive sleep apnea  Other Active Problems History of GBS Dementia History of GI  bleeding  Hospital day # 2  This patient is critically ill due to left MCA stent occlusion status post thrombectomy, severe anemia, A-fib and at significant risk of neurological worsening, death form recurrent stroke, hemorrhagic transformation, shock, heart failure. This patient's care requires constant monitoring of vital signs, hemodynamics, respiratory and cardiac monitoring, review of multiple databases, neurological assessment, discussion with family, other specialists and medical decision making of high complexity. I spent 35 minutes of neurocritical care time in the care of this patient.  Marvel Plan, MD PhD Stroke Neurology 07/10/2023 12:34 PM    To contact Stroke Continuity provider, please refer to [email or web address]. After hours, contact General Neurology

## 2023-07-10 NOTE — Evaluation (Signed)
 Occupational Therapy Evaluation Patient Details Name: Melissa [name] MRN: [id number] DOB: 1945-05-23 Today's Date: 07/10/2023   History of Present Illness   The pt is a 78 yo female presenting 3/5 with  right-sided facial droop and weakness. Work up revealed: infarct of L MCA, s/p thrombectomy 3/5. Pt with bleeding from groin site 3/6. 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     Clinical Impressions Patient evaluated by Occupational Therapy with no further acute OT needs identified. All education has been completed and the patient has no further questions. Pt appears to be near baseline mobility at this time. Pt likes 1970s rock and roll and dancing in the room without UE support. Pt in chair listening to music. See below for any follow-up Occupational Therapy or equipment needs. OT to sign off. Thank you for referral.       If plan is discharge home, recommend the following:         Functional Status Assessment   Patient has had a recent decline in their functional status and demonstrates the ability to make significant improvements in function in a reasonable and predictable amount of time.     Equipment Recommendations   None recommended by OT     Recommendations for Other Services         Precautions/Restrictions   Precautions Precautions: Fall Restrictions Weight Bearing Restrictions Per Provider Order: No     Mobility Bed Mobility Overal bed mobility: Independent                  Transfers Overall transfer level: Needs assistance Equipment used: 1 person hand held assist Transfers: Sit to/from Stand Sit to Stand: Supervision           General transfer comment: pt relies on IV pole to transfer but this appears to be close to baseline deficits and using a cane      Balance Overall  balance assessment: Mild deficits observed, not formally tested                                         ADL either performed or assessed with clinical judgement   ADL Overall ADL's : Modified independent                                             Vision Baseline Vision/History: 1 Wears glasses Vision Assessment?: No apparent visual deficits     Perception         Praxis         Pertinent Vitals/Pain Pain Assessment Pain Assessment: No/denies pain (Simultaneous filing. User may not have seen previous data.)     Extremity/Trunk Assessment Upper Extremity Assessment Upper Extremity Assessment: LUE deficits/detail;Right hand dominant LUE Deficits / Details: 4th digit trigger finger pt reports "thats been like that"   Lower Extremity Assessment Lower Extremity Assessment: Defer to PT evaluation   Cervical / Trunk Assessment Cervical / Trunk Assessment: Normal   Communication Communication Communication: Impaired Factors Affecting Communication: Reduced clarity of speech;Hearing impaired   Cognition Arousal: Alert Behavior During Therapy: Flat affect Cognition: No family/caregiver present to determine baseline  OT - Cognition Comments: pt answering all questions about home setup and matching prior chart review. pt is HOH and needs repetition of questions at times                 Following commands: Intact       Cueing  General Comments   Cueing Techniques: Gestural cues  VSS   Exercises     Shoulder Instructions      Home Living Family/patient expects to be discharged to:: Private residence Living Arrangements: Children (Son) Available Help at Discharge: Family;Available 24 hours/day Type of Home: House Home Access: Stairs to enter [employer] of Steps: 5 Entrance Stairs-Rails: Right;Left Home Layout: One level     Bathroom Shower/Tub: Chief Strategy Officer:  Standard     Home Equipment: Agricultural consultant (2 wheels);Cane - single point;Wheelchair - Sport and exercise psychologist Comments: Lives with son who has epiliepsy but can provide 24 hr physical support.  Neither drive. bed bug noted on 07/09/23 admission. no animals      Prior Functioning/Environment Prior Level of Function : Needs assist             Mobility Comments: Ambulates with cane - could ambulate in community , reports using RW in the house ADLs Comments: son does the cleaning in the house, they shop together for groceries    OT Problem List:     OT Treatment/Interventions:        OT Goals(Current goals can be found in the care plan section)   Acute Rehab OT Goals Potential to Achieve Goals: Good   OT Frequency:       Co-evaluation PT/OT/SLP Co-Evaluation/Treatment: Yes Reason for Co-Treatment: For patient/therapist safety;To address functional/ADL transfers;Necessary to address cognition/behavior during functional activity   OT goals addressed during session: ADL's and self-care;Proper use of Adaptive equipment and DME;Strengthening/ROM      AM-PAC OT "6 Clicks" Daily Activity     Outcome Measure Help from another person eating meals?: None Help from another person taking care of personal grooming?: None Help from another person toileting, which includes using toliet, bedpan, or urinal?: None Help from another person bathing (including washing, rinsing, drying)?: None Help from another person to put on and taking off regular upper body clothing?: None Help from another person to put on and taking off regular lower body clothing?: None 6 Click Score: 24   End of Session Equipment Utilized During Treatment: Gait belt Nurse Communication: Mobility status;Precautions  Activity Tolerance: Patient tolerated treatment well Patient left: in chair;with call bell/phone within reach;with chair alarm set  OT Visit Diagnosis: Unsteadiness on feet (R26.81)                 Time: 1235-1250 OT Time Calculation (min): 15 min Charges:  OT General Charges $OT Visit: 1 Visit OT Evaluation $OT Eval Low Complexity: 1 Low   Brynn, OTR/L  Acute Rehabilitation Services Office: [phone] .   Mateo Flow 07/10/2023, 1:55 PM

## 2023-07-10 NOTE — Plan of Care (Signed)
  Problem: Ischemic Stroke/TIA Tissue Perfusion: Goal: Complications of ischemic stroke/TIA will be minimized Outcome: Progressing   Problem: Coping: Goal: Will verbalize positive feelings about self Outcome: Progressing Goal: Will identify appropriate support needs Outcome: Progressing   Problem: Health Behavior/Discharge Planning: Goal: Goals will be collaboratively established with patient/family Outcome: Progressing   Problem: Self-Care: Goal: Ability to communicate needs accurately will improve Outcome: Progressing   Problem: Nutrition: Goal: Dietary intake will improve Outcome: Progressing   Problem: Clinical Measurements: Goal: Ability to maintain clinical measurements within normal limits will improve Outcome: Progressing Goal: Respiratory complications will improve Outcome: Progressing Goal: Cardiovascular complication will be avoided Outcome: Progressing   Problem: Coping: Goal: Level of anxiety will decrease Outcome: Progressing   Problem: Pain Managment: Goal: General experience of comfort will improve and/or be controlled Outcome: Progressing   Problem: Cardiovascular: Goal: Vascular access site(s) Level 0-1 will be maintained Outcome: Progressing

## 2023-07-10 NOTE — Evaluation (Signed)
 Speech Language Pathology Evaluation Patient Details Name: SEBASTIAN DZIK MRN: [id number] DOB: [birth year] Today's Date: 07/10/2023 Time: 4098-1191 SLP Time Calculation (min) (ACUTE ONLY): 23 min  Problem List:  Patient Active Problem List   Diagnosis Date Noted   Acute ischemic left MCA stroke (HCC) 07/08/2023   Middle cerebral artery stenosis, 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   Stroke (cerebrum) (HCC) 09/20/2022   Middle cerebral artery embolism, 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   Hematemesis 12/07/2017   Acute blood loss anemia 12/07/2017   Dementia (HCC) 12/07/2017   CHF (congestive heart failure) (HCC) 12/07/2017   COPD (chronic obstructive pulmonary disease) (HCC) 12/07/2017   History of completed stroke 12/07/2017   Unspecified atrial fibrillation (HCC) 12/07/2017   CKD (chronic kidney disease), stage III (HCC) 12/07/2017   History of diet-controlled diabetes 12/07/2017   Chronic bilateral low back pain without sciatica 10/02/2016   Gastroesophageal reflux disease without esophagitis 08/26/2016   GAD (generalized anxiety disorder) 07/22/2016   Depression 03/19/2016   Insomnia 03/19/2016   Coronary artery disease involving native coronary artery of native heart without angina pectoris 10/05/2014   Mixed hyperlipidemia 10/05/2014   Past Medical History:  Past Medical History:  Diagnosis Date   Anemia    Anxiety    Arthritis    "all over" (12/08/2017)   Asthma    CHF (congestive heart failure) (HCC)    Chronic lower back pain    COPD (chronic obstructive pulmonary disease) (HCC)    Dementia (HCC)    Depression    Diabetes mellitus without complication (HCC)    Fibromyalgia    Gastric ulcer    GERD (gastroesophageal reflux disease)    Guillain Barr syndrome (HCC)    Hematemesis 12/07/2017   History of blood transfusion    "low blood" (12/08/2017)   History of stomach ulcers    Hypercholesterolemia    Hypertension    Migraine    "used to have them; don't have them anymore" (12/08/2017)   Pneumonia     "probably twice" (12/08/2017)   Sleep apnea    "couldn't take the mask" (12/08/2017)   Stroke (HCC)    "I don't remember if I have had one or not; I believe I have" (12/08/2017)   Past Surgical History:  Past Surgical History:  Procedure Laterality Date   ABDOMINAL HERNIA REPAIR     ABDOMINAL HYSTERECTOMY     APPENDECTOMY     BIOPSY  12/08/2017   Procedure: BIOPSY;  Surgeon: Lynann Bologna, MD;  Location: [facility] ENDOSCOPY;  Service: Endoscopy;;   BREAST SURGERY Left    "tumors removed; not cancer"   BUNIONECTOMY Bilateral    CARPAL TUNNEL RELEASE Bilateral    CATARACT EXTRACTION, BILATERAL Bilateral    CHOLECYSTECTOMY     COLONOSCOPY  03/03/2011   Mild sigmoid diverticulosis. Internal hemorrhoids.    DILATION AND CURETTAGE OF UTERUS     ELBOW FRACTURE SURGERY Left    ESOPHAGOGASTRODUODENOSCOPY N/A 12/08/2017   Procedure: ESOPHAGOGASTRODUODENOSCOPY (EGD);  Surgeon: Lynann Bologna, MD;  Location: [facility] ENDOSCOPY;  Service: Endoscopy;  Laterality: N/A;   ESOPHAGOGASTRODUODENOSCOPY ENDOSCOPY  12/08/2017   w/bx   FRACTURE SURGERY     HERNIA REPAIR     HIP FRACTURE SURGERY Left    IR CT HEAD LTD  09/20/2022   IR CT HEAD LTD  09/20/2022   IR CT HEAD LTD  07/08/2023   IR PERCUTANEOUS ART THROMBECTOMY/INFUSION INTRACRANIAL INC DIAG ANGIO  09/20/2022   IR PERCUTANEOUS ART THROMBECTOMY/INFUSION INTRACRANIAL INC DIAG ANGIO  07/08/2023   KNEE ARTHROSCOPY Right  RADIOLOGY WITH ANESTHESIA N/A 09/20/2022   Procedure: IR WITH ANESTHESIA;  Surgeon: Julieanne Cotton, MD;  Location: [facility] OR;  Service: Radiology;  Laterality: N/A;   RADIOLOGY WITH ANESTHESIA N/A 07/08/2023   Procedure: RADIOLOGY WITH ANESTHESIA;  Surgeon: Radiologist, Medication, MD;  Location: [facility] OR;  Service: Radiology;  Laterality: N/A;   SHOULDER OPEN ROTATOR CUFF REPAIR Bilateral    TONSILLECTOMY     HPI:  Pt is 78 y.o. female who was admitted on 03/05 due to right sided weakness, aphasia, and slurred speech. MRI displayed "small acute infarct  in the left insula and a few punctate acute  infarcts in the left frontal lobe". PMHx includes: prior left MCA infarct May 2024, Afib, DM, HLD, HTN, migraines, sleep apnea, anxiety/depression, dementia, COPD, CHF, fibromyalgia, GERD, Guillain Barr, and bed bugs.   Assessment / Plan / Recommendation Clinical Impression  Pt was seen for SLE to determine need for intervention. Pt presents with mild-moderate cognitive-communication deficits primarily characterized by memory impairment. Pt's expressive-receptive language and motor speech appear WFL.   Pt was disoriented to time and situation. Pt's focused and sustained attention were consistent during session. Selective attention was attempted with tv on in background and pt completed yes/no questions with 100% consistency. Pt needed consistent repetitions for instructions to tasks and could not remember 2/5 novel words, even when presented categorical cues and multiple choice. After repetitions of instruction, pt could perform tasks accurately. Pt displayed basic problem solving skills when given scenarios (what if your house was flooded from a busted pipe?) Pt also exhibited safety awareness when given a scenario; however, memory impairment can impact safety awareness.   Pt does not need SLP f/u at this level of care. Pt needs frequent supervision for medications and other needs from staff due to memory impairment. Recommend SLP f/u in next level of care to target memory for self-care and to promote overall wellbeing.    SLP Assessment  SLP Recommendation/Assessment: All further Speech Lanaguage Pathology  needs can be addressed in the next venue of care SLP Visit Diagnosis: Cognitive communication deficit (R41.841)    Recommendations for follow up therapy are one component of a multi-disciplinary discharge planning process, led by the attending physician.  Recommendations may be updated based on patient status, additional functional criteria and  insurance authorization.    Follow Up Recommendations  Follow physician's recommendations for discharge plan and follow up therapies    Assistance Recommended at Discharge  Frequent or constant Supervision/Assistance (needs consistent supervision for medications due to memory impairment.)  Functional Status Assessment    Frequency and Duration           SLP Evaluation Cognition  Overall Cognitive Status: Impaired/Different from baseline Arousal/Alertness: Awake/alert Orientation Level: Oriented to place;Oriented to person;Disoriented to time;Disoriented to situation Day of Week: Incorrect Attention: Focused;Sustained Focused Attention: Appears intact Sustained Attention: Appears intact Memory: Impaired Memory Impairment: Decreased short term memory;Decreased recall of new information Decreased Short Term Memory: Verbal basic;Verbal complex Problem Solving: Appears intact Safety/Judgment: Appears intact       Comprehension  Auditory Comprehension Overall Auditory Comprehension: Appears within functional limits for tasks assessed Yes/No Questions: Within Functional Limits Commands: Impaired Complex Commands: 50-74% accurate Conversation: Simple Interfering Components: Working Radio broadcast assistant: Producer, television/film/video Discrimination: Not tested Reading Comprehension Reading Status: Not tested    Expression Expression Primary Mode of Expression: Verbal Verbal Expression Overall Verbal Expression: Appears within functional limits for tasks assessed Initiation: No impairment Level of Generative/Spontaneous Verbalization: Sentence;Conversation Repetition: No  impairment Naming: No impairment Pragmatics: No impairment   Oral / Motor  Oral Motor/Sensory Function Overall Oral Motor/Sensory Function: Mild impairment (See BSE from 03/07) Facial ROM: Reduced right Facial Symmetry: Abnormal symmetry right Lingual Symmetry:  Within Functional Limits Velum: Within Functional Limits Mandible: Within Functional Limits Motor Speech Overall Motor Speech: Appears within functional limits for tasks assessed            Rowe Robert 07/10/2023, 12:34 PM

## 2023-07-10 NOTE — Evaluation (Signed)
 Clinical/Bedside Swallow Evaluation Patient Details  Name: Melissa [name] MRN: [id number] Date of Birth: 05-31-1945  Today's Date: 07/10/2023 Time: SLP Start Time (ACUTE ONLY): 1050 SLP Stop Time (ACUTE ONLY): 1105 SLP Time Calculation (min) (ACUTE ONLY): 15 min  Past Medical History:  Past Medical History:  Diagnosis Date   Anemia    Anxiety    Arthritis    "all over" (12/08/2017)   Asthma    CHF (congestive heart failure) (HCC)    Chronic lower back pain    COPD (chronic obstructive pulmonary disease) (HCC)    Dementia (HCC)    Depression    Diabetes mellitus without complication (HCC)    Fibromyalgia    Gastric ulcer    GERD (gastroesophageal reflux disease)    Guillain Barr syndrome (HCC)    Hematemesis 12/07/2017   History of blood transfusion    "low blood" (12/08/2017)   History of stomach ulcers    Hypercholesterolemia    Hypertension    Migraine    "used to have them; don't have them anymore" (12/08/2017)   Pneumonia    "probably twice" (12/08/2017)   Sleep apnea    "couldn't take the mask" (12/08/2017)   Stroke (HCC)    "I don't remember if I have had one or not; I believe I have" (12/08/2017)   Past Surgical History:  Past Surgical History:  Procedure Laterality Date   ABDOMINAL HERNIA REPAIR     ABDOMINAL HYSTERECTOMY     APPENDECTOMY     BIOPSY  12/08/2017   Procedure: BIOPSY;  Surgeon: Lynann Bologna, MD;  Location: [facility] ENDOSCOPY;  Service: Endoscopy;;   BREAST SURGERY Left    "tumors removed; not cancer"   BUNIONECTOMY Bilateral    CARPAL TUNNEL RELEASE Bilateral    CATARACT EXTRACTION, BILATERAL Bilateral    CHOLECYSTECTOMY     COLONOSCOPY  03/03/2011   Mild sigmoid diverticulosis. Internal hemorrhoids.    DILATION AND CURETTAGE OF UTERUS     ELBOW FRACTURE SURGERY Left    ESOPHAGOGASTRODUODENOSCOPY N/A 12/08/2017   Procedure: ESOPHAGOGASTRODUODENOSCOPY (EGD);  Surgeon: Lynann Bologna, MD;  Location: [facility] ENDOSCOPY;  Service: Endoscopy;  Laterality: N/A;    ESOPHAGOGASTRODUODENOSCOPY ENDOSCOPY  12/08/2017   w/bx   FRACTURE SURGERY     HERNIA REPAIR     HIP FRACTURE SURGERY Left    IR CT HEAD LTD  09/20/2022   IR CT HEAD LTD  09/20/2022   IR CT HEAD LTD  07/08/2023   IR PERCUTANEOUS ART THROMBECTOMY/INFUSION INTRACRANIAL INC DIAG ANGIO  09/20/2022   IR PERCUTANEOUS ART THROMBECTOMY/INFUSION INTRACRANIAL INC DIAG ANGIO  07/08/2023   KNEE ARTHROSCOPY Right    RADIOLOGY WITH ANESTHESIA N/A 09/20/2022   Procedure: IR WITH ANESTHESIA;  Surgeon: Julieanne Cotton, MD;  Location: [facility] OR;  Service: Radiology;  Laterality: N/A;   RADIOLOGY WITH ANESTHESIA N/A 07/08/2023   Procedure: RADIOLOGY WITH ANESTHESIA;  Surgeon: Radiologist, Medication, MD;  Location: [facility] OR;  Service: Radiology;  Laterality: N/A;   SHOULDER OPEN ROTATOR CUFF REPAIR Bilateral    TONSILLECTOMY     HPI:  Pt is 78 y.o. female who was admitted on 03/05 due to right sided weakness, aphasia, and slurred speech. MRI displayed "small acute infarct in the left insula and a few punctate acute  infarcts in the left frontal lobe". PMHx includes: prior left MCA infarct May 2024, Afib, DM, HLD, HTN, migraines, sleep apnea, anxiety/depression, dementia, COPD, CHF, fibromyalgia, GERD, Guillain Barr, and bed bugs.    Assessment / Plan / Recommendation  Clinical Impression  Pt was seen for BSE to determine need for intervention. Pt displayed minimal signs of aspiration/dysphagia across consistencies.  When administering a 3 oz water test, pt experienced immediate coughing. However, pt notes that she takes small sips at baseline. Pt took small sips for other thin-liquid trials and exhibited no signs of aspiration. Pt put in dentures after solid trial due to prolonged mastication.   Recommend regular and thin liquid diet due to minimal signs of aspiration and pt's current use of small sips with thin-liquids. No SLP f/u for swallowing at this time.      Aspiration Risk  No limitations    Diet  Recommendation Regular;Thin liquid    Liquid Administration via: Straw;Cup Medication Administration: Whole meds with puree Supervision: Patient able to self feed;Staff to assist with self feeding (Pt had some issues with self-feeding; mostly due to NG tube in front of her mouth.) Compensations: Small sips/bites;Slow rate Postural Changes: Seated upright at 90 degrees    Other  Recommendations Oral Care Recommendations: Oral care BID    Recommendations for follow up therapy are one component of a multi-disciplinary discharge planning process, led by the attending physician.  Recommendations may be updated based on patient status, additional functional criteria and insurance authorization.  Follow up Recommendations No SLP follow up      Assistance Recommended at Discharge    Functional Status Assessment    Frequency and Duration            Prognosis        Swallow Study   General Date of Onset: 07/08/23 HPI: Pt is 78 y.o. female who was admitted on 03/05 due to right sided weakness, aphasia, and slurred speech. MRI displayed "small acute infarct in the left insula and a few punctate acute  infarcts in the left frontal lobe". PMHx includes: prior left MCA infarct May 2024, Afib, DM, HLD, HTN, migraines, sleep apnea, anxiety/depression, dementia, COPD, CHF, fibromyalgia, GERD, Guillain Barr, and bed bugs. Type of Study: Bedside Swallow Evaluation Diet Prior to this Study: NPO;Large bore NG tube Temperature Spikes Noted: No Respiratory Status: Room air History of Recent Intubation: Yes Total duration of intubation (days): 0 days Date extubated: 07/08/23 Behavior/Cognition: Alert;Cooperative;Pleasant mood;Requires cueing Oral Cavity Assessment: Within Functional Limits Oral Care Completed by SLP: No Oral Cavity - Dentition: Edentulous Vision: Functional for self-feeding Self-Feeding Abilities: Needs assist;Able to feed self Patient Positioning: Upright in bed Baseline Vocal  Quality: Normal Volitional Swallow: Able to elicit    Oral/Motor/Sensory Function Overall Oral Motor/Sensory Function: Mild impairment Facial ROM: Reduced right Facial Symmetry: Abnormal symmetry right Lingual Symmetry: Within Functional Limits Velum: Within Functional Limits Mandible: Within Functional Limits   Ice Chips Ice chips: Within functional limits Presentation: Spoon   Thin Liquid Thin Liquid: Impaired Presentation: Cup;Straw;Self Fed Pharyngeal  Phase Impairments: Cough - Immediate    Nectar Thick Nectar Thick Liquid: Not tested   Honey Thick Honey Thick Liquid: Not tested   Puree Puree: Within functional limits Presentation: Spoon   Solid     Solid: Within functional limits Other Comments: Prologned mastication due to dentures not being in. Pt has top dentures.      Rowe Robert 07/10/2023,12:01 PM

## 2023-07-11 DIAGNOSIS — I63512 Cerebral infarction due to unspecified occlusion or stenosis of left middle cerebral artery: Secondary | ICD-10-CM | POA: Diagnosis not present

## 2023-07-11 LAB — GLUCOSE, CAPILLARY
Glucose-Capillary: 97 mg/dL (ref 70–99)
Glucose-Capillary: 131 mg/dL — ABNORMAL HIGH (ref 70–99)
Glucose-Capillary: 103 mg/dL — ABNORMAL HIGH (ref 70–99)
Glucose-Capillary: 103 mg/dL — ABNORMAL HIGH (ref 70–99)
Glucose-Capillary: 99 mg/dL (ref 70–99)

## 2023-07-11 LAB — CBC
HCT: 22.5 % — ABNORMAL LOW (ref 36.0–46.0)
Hemoglobin: 6.9 g/dL — CL (ref 12.0–15.0)
MCH: 22.9 pg — ABNORMAL LOW (ref 26.0–34.0)
MCHC: 30.7 g/dL (ref 30.0–36.0)
MCV: 74.8 fL — ABNORMAL LOW (ref 80.0–100.0)
Platelets: 297 10*3/uL (ref 150–400)
RBC: 3.01 MIL/uL — ABNORMAL LOW (ref 3.87–5.11)
RDW: 17.3 % — ABNORMAL HIGH (ref 11.5–15.5)
WBC: 6.3 10*3/uL (ref 4.0–10.5)
nRBC: 0 % (ref 0.0–0.2)

## 2023-07-11 LAB — BASIC METABOLIC PANEL
Anion gap: 6 (ref 5–15)
BUN: 13 mg/dL (ref 8–23)
CO2: 23 mmol/L (ref 22–32)
Calcium: 8.3 mg/dL — ABNORMAL LOW (ref 8.9–10.3)
Chloride: 106 mmol/L (ref 98–111)
Creatinine, Ser: 0.97 mg/dL (ref 0.44–1.00)
GFR, Estimated: 60 mL/min — ABNORMAL LOW (ref >60)
Glucose, Bld: 97 mg/dL (ref 70–99)
Potassium: 4.3 mmol/L (ref 3.5–5.1)
Sodium: 135 mmol/L (ref 135–145)

## 2023-07-11 LAB — PREPARE RBC (CROSSMATCH)

## 2023-07-11 MED ORDER — SODIUM CHLORIDE 0.9% IV SOLUTION
Freq: Once | INTRAVENOUS | Status: AC
  Administered 2023-07-11: 10 mL/h via INTRAVENOUS

## 2023-07-11 NOTE — Plan of Care (Signed)
   Problem: Education: Goal: Knowledge of disease or condition will improve Outcome: Progressing

## 2023-07-11 NOTE — Plan of Care (Signed)
  Problem: Education: Goal: Knowledge of disease or condition will improve Outcome: Not Progressing Goal: Knowledge of secondary prevention will improve (MUST DOCUMENT ALL) Outcome: Not Progressing Goal: Knowledge of patient specific risk factors will improve (DELETE if not current risk factor) Outcome: Not Progressing   Problem: Ischemic Stroke/TIA Tissue Perfusion: Goal: Complications of ischemic stroke/TIA will be minimized Outcome: Not Progressing   Problem: Nutrition: Goal: Adequate nutrition will be maintained Outcome: Not Progressing   Problem: Coping: Goal: Level of anxiety will decrease Outcome: Not Progressing   Problem: Safety: Goal: Ability to remain free from injury will improve Outcome: Not Progressing

## 2023-07-11 NOTE — Progress Notes (Addendum)
 STROKE TEAM PROGRESS NOTE   SUBJECTIVE (INTERVAL HISTORY) No family is at the bedside.  Pt lying in bed, AAO x 3 today, much awake alert than yesterday. Right hemiparesis much improved.   Transfuse 1u PRBC today due to hemoglobin being 6.9  OBJECTIVE Temp:  [98.1 F (36.7 C)-99 F (37.2 C)] 99 F (37.2 C) (03/08 0418) Pulse Rate:  [61-78] 71 (03/08 0418) Cardiac Rhythm: Atrial fibrillation (03/08 0702) Resp:  [18-22] 18 (03/08 0418) BP: (141-167)/(52-108) 163/83 (03/08 0418) SpO2:  [94 %-96 %] 95 % (03/08 0418)  Recent Labs  Lab 07/10/23 0013 07/10/23 0319 07/10/23 0753 07/10/23 1153 07/11/23 0621  GLUCAP 130* 131* 135* 143* 97   Recent Labs  Lab 07/08/23 1814 07/09/23 0124 07/09/23 1213 07/10/23 0528 07/11/23 0624  NA 138 137  --  136 135  K 3.7 3.8  --  3.5 4.3  CL 107 105  --  107 106  CO2  --  22  --  23 23  GLUCOSE 100* 110*  --  122* 97  BUN 4* <5*  --  9 13  CREATININE 1.00 0.83  --  0.89 0.97  CALCIUM  --  8.6*  --  8.3* 8.3*  MG  --   --  1.6* 1.7  --   PHOS  --   --  3.1 3.5  --    Recent Labs  Lab 07/09/23 0124  AST 19  ALT 12  ALKPHOS 61  BILITOT 1.0  PROT 5.8*  ALBUMIN 3.1*   Recent Labs  Lab 07/09/23 0124 07/09/23 0227 07/09/23 1552 07/10/23 0528 07/11/23 0624  WBC 7.0  --   --  6.7 6.3  NEUTROABS 5.8  --   --   --   --   HGB 6.4* 6.4* 7.6* 7.6* 6.9*  HCT 21.7* 21.6* 24.5* 24.4* 22.5*  MCV 72.6*  --   --  73.9* 74.8*  PLT 368  --   --  328 297   No results for input(s): "CKTOTAL", "CKMB", "CKMBINDEX", "TROPONINI" in the last 168 hours. Recent Labs    07/09/23 0124  LABPROT 13.7  INR 1.0   No results for input(s): "COLORURINE", "LABSPEC", "PHURINE", "GLUCOSEU", "HGBUR", "BILIRUBINUR", "KETONESUR", "PROTEINUR", "UROBILINOGEN", "NITRITE", "LEUKOCYTESUR" in the last 72 hours.  Invalid input(s): "APPERANCEUR"     Component Value Date/Time   CHOL 128 07/09/2023 0124   TRIG 59 07/09/2023 0124   HDL 48 07/09/2023 0124   CHOLHDL  2.7 07/09/2023 0124   VLDL 12 07/09/2023 0124   LDLCALC 68 07/09/2023 0124   Lab Results  Component Value Date   HGBA1C 5.7 (H) 07/09/2023   No results found for: "LABOPIA", "COCAINSCRNUR", "LABBENZ", "AMPHETMU", "THCU", "LABBARB"  Recent Labs  Lab 07/09/23 0124  ETH <10    I have personally reviewed the radiological images below and agree with the radiology interpretations.  IR PERCUTANEOUS ART THROMBECTOMY/INFUSION INTRACRANIAL INC DIAG ANGIO Result Date: 07/10/2023 INDICATION: New onset aphasia and right-sided weakness. 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 EXAM: 1. EMERGENT LARGE VESSEL OCCLUSION THROMBOLYSIS (anterior CIRCULATION) COMPARISON:  CT angiogram of the head and neck July 08, 2023. MEDICATIONS: No antibiotic was administered within 1 hour of the procedure. ANESTHESIA/SEDATION: General anesthesia. CONTRAST:  Omnipaque 300 approximately 75 cc. FLUOROSCOPY TIME:  Fluoroscopy Time: 9 minutes 54 seconds (812 mGy). COMPLICATIONS: None immediate. TECHNIQUE: Following a full explanation of the procedure along with the potential associated complications, an informed witnessed consent was obtained.  The risks of intracranial hemorrhage of 10%, worsening neurological deficit, ventilator dependency, death and inability to revascularize were all reviewed in detail with the patient's son. The patient was then put under general anesthesia by the Department of Anesthesiology at [facility]. The right groin was prepped and draped in the usual sterile fashion. Thereafter using modified Seldinger technique, transfemoral access into the right common femoral artery was obtained without difficulty. Over an 0.035 inch guidewire an 8 French 25 cm Pinnacle sheath was inserted. Through this, and also over an 0.035 inch guidewire a combination of an 088 100 cm Zoom support catheter with a 125 cm 6 French Simmons 2 support catheter was  advanced to the aortic arch region, and selectively positioned in the left common carotid artery, and the distal left internal carotid artery. FINDINGS: The left common carotid arteriogram demonstrates the left external carotid artery and its major branches to be widely patent. The left internal carotid to the skull base is widely patent with a moderate to severe tortuosity of the mid cervical ICA. The petrous, the cavernous and the supraclinoid left ICA are widely patent with mild stenosis in the caval segment. A left posterior communicating artery is seen opacifying the left posterior cerebral artery distribution. The left anterior cerebral artery opacifies into the capillary and venous phases. The left middle cerebral artery demonstrates a stable moderate stenosis in the mid M1 segment with near complete occlusion distally at the level of the trifurcation branches. PROCEDURE: Through the 088 support catheter in the distal cervical ICA, a combination of an 062 Penumbra aspiration catheter with an 043 distal aspiration catheter was advanced over an 035 inch colossal guidewire to the supraclinoid left ICA. The wire was then advanced through the occluded stented segment of the middle cerebral artery into the distal stented segment in the superior division followed by advancement of the 043 and the 062 aspiration catheters into the occluded stent. The wire was removed. Aspiration was then applied for about 30 seconds at the hub of the 043 aspiration catheter, with a Penumbra aspiration pump at the hub of the 062 aspiration catheter for approximately 2 minutes. Following retrieval of the two aspiration catheters, a control arteriogram performed through the 088 support catheter demonstrated complete revascularization of the left middle cerebral artery distribution achieving a TICI 3 revascularization. Also noted was patency of the stented segment and also of the proximal left middle cerebral artery M1 segment  significantly improved compared to the pre treatment caliber. Control arteriogram was then performed at 10, 20 and 35 minutes post revascularization. These continued to demonstrate improved caliber of the left middle cerebral artery distribution. The patient was given a total of 4.5 mg of intra arterial Integrilin in order to prevent platelet aggregation within the stent. A final control arteriogram performed through the 088 support catheter in the left common carotid artery demonstrates the left internal carotid artery to be widely patent to the cranial skull base with no change in the tortuosity. Intracranially, the left middle cerebral artery and the left anterior cerebral artery distributions continued to demonstrate wide patency and TICI 3 revascularization. The 088 support catheter was removed. An 8 French Angio-Seal closure device was used for hemostasis at the right groin puncture site. Distal pulses remained present bilaterally unchanged from prior to the procedure. A flat panel CT of the brain demonstrated no evidence of hemorrhagic complications. The patient was extubated. Upon recovery, the patient followed simple commands intermittently appropriately. Movement was noted in the right upper and  right lower extremity. Patient was then transferred to the neuro ICU for post revascularization care. IMPRESSION: Status post complete revascularization of near complete occlusion of the left middle cerebral artery stented segment with 1 pass with a combination of 062 and 043 aspiration catheters achieving a TICI 3 revascularization. PLAN: As per referring MD. Electronically Signed   By: Julieanne Cotton M.D.   On: 07/10/2023 08:50   IR CT Head Ltd Result Date: 07/10/2023 INDICATION: New onset aphasia and right-sided weakness. 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 EXAM: 1. EMERGENT LARGE VESSEL OCCLUSION THROMBOLYSIS (anterior CIRCULATION)  COMPARISON:  CT angiogram of the head and neck July 08, 2023. MEDICATIONS: No antibiotic was administered within 1 hour of the procedure. ANESTHESIA/SEDATION: General anesthesia. CONTRAST:  Omnipaque 300 approximately 75 cc. FLUOROSCOPY TIME:  Fluoroscopy Time: 9 minutes 54 seconds (812 mGy). COMPLICATIONS: None immediate. TECHNIQUE: Following a full explanation of the procedure along with the potential associated complications, an informed witnessed consent was obtained. The risks of intracranial hemorrhage of 10%, worsening neurological deficit, ventilator dependency, death and inability to revascularize were all reviewed in detail with the patient's son. The patient was then put under general anesthesia by the Department of Anesthesiology at [facility]. The right groin was prepped and draped in the usual sterile fashion. Thereafter using modified Seldinger technique, transfemoral access into the right common femoral artery was obtained without difficulty. Over an 0.035 inch guidewire an 8 French 25 cm Pinnacle sheath was inserted. Through this, and also over an 0.035 inch guidewire a combination of an 088 100 cm Zoom support catheter with a 125 cm 6 French Simmons 2 support catheter was advanced to the aortic arch region, and selectively positioned in the left common carotid artery, and the distal left internal carotid artery. FINDINGS: The left common carotid arteriogram demonstrates the left external carotid artery and its major branches to be widely patent. The left internal carotid to the skull base is widely patent with a moderate to severe tortuosity of the mid cervical ICA. The petrous, the cavernous and the supraclinoid left ICA are widely patent with mild stenosis in the caval segment. A left posterior communicating artery is seen opacifying the left posterior cerebral artery distribution. The left anterior cerebral artery opacifies into the capillary and venous phases. The left middle cerebral  artery demonstrates a stable moderate stenosis in the mid M1 segment with near complete occlusion distally at the level of the trifurcation branches. PROCEDURE: Through the 088 support catheter in the distal cervical ICA, a combination of an 062 Penumbra aspiration catheter with an 043 distal aspiration catheter was advanced over an 035 inch colossal guidewire to the supraclinoid left ICA. The wire was then advanced through the occluded stented segment of the middle cerebral artery into the distal stented segment in the superior division followed by advancement of the 043 and the 062 aspiration catheters into the occluded stent. The wire was removed. Aspiration was then applied for about 30 seconds at the hub of the 043 aspiration catheter, with a Penumbra aspiration pump at the hub of the 062 aspiration catheter for approximately 2 minutes. Following retrieval of the two aspiration catheters, a control arteriogram performed through the 088 support catheter demonstrated complete revascularization of the left middle cerebral artery distribution achieving a TICI 3 revascularization. Also noted was patency of the stented segment and also of the proximal left middle cerebral artery M1 segment significantly improved compared to the pre  treatment caliber. Control arteriogram was then performed at 10, 20 and 35 minutes post revascularization. These continued to demonstrate improved caliber of the left middle cerebral artery distribution. The patient was given a total of 4.5 mg of intra arterial Integrilin in order to prevent platelet aggregation within the stent. A final control arteriogram performed through the 088 support catheter in the left common carotid artery demonstrates the left internal carotid artery to be widely patent to the cranial skull base with no change in the tortuosity. Intracranially, the left middle cerebral artery and the left anterior cerebral artery distributions continued to demonstrate wide  patency and TICI 3 revascularization. The 088 support catheter was removed. An 8 French Angio-Seal closure device was used for hemostasis at the right groin puncture site. Distal pulses remained present bilaterally unchanged from prior to the procedure. A flat panel CT of the brain demonstrated no evidence of hemorrhagic complications. The patient was extubated. Upon recovery, the patient followed simple commands intermittently appropriately. Movement was noted in the right upper and right lower extremity. Patient was then transferred to the neuro ICU for post revascularization care. IMPRESSION: Status post complete revascularization of near complete occlusion of the left middle cerebral artery stented segment with 1 pass with a combination of 062 and 043 aspiration catheters achieving a TICI 3 revascularization. PLAN: As per referring MD. Electronically Signed   By: Julieanne Cotton M.D.   On: 07/10/2023 08:50   ECHOCARDIOGRAM COMPLETE Result Date: 07/09/2023    ECHOCARDIOGRAM REPORT   Patient Name:   BLAYKE PINERA Date of Exam: 07/09/2023 Medical Rec #:  [id number]        Height:       62.0 in Accession #:    [id number]       Weight:       153.9 lb Date of Birth:  1945-07-14        BSA:          1.710 m Patient Age:    77 years         BP:           112/88 mmHg Patient Gender: F                HR:           57 bpm. Exam Location:  Inpatient Procedure: 2D Echo, Cardiac Doppler and Color Doppler (Both Spectral and Color            Flow Doppler were utilized during procedure). Indications:    Stroke I63.9  History:        Patient has prior history of Echocardiogram examinations, most                 recent 06/29/2023. CHF, CAD, Stroke, CKD 3 and COPD; Risk                 Factors:Dyslipidemia and Non-Smoker.  Sonographer:    Dondra Prader RVT RCS Referring Phys: [id number] Lynnae January IMPRESSIONS  1. Left ventricular ejection fraction, by estimation, is 65 to 70%. The left ventricle has normal function. The left  ventricle has no regional wall motion abnormalities. Left ventricular diastolic parameters were normal.  2. Right ventricular systolic function is normal. The right ventricular size is normal.  3. Left atrial size was severely dilated.  4. The mitral valve is normal in structure. Mild mitral valve regurgitation.  5. The aortic valve is tricuspid. Aortic valve regurgitation is not visualized. Aortic valve sclerosis/calcification is present, without  any evidence of aortic stenosis. Comparison(s): The left ventricular function is unchanged. FINDINGS  Left Ventricle: Left ventricular ejection fraction, by estimation, is 65 to 70%. The left ventricle has normal function. The left ventricle has no regional wall motion abnormalities. The left ventricular internal cavity size was normal in size. There is  no left ventricular hypertrophy. Left ventricular diastolic parameters were normal. Right Ventricle: The right ventricular size is normal. Right vetricular wall thickness was not assessed. Right ventricular systolic function is normal. Left Atrium: Left atrial size was severely dilated. Right Atrium: Right atrial size was normal in size. Pericardium: Trivial pericardial effusion is present. Mitral Valve: The mitral valve is normal in structure. Mild mitral valve regurgitation. Tricuspid Valve: The tricuspid valve is normal in structure. Tricuspid valve regurgitation is mild. Aortic Valve: The aortic valve is tricuspid. Aortic valve regurgitation is not visualized. Aortic valve sclerosis/calcification is present, without any evidence of aortic stenosis. Aortic valve mean gradient measures 6.0 mmHg. Aortic valve peak gradient measures 11.1 mmHg. Aortic valve area, by VTI measures 2.30 cm. Pulmonic Valve: The pulmonic valve was normal in structure. Pulmonic valve regurgitation is not visualized. Aorta: The aortic root and ascending aorta are structurally normal, with no evidence of dilitation. IAS/Shunts: No atrial level  shunt detected by color flow Doppler.  LEFT VENTRICLE PLAX 2D LVIDd:         4.40 cm   Diastology LVIDs:         2.80 cm   LV e' medial:    8.11 cm/s LV PW:         1.20 cm   LV E/e' medial:  20.4 LV IVS:        0.90 cm   LV e' lateral:   7.26 cm/s LVOT diam:     2.00 cm   LV E/e' lateral: 22.8 LV SV:         85 LV SV Index:   50 LVOT Area:     3.14 cm  RIGHT VENTRICLE             IVC RV Basal diam:  3.75 cm     IVC diam: 2.90 cm RV Mid diam:    2.70 cm RV S prime:     11.35 cm/s TAPSE (M-mode): 1.7 cm LEFT ATRIUM             Index        RIGHT ATRIUM           Index LA diam:        3.90 cm 2.28 cm/m   RA Area:     20.30 cm LA Vol (A2C):   89.9 ml 52.57 ml/m  RA Volume:   61.60 ml  36.02 ml/m LA Vol (A4C):   92.0 ml 53.80 ml/m LA Biplane Vol: 94.9 ml 55.49 ml/m  AORTIC VALVE                     PULMONIC VALVE AV Area (Vmax):    2.28 cm      PV Vmax:       0.96 m/s AV Area (Vmean):   2.34 cm      PV Peak grad:  3.7 mmHg AV Area (VTI):     2.30 cm AV Vmax:           166.33 cm/s AV Vmean:          111.667 cm/s AV VTI:            0.372  m AV Peak Grad:      11.1 mmHg AV Mean Grad:      6.0 mmHg LVOT Vmax:         120.75 cm/s LVOT Vmean:        83.350 cm/s LVOT VTI:          0.272 m LVOT/AV VTI ratio: 0.73  AORTA Ao Root diam: 2.70 cm Ao Asc diam:  3.20 cm MITRAL VALVE                TRICUSPID VALVE MV Area (PHT): 3.42 cm     TR Peak grad:   44.9 mmHg MV Decel Time: 222 msec     TR Vmax:        335.00 cm/s MV E velocity: 165.67 cm/s MV A velocity: 58.90 cm/s   SHUNTS MV E/A ratio:  2.81         Systemic VTI:  0.27 m                             Systemic Diam: 2.00 cm Dietrich Pates MD Electronically signed by Dietrich Pates MD Signature Date/Time: 07/09/2023/2:00:23 PM    Final    CT HEAD WO CONTRAST ( ) Result Date: 07/09/2023 CLINICAL DATA:  78 year old female recurrent code stroke presentation on 07/08/2023, left MCA M1 stent thrombus. Status post endovascular revascularization. Punctate left hemisphere infarcts on  post treatment MRI. EXAM: CT HEAD WITHOUT CONTRAST TECHNIQUE: Contiguous axial images were obtained from the base of the skull through the vertex without intravenous contrast. RADIATION DOSE REDUCTION: This exam was performed according to the departmental dose-optimization program which includes automated exposure control, adjustment of the mA and/or kV according to patient size and/or use of iterative reconstruction technique. COMPARISON:  Brain MRI yesterday and earlier. FINDINGS: Brain: No acute intracranial hemorrhage identified. No midline shift, mass effect, or evidence of intracranial mass lesion. No ventriculomegaly. Gray-white differentiation appears stable by CT since presentation yesterday with chronic ischemic changes in the left MCA territory, confluent chronic cerebral white matter disease. Subtle diffusion abnormality by MRI is not correlated. And no new cortically based infarct is identified. Vascular: Extensive Calcified atherosclerosis at the skull base. Left MCA stent redemonstrated. Skull: Stable, intact. Sinuses/Orbits: Left nasoenteric tube now in place. Visualized paranasal sinuses and mastoids are stable and well aerated. Other: No acute orbit or scalp soft tissue finding. IMPRESSION: No acute intracranial hemorrhage and subtle Left MCA territory ischemia on MRI is occult by CT. Stable chronic white matter and ischemic disease. No new intracranial abnormality. Electronically Signed   By: Odessa Fleming M.D.   On: 07/09/2023 06:28   DG Abd 1 View Result Date: 07/09/2023 CLINICAL DATA:  [id number].  Encounter for orogastric tube placement. EXAM: ABDOMEN - 1 VIEW COMPARISON:  Chest, abdomen and pelvis CT 06/29/2023 FINDINGS: There is a large hiatal hernia with about half of the stomach intrathoracic. NGT is in place and coiled within the herniated portion of the stomach. There is mild dilatation of some of the left abdominal small bowel segments up to 3.3 cm which could be due to ileus or partial small  bowel obstruction. Gas and stool are present in the colon at least to the proximal descending segment. There are cholecystectomy clips. No supine evidence of free air. Lung bases are clear. Cardiomegaly. IMPRESSION: 1. Large hiatal hernia with about half of the stomach intrathoracic. 2. NGT is in place and coiled within the herniated portion of the stomach.  3. Mild dilatation of some of the left abdominal small bowel segments up to 3.3 cm which could be due to ileus or partial small bowel obstruction. Electronically Signed   By: Almira Bar M.D.   On: 07/09/2023 06:12   MR BRAIN WO CONTRAST Result Date: 07/09/2023 CLINICAL DATA:  Stroke, follow up EXAM: MRI HEAD WITHOUT CONTRAST TECHNIQUE: Multiplanar, multiecho pulse sequences of the brain and surrounding structures were obtained without intravenous contrast. COMPARISON:  MRI head May 21 24. FINDINGS: Brain: Small acute infarct in the left insula and a few punctate acute infarcts in the left frontal lobe. No significant mass effect. Remote left basal ganglia infarcts. Patchy additional T2/FLAIR hyperintensities in the white matter, compatible with chronic microvascular ischemic change. No midline shift. No hydrocephalus. No mass lesion. Vascular: Major arterial flow voids are maintained at the skull base. Stented left MCA. Skull and upper cervical spine: Normal marrow signal. Sinuses/Orbits: Clear sinuses.  No acute orbital findings. Other: No mastoid effusions. IMPRESSION: Small acute infarct in the left insula and a few punctate acute infarcts in the left frontal lobe Electronically Signed   By: Feliberto Harts M.D.   On: 07/09/2023 01:53   CT ANGIO HEAD NECK W WO CM W PERF (CODE STROKE) Result Date: 07/08/2023 CLINICAL DATA:  Provided history: Neuro deficit, acute, stroke suspected. Slurred speech. Right-sided weakness. Confusion. Unable to follow commands. EXAM: CT ANGIOGRAPHY HEAD AND NECK CT PERFUSION BRAIN TECHNIQUE: Multidetector CT imaging of the  head and neck was performed using the standard protocol during bolus administration of intravenous contrast. Multiplanar CT image reconstructions and MIPs were obtained to evaluate the vascular anatomy. Carotid stenosis measurements (when applicable) are obtained utilizing NASCET criteria, using the distal internal carotid diameter as the denominator. Multiphase CT imaging of the brain was performed following IV bolus contrast injection. Subsequent parametric perfusion maps were calculated using RAPID software. RADIATION DOSE REDUCTION: This exam was performed according to the departmental dose-optimization program which includes automated exposure control, adjustment of the mA and/or kV according to patient size and/or use of iterative reconstruction technique. CONTRAST:  OMNIPAQUE IOHEXOL 350 MG/ML SOLN COMPARISON:  Non-contrast head CT performed earlier today 07/08/2023. CTA head/neck 09/19/2022. Brain MRI 09/23/2022. CT chest/abdomen/pelvis 06/29/2023. FINDINGS: CTA NECK FINDINGS Aortic arch: Standard aortic branching. Atherosclerotic plaque within the visualized thoracic aorta and proximal major branch vessels of the neck. No hemodynamically significant innominate or proximal subclavian artery stenosis. Right carotid system: CCA and ICA patent within the neck without hemodynamically significant stenosis (50% or greater). Atherosclerotic plaque at the CCA origin and about the carotid bifurcation. Left carotid system: CCA and ICA patent within the neck without hemodynamically significant stenosis (50% or greater). Atherosclerotic plaque at the CCA origin, scattered elsewhere within the CCA and about the carotid bifurcation. Tortuosity of the cervical ICA. Mild nonspecific fusiform dilation of the mid-to-distal cervical ICA, unchanged from the prior CTA of 09/19/2022. Vertebral arteries: The vertebral arteries are patent within the neck without stenosis or significant atherosclerotic disease. The left  vertebral artery is slightly dominant. Skeleton: Spondylosis of the cervical and visualized upper thoracic levels. The patient is edentulous. Other neck: No neck mass or cervical lymphadenopathy. Upper chest: 14 mm part-solid right upper lobe pulmonary nodule as was demonstrated on the recent prior CT chest/abdomen/pelvis of 09/19/2022 (series 7, image 391). Review of the MIP images confirms the above findings CTA HEAD FINDINGS Anterior circulation: The intracranial internal carotid arteries are patent. Atherosclerotic plaque within both vessels with no more than mild stenosis.  A vascular stent is present within the M1 and M2 left middle cerebral artery. There is a subocclusive filling defect within the stented portion of the distal M1 left middle cerebral artery consistent with thrombus (for instance as seen on series 9, image 166) (series 7, image 132). This results in a severe stenosis. No left M2 proximal branch occlusion is identified. Severe stenosis within an M3 left MCA vessel (series 13, image 29). The right middle cerebral artery M1 segment is patent. No right M2 proximal branch occlusion or high-grade proximal arterial stenosis. The anterior cerebral arteries are patent. 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 Posterior circulation: The intracranial vertebral arteries are patent. Mild atherosclerotic irregularity of the V4 right vertebral artery. The basilar artery is patent. The posterior cerebral arteries are patent. A left posterior communicating artery is present. The right posterior communicating artery is diminutive or absent. Venous sinuses: Within the limitations of contrast timing, no convincing thrombus. Anatomic variants: As described. Review of the MIP images confirms the above findings CT Brain Perfusion Findings: CBF (<30%) Volume: 8mL  Perfusion (Tmax>6.0s) volume: 56mL Mismatch Volume: 48mL Infarction Location:Left MCA vascular territory. CTA head impressions #1 and #2 These results were called by telephone at the time of interpretation on 07/08/2023 at 6:41 pm to provider [facility] , who verbally acknowledged these results. IMPRESSION: CTA neck: 1. The common carotid and internal carotid arteries are patent within the neck without hemodynamically significant stenosis. Atherosclerotic plaque bilaterally, as described. 2. Vertebral arteries patent within the neck without stenosis or significant atherosclerotic disease. 3. Aortic Atherosclerosis (ICD10-I70.0). 4. 14 mm part-solid right upper lobe pulmonary nodule as was demonstrated on the recent prior CT chest/abdomen/pelvis of 06/29/2023 (series 7, image 391). Consider one of the following: a follow-up non-contrast chest CT 3 months from the previous examination of 06/29/2023, a follow-up PET-CT or tissue sampling. This recommendation follows the consensus statement: Guidelines for Management of Incidental Pulmonary Nodules Detected on CT Images: From the Fleischner Society 2017; Radiology 2017; 284:228-243. CTA head: 1. Subocclusive filling defect (consistent with thrombus) within the stented portion of the left middle cerebral artery M1 segment. Resultant severe stenosis at this site. 2. Severe stenosis within a left middle cerebral artery M3 segment vessel. 3. Background intracranial sclerotic disease as described. 4. 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 CT perfusion head: The perfusion software identifies an 8 mL core infarct within the left MCA vascular territory. The perfusion software identifies a 56 mL region of critically hypoperfused parenchyma within the left MCA vascular territory (utilizing the Tmax>6 seconds threshold.). Reported mismatch volume: 48 mL. Electronically Signed   By: Jackey Loge D.O.   On: 07/08/2023 19:18   CT HEAD CODE STROKE WO CONTRAST Result Date: 07/08/2023 CLINICAL DATA:  Code stroke. Provided history: Slurred speech. Right-sided weakness. Confusion. Inability to follow commands. EXAM: CT HEAD WITHOUT CONTRAST TECHNIQUE: Contiguous axial images were obtained from the base of the skull through the vertex without intravenous contrast. RADIATION DOSE REDUCTION: This exam was performed according to the departmental dose-optimization program which includes automated exposure control, adjustment of the mA and/or kV according to patient size and/or use of iterative reconstruction technique. COMPARISON:  Brain MRI 09/23/2022. FINDINGS: Brain: Generalized cerebral and cerebellar atrophy. Chronic infarcts within the left basal ganglia. Small chronic cortical infarct  within the mid left frontal lobe (series 6, image 22). Background moderate patchy and ill-defined hypoattenuation within the cerebral white matter, nonspecific but compatible with chronic small vessel ischemic disease. There is no acute intracranial hemorrhage. No acute demarcated cortical infarct. No extra-axial fluid collection. No evidence of an intracranial mass. No midline shift. Vascular: Atherosclerotic calcifications. Vascular stent along the course of the left middle cerebral artery M1 and proximal M2 segments. No hyperdense vessel identified elsewhere. Skull: No calvarial fracture or aggressive osseous lesion. Sinuses/Orbits: No mass or acute finding within the imaged orbits. Minimal mucosal thickening within bilateral ethmoid air cells. ASPECTS [facility] Early CT Score) - Ganglionic level infarction (caudate, lentiform nuclei, internal capsule, insula, M1-M3 cortex): 7 - Supraganglionic infarction (M4-M6 cortex): 3 Total score (0-10 with 10 being normal): 10 (when discounting chronic infarcts). No evidence of an acute intracranial abnormality. These results were communicated to Dr. Amada Jupiter at  6:33 pmon 3/5/2025by text page via the [facility] messaging system. IMPRESSION: 1.  No evidence of an acute intracranial abnormality. 2. Parenchymal atrophy, chronic small vessel ischemic disease and chronic infarcts, as described. 3. Vascular stent along the course of the left middle cerebral artery M1 and M2 segments. Electronically Signed   By: Jackey Loge D.O.   On: 07/08/2023 18:37     PHYSICAL EXAM  Temp:  [98.1 F (36.7 C)-99 F (37.2 C)] 99 F (37.2 C) (03/08 0418) Pulse Rate:  [61-78] 71 (03/08 0418) Resp:  [18-22] 18 (03/08 0418) BP: (141-167)/(52-108) 163/83 (03/08 0418) SpO2:  [94 %-96 %] 95 % (03/08 0418)  General - Well nourished, well developed, in no apparent distress.  Ophthalmologic - fundi not visualized due to noncooperation.  Cardiovascular - irregularly irregular heart rate and rhythm.  Neuro - awake, alert, eyes open, orientated to age, place, time. No aphasia, paucity of language, following all simple commands. Able to name and repeat in mildly dysarthric voice. No gaze palsy, tracking bilaterally, visual field full, PERRL. No facial droop. Tongue midline. Bilateral UEs 4/5, no drift. Bilaterally LEs 4/5, no drift. Sensation symmetrical bilaterally, b/l FTN intact grossly, gait not tested.    ASSESSMENT/PLAN Ms. Melissa [name] is a 78 y.o. female with history of A-fib on Eliquis, hypertension, hyperlipidemia, diabetes, migraine, OSA not on CPAP, dementia, CHF, GBS, GI bleeding, stroke status post stenting on Brilinta admitted for right-sided weakness, slurred speech, right facial droop and aphasia. No TNK given due to on Eliquis.    Stroke:  left subinsular patchy and punctate left frontal infarcts, embolic likely secondary to A-fib not compliant with Eliquis CT no acute finding CT head and neck left M1 stent distal subocclusive occlusion.  Left M3 severe stenosis.  Bilateral ICA siphon atherosclerosis right more than left Status post IR with TICI3 MRI left  insular cortex patchy infarct, punctate left frontal infarct CT repeat no acute finding 2D Echo EF 65 to 70% LDL 68 HgbA1c 5.7 SCDs for VTE prophylaxis Brilinta (ticagrelor) 90 mg bid and Eliquis (apixaban) daily (but per pharmacy, no refill record for either of these medications) prior to admission, now on aspirin 81 mg daily.  Hold off Eliquis for today given severe anemia, will restart tomorrow if Hb stable Patient counseled to be compliant with her antithrombotic medications Ongoing aggressive stroke risk factor management Therapy recommendations: Pending Disposition: Pending  Severe anemia Right groin hemorrhage at incision site Patient denies any leg pain, abdominal pain Examination soft nontender abdomen Hemoglobin 8.5--6.4--2 x PRBC--7.6--7.6 -- 6.9 -1 u PRBCs Close CBC monitoring  Low threshold for CT abdomen to rule out retroperitoneal hemorrhage Hold off Eliquis again for today, continue aspirin for now and will restart eliquis tomorrow if Hb stable  History of stroke 09/2022 left MCA infarct with left M1 occlusion, status post IR with TICI3 and left MCA stenting.  EF 60 to 65%.  LDL 120, A1c 5.5.  Discharged on Brilinta and Eliquis and Crestor 40.  A-fib Rate controlled Home Eliquis but noncompliant per pharmacy record Now Eliquis on hold due to anemia Will consider Eliquis once hemoglobin stabilized  Diabetes HgbA1c 5.7 goal < 7.0 Controlled CBG monitoring SSI DM education and close PCP follow up  Hypertension Stable on the high end Resume home losartan 50 BP goal less than 180/105 now Long-term BP goal normotensive  Hyperlipidemia Home meds: Crestor 40 LDL 68, goal < 70 Now on Crestor 40 Continue statin at discharge  Dysphagia Did not pass swallow Has NG tube On tube feeding  Other Stroke Risk Factors Advanced age Migraines Obstructive sleep apnea  Other Active Problems History of GBS Dementia History of GI bleeding  Hospital day #  3  Patient seen and examined by NP/APP with MD. MD to update note as needed.   Elmer Picker, DNP, FNP-BC [facility] Pager: [phone]  I have personally obtained history,examined this patient, reviewed notes, independently viewed imaging studies, participated in medical decision making and plan of care.ROS completed by me personally and pertinent positives fully documented  I have made any additions or clarifications directly to the above note. Agree with note above.  Patient is neurologically improved but hemoglobin is declining hence will transfuse PRBC today.  Groin site appears stable.  Repeat hematocrit in the morning and if stable may consider anticoagulation.  No family at the bedside.  Greater than 50% time during this 35-minute visit was spent in counseling and coordination of care about the stroke and groin bleed and anemia and answering questions  Delia Heady, MD Medical Director [facility] Pager: [phone] 07/11/2023 2:00 PM    To contact Stroke Continuity provider, please refer to [email or web address]. After hours, contact General Neurology

## 2023-07-12 DIAGNOSIS — I63512 Cerebral infarction due to unspecified occlusion or stenosis of left middle cerebral artery: Secondary | ICD-10-CM | POA: Diagnosis not present

## 2023-07-12 LAB — BASIC METABOLIC PANEL
Anion gap: 7 (ref 5–15)
BUN: 10 mg/dL (ref 8–23)
CO2: 25 mmol/L (ref 22–32)
Calcium: 8.9 mg/dL (ref 8.9–10.3)
Chloride: 103 mmol/L (ref 98–111)
Creatinine, Ser: 0.88 mg/dL (ref 0.44–1.00)
GFR, Estimated: >60 mL/min (ref >60)
Glucose, Bld: 117 mg/dL — ABNORMAL HIGH (ref 70–99)
Potassium: 4.2 mmol/L (ref 3.5–5.1)
Sodium: 135 mmol/L (ref 135–145)

## 2023-07-12 LAB — BPAM RBC
Blood Product Expiration Date: 202503302359
Blood Product Expiration Date: 202503302359
Blood Product Expiration Date: 202503312359
ISSUE DATE / TIME: 202503060709
ISSUE DATE / TIME: 202503060948
ISSUE DATE / TIME: 202503080905
Unit Type and Rh: 6200
Unit Type and Rh: 6200
Unit Type and Rh: 6200

## 2023-07-12 LAB — TYPE AND SCREEN
ABO/RH(D): A POS
Antibody Screen: NEGATIVE
Unit division: 0
Unit division: 0
Unit division: 0

## 2023-07-12 LAB — CBC
HCT: 27.5 % — ABNORMAL LOW (ref 36.0–46.0)
Hemoglobin: 8.6 g/dL — ABNORMAL LOW (ref 12.0–15.0)
MCH: 24.1 pg — ABNORMAL LOW (ref 26.0–34.0)
MCHC: 31.3 g/dL (ref 30.0–36.0)
MCV: 77 fL — ABNORMAL LOW (ref 80.0–100.0)
Platelets: 344 10*3/uL (ref 150–400)
RBC: 3.57 MIL/uL — ABNORMAL LOW (ref 3.87–5.11)
RDW: 18.6 % — ABNORMAL HIGH (ref 11.5–15.5)
WBC: 5.5 10*3/uL (ref 4.0–10.5)
nRBC: 0 % (ref 0.0–0.2)

## 2023-07-12 LAB — GLUCOSE, CAPILLARY
Glucose-Capillary: 96 mg/dL (ref 70–99)
Glucose-Capillary: 106 mg/dL — ABNORMAL HIGH (ref 70–99)
Glucose-Capillary: 101 mg/dL — ABNORMAL HIGH (ref 70–99)

## 2023-07-12 NOTE — Plan of Care (Signed)
   Problem: Education: Goal: Knowledge of disease or condition will improve Outcome: Progressing

## 2023-07-12 NOTE — Plan of Care (Signed)
  Problem: Education: Goal: Knowledge of secondary prevention will improve (MUST DOCUMENT ALL) Outcome: Progressing Goal: Knowledge of patient specific risk factors will improve (DELETE if not current risk factor) Outcome: Progressing   Problem: Ischemic Stroke/TIA Tissue Perfusion: Goal: Complications of ischemic stroke/TIA will be minimized Outcome: Progressing   Problem: Coping: Goal: Will verbalize positive feelings about self Outcome: Progressing

## 2023-07-12 NOTE — Progress Notes (Signed)
 STROKE TEAM PROGRESS NOTE   SUBJECTIVE (INTERVAL HISTORY) No family is at the bedside.  Pt lying in bed, AAO x 3 today, much awake alert than yesterday. Right hemiparesis much improved.   vital signs stable.No complaints.Transfused 1u PRBC y`day but f/u Hb not back yet   OBJECTIVE Temp:  [97.3 F (36.3 C)-98.8 F (37.1 C)] 97.3 F (36.3 C) (03/09 1109) Pulse Rate:  [55-73] 56 (03/09 1109) Cardiac Rhythm: Atrial fibrillation (03/09 0700) Resp:  [15-18] 17 (03/09 1109) BP: (157-167)/(63-83) 158/63 (03/09 1109) SpO2:  [95 %-100 %] 100 % (03/09 1109)  Recent Labs  Lab 07/11/23 1647 07/11/23 2106 07/11/23 2308 07/12/23 0607 07/12/23 1110  GLUCAP 103* 103* 99 96 106*   Recent Labs  Lab 07/08/23 1814 07/09/23 0124 07/09/23 1213 07/10/23 0528 07/11/23 0624  NA 138 137  --  136 135  K 3.7 3.8  --  3.5 4.3  CL 107 105  --  107 106  CO2  --  22  --  23 23  GLUCOSE 100* 110*  --  122* 97  BUN 4* <5*  --  9 13  CREATININE 1.00 0.83  --  0.89 0.97  CALCIUM  --  8.6*  --  8.3* 8.3*  MG  --   --  1.6* 1.7  --   PHOS  --   --  3.1 3.5  --    Recent Labs  Lab 07/09/23 0124  AST 19  ALT 12  ALKPHOS 61  BILITOT 1.0  PROT 5.8*  ALBUMIN 3.1*   Recent Labs  Lab 07/09/23 0124 07/09/23 0227 07/09/23 1552 07/10/23 0528 07/11/23 0624  WBC 7.0  --   --  6.7 6.3  NEUTROABS 5.8  --   --   --   --   HGB 6.4* 6.4* 7.6* 7.6* 6.9*  HCT 21.7* 21.6* 24.5* 24.4* 22.5*  MCV 72.6*  --   --  73.9* 74.8*  PLT 368  --   --  328 297   No results for input(s): "CKTOTAL", "CKMB", "CKMBINDEX", "TROPONINI" in the last 168 hours. No results for input(s): "LABPROT", "INR" in the last 72 hours.  No results for input(s): "COLORURINE", "LABSPEC", "PHURINE", "GLUCOSEU", "HGBUR", "BILIRUBINUR", "KETONESUR", "PROTEINUR", "UROBILINOGEN", "NITRITE", "LEUKOCYTESUR" in the last 72 hours.  Invalid input(s): "APPERANCEUR"     Component Value Date/Time   CHOL 128 07/09/2023 0124   TRIG 59 07/09/2023  0124   HDL 48 07/09/2023 0124   CHOLHDL 2.7 07/09/2023 0124   VLDL 12 07/09/2023 0124   LDLCALC 68 07/09/2023 0124   Lab Results  Component Value Date   HGBA1C 5.7 (H) 07/09/2023   No results found for: "LABOPIA", "COCAINSCRNUR", "LABBENZ", "AMPHETMU", "THCU", "LABBARB"  Recent Labs  Lab 07/09/23 0124  ETH <10    I have personally reviewed the radiological images below and agree with the radiology interpretations.  IR PERCUTANEOUS ART THROMBECTOMY/INFUSION INTRACRANIAL INC DIAG ANGIO Result Date: 07/10/2023 INDICATION: New onset aphasia and right-sided weakness. 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 EXAM: 1. EMERGENT LARGE VESSEL OCCLUSION THROMBOLYSIS (anterior CIRCULATION) COMPARISON:  CT angiogram of the head and neck July 08, 2023. MEDICATIONS: No antibiotic was administered within 1 hour of the procedure. ANESTHESIA/SEDATION: General anesthesia. CONTRAST:  Omnipaque 300 approximately 75 cc. FLUOROSCOPY TIME:  Fluoroscopy Time: 9 minutes 54 seconds (812 mGy). COMPLICATIONS: None immediate. TECHNIQUE: Following a full explanation of the procedure along with the potential associated complications, an informed witnessed consent  was obtained. The risks of intracranial hemorrhage of 10%, worsening neurological deficit, ventilator dependency, death and inability to revascularize were all reviewed in detail with the patient's son. The patient was then put under general anesthesia by the Department of Anesthesiology at [facility]. The right groin was prepped and draped in the usual sterile fashion. Thereafter using modified Seldinger technique, transfemoral access into the right common femoral artery was obtained without difficulty. Over an 0.035 inch guidewire an 8 French 25 cm Pinnacle sheath was inserted. Through this, and also over an 0.035 inch guidewire a combination of an 088 100 cm Zoom support catheter with a 125 cm  6 French Simmons 2 support catheter was advanced to the aortic arch region, and selectively positioned in the left common carotid artery, and the distal left internal carotid artery. FINDINGS: The left common carotid arteriogram demonstrates the left external carotid artery and its major branches to be widely patent. The left internal carotid to the skull base is widely patent with a moderate to severe tortuosity of the mid cervical ICA. The petrous, the cavernous and the supraclinoid left ICA are widely patent with mild stenosis in the caval segment. A left posterior communicating artery is seen opacifying the left posterior cerebral artery distribution. The left anterior cerebral artery opacifies into the capillary and venous phases. The left middle cerebral artery demonstrates a stable moderate stenosis in the mid M1 segment with near complete occlusion distally at the level of the trifurcation branches. PROCEDURE: Through the 088 support catheter in the distal cervical ICA, a combination of an 062 Penumbra aspiration catheter with an 043 distal aspiration catheter was advanced over an 035 inch colossal guidewire to the supraclinoid left ICA. The wire was then advanced through the occluded stented segment of the middle cerebral artery into the distal stented segment in the superior division followed by advancement of the 043 and the 062 aspiration catheters into the occluded stent. The wire was removed. Aspiration was then applied for about 30 seconds at the hub of the 043 aspiration catheter, with a Penumbra aspiration pump at the hub of the 062 aspiration catheter for approximately 2 minutes. Following retrieval of the two aspiration catheters, a control arteriogram performed through the 088 support catheter demonstrated complete revascularization of the left middle cerebral artery distribution achieving a TICI 3 revascularization. Also noted was patency of the stented segment and also of the proximal left  middle cerebral artery M1 segment significantly improved compared to the pre treatment caliber. Control arteriogram was then performed at 10, 20 and 35 minutes post revascularization. These continued to demonstrate improved caliber of the left middle cerebral artery distribution. The patient was given a total of 4.5 mg of intra arterial Integrilin in order to prevent platelet aggregation within the stent. A final control arteriogram performed through the 088 support catheter in the left common carotid artery demonstrates the left internal carotid artery to be widely patent to the cranial skull base with no change in the tortuosity. Intracranially, the left middle cerebral artery and the left anterior cerebral artery distributions continued to demonstrate wide patency and TICI 3 revascularization. The 088 support catheter was removed. An 8 French Angio-Seal closure device was used for hemostasis at the right groin puncture site. Distal pulses remained present bilaterally unchanged from prior to the procedure. A flat panel CT of the brain demonstrated no evidence of hemorrhagic complications. The patient was extubated. Upon recovery, the patient followed simple commands intermittently appropriately. Movement was noted in the right  upper and right lower extremity. Patient was then transferred to the neuro ICU for post revascularization care. IMPRESSION: Status post complete revascularization of near complete occlusion of the left middle cerebral artery stented segment with 1 pass with a combination of 062 and 043 aspiration catheters achieving a TICI 3 revascularization. PLAN: As per referring MD. Electronically Signed   By: Julieanne Cotton M.D.   On: 07/10/2023 08:50   IR CT Head Ltd Result Date: 07/10/2023 INDICATION: New onset aphasia and right-sided weakness. 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 EXAM: 1. EMERGENT LARGE VESSEL OCCLUSION  THROMBOLYSIS (anterior CIRCULATION) COMPARISON:  CT angiogram of the head and neck July 08, 2023. MEDICATIONS: No antibiotic was administered within 1 hour of the procedure. ANESTHESIA/SEDATION: General anesthesia. CONTRAST:  Omnipaque 300 approximately 75 cc. FLUOROSCOPY TIME:  Fluoroscopy Time: 9 minutes 54 seconds (812 mGy). COMPLICATIONS: None immediate. TECHNIQUE: Following a full explanation of the procedure along with the potential associated complications, an informed witnessed consent was obtained. The risks of intracranial hemorrhage of 10%, worsening neurological deficit, ventilator dependency, death and inability to revascularize were all reviewed in detail with the patient's son. The patient was then put under general anesthesia by the Department of Anesthesiology at [facility]. The right groin was prepped and draped in the usual sterile fashion. Thereafter using modified Seldinger technique, transfemoral access into the right common femoral artery was obtained without difficulty. Over an 0.035 inch guidewire an 8 French 25 cm Pinnacle sheath was inserted. Through this, and also over an 0.035 inch guidewire a combination of an 088 100 cm Zoom support catheter with a 125 cm 6 French Simmons 2 support catheter was advanced to the aortic arch region, and selectively positioned in the left common carotid artery, and the distal left internal carotid artery. FINDINGS: The left common carotid arteriogram demonstrates the left external carotid artery and its major branches to be widely patent. The left internal carotid to the skull base is widely patent with a moderate to severe tortuosity of the mid cervical ICA. The petrous, the cavernous and the supraclinoid left ICA are widely patent with mild stenosis in the caval segment. A left posterior communicating artery is seen opacifying the left posterior cerebral artery distribution. The left anterior cerebral artery opacifies into the capillary and  venous phases. The left middle cerebral artery demonstrates a stable moderate stenosis in the mid M1 segment with near complete occlusion distally at the level of the trifurcation branches. PROCEDURE: Through the 088 support catheter in the distal cervical ICA, a combination of an 062 Penumbra aspiration catheter with an 043 distal aspiration catheter was advanced over an 035 inch colossal guidewire to the supraclinoid left ICA. The wire was then advanced through the occluded stented segment of the middle cerebral artery into the distal stented segment in the superior division followed by advancement of the 043 and the 062 aspiration catheters into the occluded stent. The wire was removed. Aspiration was then applied for about 30 seconds at the hub of the 043 aspiration catheter, with a Penumbra aspiration pump at the hub of the 062 aspiration catheter for approximately 2 minutes. Following retrieval of the two aspiration catheters, a control arteriogram performed through the 088 support catheter demonstrated complete revascularization of the left middle cerebral artery distribution achieving a TICI 3 revascularization. Also noted was patency of the stented segment and also of the proximal left middle cerebral artery M1 segment significantly improved compared to  the pre treatment caliber. Control arteriogram was then performed at 10, 20 and 35 minutes post revascularization. These continued to demonstrate improved caliber of the left middle cerebral artery distribution. The patient was given a total of 4.5 mg of intra arterial Integrilin in order to prevent platelet aggregation within the stent. A final control arteriogram performed through the 088 support catheter in the left common carotid artery demonstrates the left internal carotid artery to be widely patent to the cranial skull base with no change in the tortuosity. Intracranially, the left middle cerebral artery and the left anterior cerebral artery  distributions continued to demonstrate wide patency and TICI 3 revascularization. The 088 support catheter was removed. An 8 French Angio-Seal closure device was used for hemostasis at the right groin puncture site. Distal pulses remained present bilaterally unchanged from prior to the procedure. A flat panel CT of the brain demonstrated no evidence of hemorrhagic complications. The patient was extubated. Upon recovery, the patient followed simple commands intermittently appropriately. Movement was noted in the right upper and right lower extremity. Patient was then transferred to the neuro ICU for post revascularization care. IMPRESSION: Status post complete revascularization of near complete occlusion of the left middle cerebral artery stented segment with 1 pass with a combination of 062 and 043 aspiration catheters achieving a TICI 3 revascularization. PLAN: As per referring MD. Electronically Signed   By: Julieanne Cotton M.D.   On: 07/10/2023 08:50   ECHOCARDIOGRAM COMPLETE Result Date: 07/09/2023    ECHOCARDIOGRAM REPORT   Patient Name:   Melissa [name] Date of Exam: 07/09/2023 Medical Rec #:  [id number]        Height:       62.0 in Accession #:    [id number]       Weight:       153.9 lb Date of Birth:  24-Aug-1945        BSA:          1.710 m Patient Age:    77 years         BP:           112/88 mmHg Patient Gender: F                HR:           57 bpm. Exam Location:  Inpatient Procedure: 2D Echo, Cardiac Doppler and Color Doppler (Both Spectral and Color            Flow Doppler were utilized during procedure). Indications:    Stroke I63.9  History:        Patient has prior history of Echocardiogram examinations, most                 recent 06/29/2023. CHF, CAD, Stroke, CKD 3 and COPD; Risk                 Factors:Dyslipidemia and Non-Smoker.  Sonographer:    Dondra Prader RVT RCS Referring Phys: [id number] Lynnae January IMPRESSIONS  1. Left ventricular ejection fraction, by estimation, is 65 to 70%. The left  ventricle has normal function. The left ventricle has no regional wall motion abnormalities. Left ventricular diastolic parameters were normal.  2. Right ventricular systolic function is normal. The right ventricular size is normal.  3. Left atrial size was severely dilated.  4. The mitral valve is normal in structure. Mild mitral valve regurgitation.  5. The aortic valve is tricuspid. Aortic valve regurgitation is not visualized. Aortic valve sclerosis/calcification is  present, without any evidence of aortic stenosis. Comparison(s): The left ventricular function is unchanged. FINDINGS  Left Ventricle: Left ventricular ejection fraction, by estimation, is 65 to 70%. The left ventricle has normal function. The left ventricle has no regional wall motion abnormalities. The left ventricular internal cavity size was normal in size. There is  no left ventricular hypertrophy. Left ventricular diastolic parameters were normal. Right Ventricle: The right ventricular size is normal. Right vetricular wall thickness was not assessed. Right ventricular systolic function is normal. Left Atrium: Left atrial size was severely dilated. Right Atrium: Right atrial size was normal in size. Pericardium: Trivial pericardial effusion is present. Mitral Valve: The mitral valve is normal in structure. Mild mitral valve regurgitation. Tricuspid Valve: The tricuspid valve is normal in structure. Tricuspid valve regurgitation is mild. Aortic Valve: The aortic valve is tricuspid. Aortic valve regurgitation is not visualized. Aortic valve sclerosis/calcification is present, without any evidence of aortic stenosis. Aortic valve mean gradient measures 6.0 mmHg. Aortic valve peak gradient measures 11.1 mmHg. Aortic valve area, by VTI measures 2.30 cm. Pulmonic Valve: The pulmonic valve was normal in structure. Pulmonic valve regurgitation is not visualized. Aorta: The aortic root and ascending aorta are structurally normal, with no evidence of  dilitation. IAS/Shunts: No atrial level shunt detected by color flow Doppler.  LEFT VENTRICLE PLAX 2D LVIDd:         4.40 cm   Diastology LVIDs:         2.80 cm   LV e' medial:    8.11 cm/s LV PW:         1.20 cm   LV E/e' medial:  20.4 LV IVS:        0.90 cm   LV e' lateral:   7.26 cm/s LVOT diam:     2.00 cm   LV E/e' lateral: 22.8 LV SV:         85 LV SV Index:   50 LVOT Area:     3.14 cm  RIGHT VENTRICLE             IVC RV Basal diam:  3.75 cm     IVC diam: 2.90 cm RV Mid diam:    2.70 cm RV S prime:     11.35 cm/s TAPSE (M-mode): 1.7 cm LEFT ATRIUM             Index        RIGHT ATRIUM           Index LA diam:        3.90 cm 2.28 cm/m   RA Area:     20.30 cm LA Vol (A2C):   89.9 ml 52.57 ml/m  RA Volume:   61.60 ml  36.02 ml/m LA Vol (A4C):   92.0 ml 53.80 ml/m LA Biplane Vol: 94.9 ml 55.49 ml/m  AORTIC VALVE                     PULMONIC VALVE AV Area (Vmax):    2.28 cm      PV Vmax:       0.96 m/s AV Area (Vmean):   2.34 cm      PV Peak grad:  3.7 mmHg AV Area (VTI):     2.30 cm AV Vmax:           166.33 cm/s AV Vmean:          111.667 cm/s AV VTI:  0.372 m AV Peak Grad:      11.1 mmHg AV Mean Grad:      6.0 mmHg LVOT Vmax:         120.75 cm/s LVOT Vmean:        83.350 cm/s LVOT VTI:          0.272 m LVOT/AV VTI ratio: 0.73  AORTA Ao Root diam: 2.70 cm Ao Asc diam:  3.20 cm MITRAL VALVE                TRICUSPID VALVE MV Area (PHT): 3.42 cm     TR Peak grad:   44.9 mmHg MV Decel Time: 222 msec     TR Vmax:        335.00 cm/s MV E velocity: 165.67 cm/s MV A velocity: 58.90 cm/s   SHUNTS MV E/A ratio:  2.81         Systemic VTI:  0.27 m                             Systemic Diam: 2.00 cm Dietrich Pates MD Electronically signed by Dietrich Pates MD Signature Date/Time: 07/09/2023/2:00:23 PM    Final    CT HEAD WO CONTRAST ( ) Result Date: 07/09/2023 CLINICAL DATA:  78 year old female recurrent code stroke presentation on 07/08/2023, left MCA M1 stent thrombus. Status post endovascular  revascularization. Punctate left hemisphere infarcts on post treatment MRI. EXAM: CT HEAD WITHOUT CONTRAST TECHNIQUE: Contiguous axial images were obtained from the base of the skull through the vertex without intravenous contrast. RADIATION DOSE REDUCTION: This exam was performed according to the departmental dose-optimization program which includes automated exposure control, adjustment of the mA and/or kV according to patient size and/or use of iterative reconstruction technique. COMPARISON:  Brain MRI yesterday and earlier. FINDINGS: Brain: No acute intracranial hemorrhage identified. No midline shift, mass effect, or evidence of intracranial mass lesion. No ventriculomegaly. Gray-white differentiation appears stable by CT since presentation yesterday with chronic ischemic changes in the left MCA territory, confluent chronic cerebral white matter disease. Subtle diffusion abnormality by MRI is not correlated. And no new cortically based infarct is identified. Vascular: Extensive Calcified atherosclerosis at the skull base. Left MCA stent redemonstrated. Skull: Stable, intact. Sinuses/Orbits: Left nasoenteric tube now in place. Visualized paranasal sinuses and mastoids are stable and well aerated. Other: No acute orbit or scalp soft tissue finding. IMPRESSION: No acute intracranial hemorrhage and subtle Left MCA territory ischemia on MRI is occult by CT. Stable chronic white matter and ischemic disease. No new intracranial abnormality. Electronically Signed   By: Odessa Fleming M.D.   On: 07/09/2023 06:28   DG Abd 1 View Result Date: 07/09/2023 CLINICAL DATA:  [id number].  Encounter for orogastric tube placement. EXAM: ABDOMEN - 1 VIEW COMPARISON:  Chest, abdomen and pelvis CT 06/29/2023 FINDINGS: There is a large hiatal hernia with about half of the stomach intrathoracic. NGT is in place and coiled within the herniated portion of the stomach. There is mild dilatation of some of the left abdominal small bowel segments up  to 3.3 cm which could be due to ileus or partial small bowel obstruction. Gas and stool are present in the colon at least to the proximal descending segment. There are cholecystectomy clips. No supine evidence of free air. Lung bases are clear. Cardiomegaly. IMPRESSION: 1. Large hiatal hernia with about half of the stomach intrathoracic. 2. NGT is in place and coiled within the herniated portion of the  stomach. 3. Mild dilatation of some of the left abdominal small bowel segments up to 3.3 cm which could be due to ileus or partial small bowel obstruction. Electronically Signed   By: Almira Bar M.D.   On: 07/09/2023 06:12   MR BRAIN WO CONTRAST Result Date: 07/09/2023 CLINICAL DATA:  Stroke, follow up EXAM: MRI HEAD WITHOUT CONTRAST TECHNIQUE: Multiplanar, multiecho pulse sequences of the brain and surrounding structures were obtained without intravenous contrast. COMPARISON:  MRI head May 21 24. FINDINGS: Brain: Small acute infarct in the left insula and a few punctate acute infarcts in the left frontal lobe. No significant mass effect. Remote left basal ganglia infarcts. Patchy additional T2/FLAIR hyperintensities in the white matter, compatible with chronic microvascular ischemic change. No midline shift. No hydrocephalus. No mass lesion. Vascular: Major arterial flow voids are maintained at the skull base. Stented left MCA. Skull and upper cervical spine: Normal marrow signal. Sinuses/Orbits: Clear sinuses.  No acute orbital findings. Other: No mastoid effusions. IMPRESSION: Small acute infarct in the left insula and a few punctate acute infarcts in the left frontal lobe Electronically Signed   By: Feliberto Harts M.D.   On: 07/09/2023 01:53   CT ANGIO HEAD NECK W WO CM W PERF (CODE STROKE) Result Date: 07/08/2023 CLINICAL DATA:  Provided history: Neuro deficit, acute, stroke suspected. Slurred speech. Right-sided weakness. Confusion. Unable to follow commands. EXAM: CT ANGIOGRAPHY HEAD AND NECK CT  PERFUSION BRAIN TECHNIQUE: Multidetector CT imaging of the head and neck was performed using the standard protocol during bolus administration of intravenous contrast. Multiplanar CT image reconstructions and MIPs were obtained to evaluate the vascular anatomy. Carotid stenosis measurements (when applicable) are obtained utilizing NASCET criteria, using the distal internal carotid diameter as the denominator. Multiphase CT imaging of the brain was performed following IV bolus contrast injection. Subsequent parametric perfusion maps were calculated using RAPID software. RADIATION DOSE REDUCTION: This exam was performed according to the departmental dose-optimization program which includes automated exposure control, adjustment of the mA and/or kV according to patient size and/or use of iterative reconstruction technique. CONTRAST:  OMNIPAQUE IOHEXOL 350 MG/ML SOLN COMPARISON:  Non-contrast head CT performed earlier today 07/08/2023. CTA head/neck 09/19/2022. Brain MRI 09/23/2022. CT chest/abdomen/pelvis 06/29/2023. FINDINGS: CTA NECK FINDINGS Aortic arch: Standard aortic branching. Atherosclerotic plaque within the visualized thoracic aorta and proximal major branch vessels of the neck. No hemodynamically significant innominate or proximal subclavian artery stenosis. Right carotid system: CCA and ICA patent within the neck without hemodynamically significant stenosis (50% or greater). Atherosclerotic plaque at the CCA origin and about the carotid bifurcation. Left carotid system: CCA and ICA patent within the neck without hemodynamically significant stenosis (50% or greater). Atherosclerotic plaque at the CCA origin, scattered elsewhere within the CCA and about the carotid bifurcation. Tortuosity of the cervical ICA. Mild nonspecific fusiform dilation of the mid-to-distal cervical ICA, unchanged from the prior CTA of 09/19/2022. Vertebral arteries: The vertebral arteries are patent within the neck without  stenosis or significant atherosclerotic disease. The left vertebral artery is slightly dominant. Skeleton: Spondylosis of the cervical and visualized upper thoracic levels. The patient is edentulous. Other neck: No neck mass or cervical lymphadenopathy. Upper chest: 14 mm part-solid right upper lobe pulmonary nodule as was demonstrated on the recent prior CT chest/abdomen/pelvis of 09/19/2022 (series 7, image 391). Review of the MIP images confirms the above findings CTA HEAD FINDINGS Anterior circulation: The intracranial internal carotid arteries are patent. Atherosclerotic plaque within both vessels with no more than mild  stenosis. A vascular stent is present within the M1 and M2 left middle cerebral artery. There is a subocclusive filling defect within the stented portion of the distal M1 left middle cerebral artery consistent with thrombus (for instance as seen on series 9, image 166) (series 7, image 132). This results in a severe stenosis. No left M2 proximal branch occlusion is identified. Severe stenosis within an M3 left MCA vessel (series 13, image 29). The right middle cerebral artery M1 segment is patent. No right M2 proximal branch occlusion or high-grade proximal arterial stenosis. The anterior cerebral arteries are patent. 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 Posterior circulation: The intracranial vertebral arteries are patent. Mild atherosclerotic irregularity of the V4 right vertebral artery. The basilar artery is patent. The posterior cerebral arteries are patent. A left posterior communicating artery is present. The right posterior communicating artery is diminutive or absent. Venous sinuses: Within the limitations of contrast timing, no convincing thrombus. Anatomic variants: As described. Review of the MIP images confirms the above  findings CT Brain Perfusion Findings: CBF (<30%) Volume: 8mL Perfusion (Tmax>6.0s) volume: 56mL Mismatch Volume: 48mL Infarction Location:Left MCA vascular territory. CTA head impressions #1 and #2 These results were called by telephone at the time of interpretation on 07/08/2023 at 6:41 pm to provider [facility] , who verbally acknowledged these results. IMPRESSION: CTA neck: 1. The common carotid and internal carotid arteries are patent within the neck without hemodynamically significant stenosis. Atherosclerotic plaque bilaterally, as described. 2. Vertebral arteries patent within the neck without stenosis or significant atherosclerotic disease. 3. Aortic Atherosclerosis (ICD10-I70.0). 4. 14 mm part-solid right upper lobe pulmonary nodule as was demonstrated on the recent prior CT chest/abdomen/pelvis of 06/29/2023 (series 7, image 391). Consider one of the following: a follow-up non-contrast chest CT 3 months from the previous examination of 06/29/2023, a follow-up PET-CT or tissue sampling. This recommendation follows the consensus statement: Guidelines for Management of Incidental Pulmonary Nodules Detected on CT Images: From the Fleischner Society 2017; Radiology 2017; 284:228-243. CTA head: 1. Subocclusive filling defect (consistent with thrombus) within the stented portion of the left middle cerebral artery M1 segment. Resultant severe stenosis at this site. 2. Severe stenosis within a left middle cerebral artery M3 segment vessel. 3. Background intracranial sclerotic disease as described. 4. 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 CT perfusion head: The perfusion software identifies an 8 mL core infarct within the left MCA vascular territory. The perfusion software identifies a 56 mL region of critically hypoperfused parenchyma within the left MCA vascular territory (utilizing the Tmax>6 seconds threshold.).  Reported mismatch volume: 48 mL. Electronically Signed   By: Jackey Loge D.O.   On: 07/08/2023 19:18   CT HEAD CODE STROKE WO CONTRAST Result Date: 07/08/2023 CLINICAL DATA:  Code stroke. Provided history: Slurred speech. Right-sided weakness. Confusion. Inability to follow commands. EXAM: CT HEAD WITHOUT CONTRAST TECHNIQUE: Contiguous axial images were obtained from the base of the skull through the vertex without intravenous contrast. RADIATION DOSE REDUCTION: This exam was performed according to the departmental dose-optimization program which includes automated exposure control, adjustment of the mA and/or kV according to patient size and/or use of iterative reconstruction technique. COMPARISON:  Brain MRI 09/23/2022. FINDINGS: Brain: Generalized cerebral and cerebellar atrophy. Chronic infarcts within the left basal ganglia. Small chronic cortical  infarct within the mid left frontal lobe (series 6, image 22). Background moderate patchy and ill-defined hypoattenuation within the cerebral white matter, nonspecific but compatible with chronic small vessel ischemic disease. There is no acute intracranial hemorrhage. No acute demarcated cortical infarct. No extra-axial fluid collection. No evidence of an intracranial mass. No midline shift. Vascular: Atherosclerotic calcifications. Vascular stent along the course of the left middle cerebral artery M1 and proximal M2 segments. No hyperdense vessel identified elsewhere. Skull: No calvarial fracture or aggressive osseous lesion. Sinuses/Orbits: No mass or acute finding within the imaged orbits. Minimal mucosal thickening within bilateral ethmoid air cells. ASPECTS [facility] Stroke Program Early CT Score) - Ganglionic level infarction (caudate, lentiform nuclei, internal capsule, insula, M1-M3 cortex): 7 - Supraganglionic infarction (M4-M6 cortex): 3 Total score (0-10 with 10 being normal): 10 (when discounting chronic infarcts). No evidence of an acute intracranial  abnormality. These results were communicated to Dr. Amada Jupiter at 6:33 pmon 3/5/2025by text page via the [facility] messaging system. IMPRESSION: 1.  No evidence of an acute intracranial abnormality. 2. Parenchymal atrophy, chronic small vessel ischemic disease and chronic infarcts, as described. 3. Vascular stent along the course of the left middle cerebral artery M1 and M2 segments. Electronically Signed   By: Jackey Loge D.O.   On: 07/08/2023 18:37     PHYSICAL EXAM  Temp:  [97.3 F (36.3 C)-98.8 F (37.1 C)] 97.3 F (36.3 C) (03/09 1109) Pulse Rate:  [55-73] 56 (03/09 1109) Resp:  [15-18] 17 (03/09 1109) BP: (157-167)/(63-83) 158/63 (03/09 1109) SpO2:  [95 %-100 %] 100 % (03/09 1109)  General - Well nourished, well developed, in no apparent distress.  Ophthalmologic - fundi not visualized due to noncooperation.  Cardiovascular - irregularly irregular heart rate and rhythm.  Neuro - awake, alert, eyes open, orientated to age, place, time. No aphasia, paucity of language, following all simple commands. Able to name and repeat in mildly dysarthric voice. No gaze palsy, tracking bilaterally, visual field full, PERRL. No facial droop. Tongue midline. Bilateral UEs 4/5, no drift. Bilaterally LEs 4/5, no drift. Sensation symmetrical bilaterally, b/l FTN intact grossly, gait not tested.    ASSESSMENT/PLAN Ms. Melissa [name] is a 78 y.o. female with history of A-fib on Eliquis, hypertension, hyperlipidemia, diabetes, migraine, OSA not on CPAP, dementia, CHF, GBS, GI bleeding, stroke status post stenting on Brilinta admitted for right-sided weakness, slurred speech, right facial droop and aphasia. No TNK given due to on Eliquis.    Stroke:  left subinsular patchy and punctate left frontal infarcts, embolic likely secondary to A-fib not compliant with Eliquis CT no acute finding CT head and neck left M1 stent distal subocclusive occlusion.  Left M3 severe stenosis.  Bilateral ICA siphon  atherosclerosis right more than left Status post IR with TICI3 MRI left insular cortex patchy infarct, punctate left frontal infarct CT repeat no acute finding 2D Echo EF 65 to 70% LDL 68 HgbA1c 5.7 SCDs for VTE prophylaxis Brilinta (ticagrelor) 90 mg bid and Eliquis (apixaban) daily (but per pharmacy, no refill record for either of these medications) prior to admission, now on aspirin 81 mg daily.  Hold off Eliquis for today given severe anemia, will restart tomorrow if Hb stable Patient counseled to be compliant with her antithrombotic medications Ongoing aggressive stroke risk factor management Therapy recommendations: Pending Disposition: Pending  Severe anemia Right groin hemorrhage at incision site Patient denies any leg pain, abdominal pain Examination soft nontender abdomen Hemoglobin 8.5--6.4--2 x PRBC--7.6--7.6 -- 6.9 -1 u PRBCs Close CBC  monitoring Low threshold for CT abdomen to rule out retroperitoneal hemorrhage Hold off Eliquis again for today, continue aspirin for now and will restart eliquis tomorrow if Hb stable  History of stroke 09/2022 left MCA infarct with left M1 occlusion, status post IR with TICI3 and left MCA stenting.  EF 60 to 65%.  LDL 120, A1c 5.5.  Discharged on Brilinta and Eliquis and Crestor 40.  A-fib Rate controlled Home Eliquis but noncompliant per pharmacy record Now Eliquis on hold due to anemia Will consider Eliquis once hemoglobin stabilized  Diabetes HgbA1c 5.7 goal < 7.0 Controlled CBG monitoring SSI DM education and close PCP follow up  Hypertension Stable on the high end Resume home losartan 50 BP goal less than 180/105 now Long-term BP goal normotensive  Hyperlipidemia Home meds: Crestor 40 LDL 68, goal < 70 Now on Crestor 40 Continue statin at discharge  Dysphagia Did not pass swallow Has NG tube On tube feeding  Other Stroke Risk Factors Advanced age Migraines Obstructive sleep apnea  Other Active  Problems History of GBS Dementia History of GI bleeding  Hospital day # 4  Patient is neurologically improved Groin site appears stable.  Repeat hematocrit in the morning and if stable may consider anticoagulation.  No family at the bedside. Check repeat hematocrit if improved will start eliquis and consider Dc home    Delia Heady, MD Medical Director [facility] Pager: [phone] 07/12/2023 12:10 PM    To contact Stroke Continuity provider, please refer to [email or web address]. After hours, contact General Neurology

## 2023-07-13 ENCOUNTER — Other Ambulatory Visit: Payer: Self-pay

## 2023-07-13 DIAGNOSIS — I63512 Cerebral infarction due to unspecified occlusion or stenosis of left middle cerebral artery: Secondary | ICD-10-CM | POA: Diagnosis not present

## 2023-07-13 LAB — BASIC METABOLIC PANEL
Anion gap: 6 (ref 5–15)
BUN: 9 mg/dL (ref 8–23)
CO2: 25 mmol/L (ref 22–32)
Calcium: 8.6 mg/dL — ABNORMAL LOW (ref 8.9–10.3)
Chloride: 107 mmol/L (ref 98–111)
Creatinine, Ser: 1.01 mg/dL — ABNORMAL HIGH (ref 0.44–1.00)
GFR, Estimated: 57 mL/min — ABNORMAL LOW (ref >60)
Glucose, Bld: 96 mg/dL (ref 70–99)
Potassium: 3.7 mmol/L (ref 3.5–5.1)
Sodium: 138 mmol/L (ref 135–145)

## 2023-07-13 LAB — CBC
HCT: 24 % — ABNORMAL LOW (ref 36.0–46.0)
Hemoglobin: 7.6 g/dL — ABNORMAL LOW (ref 12.0–15.0)
MCH: 24.4 pg — ABNORMAL LOW (ref 26.0–34.0)
MCHC: 31.7 g/dL (ref 30.0–36.0)
MCV: 76.9 fL — ABNORMAL LOW (ref 80.0–100.0)
Platelets: 305 10*3/uL (ref 150–400)
RBC: 3.12 MIL/uL — ABNORMAL LOW (ref 3.87–5.11)
RDW: 19.2 % — ABNORMAL HIGH (ref 11.5–15.5)
WBC: 4.2 10*3/uL (ref 4.0–10.5)
nRBC: 0 % (ref 0.0–0.2)

## 2023-07-13 MED ORDER — QUETIAPINE FUMARATE 50 MG PO TABS
50.0000 mg | ORAL_TABLET | Freq: Once | ORAL | Status: AC
  Administered 2023-07-13: 50 mg via ORAL
  Filled 2023-07-13: qty 1

## 2023-07-13 MED ORDER — ENSURE ENLIVE PO LIQD
237.0000 mL | Freq: Two times a day (BID) | ORAL | Status: DC
  Administered 2023-07-14 – 2023-07-15 (×2): 237 mL via ORAL

## 2023-07-13 MED ORDER — APIXABAN 5 MG PO TABS
5.0000 mg | ORAL_TABLET | Freq: Two times a day (BID) | ORAL | Status: DC
  Administered 2023-07-13 – 2023-07-15 (×5): 5 mg via ORAL
  Filled 2023-07-13 (×5): qty 1

## 2023-07-13 NOTE — Progress Notes (Signed)
 PHARMACY - ANTICOAGULATION CONSULT NOTE  Pharmacy Consult for Eliquis Indication: atrial fibrillation  Allergies  Allergen Reactions   Nsaids Other (See Comments)    CAN TAKE ONLY TYLENOL- NOTHING ELSE!!!!!!   Codeine Nausea And Vomiting and Other (See Comments)    GI Intolerance   Lyrica [Pregabalin] Other (See Comments)    Reaction not known at this time   Penicillins Other (See Comments)    Childhood allergy- exact reaction not noted    Patient Measurements: Height: 5\' 2"  (157.5 cm) (from 5/24) Weight: 69.5 kg (153 lb 3.5 oz) IBW/kg (Calculated) : 50.1  Vital Signs: Temp: 98.3 F (36.8 C) (03/10 0319) Temp Source: Oral (03/10 0319) BP: 176/78 (03/10 0319) Pulse Rate: 48 (03/10 0319)  Labs: Recent Labs    07/11/23 0624 07/12/23 1454  HGB 6.9* 8.6*  HCT 22.5* 27.5*  PLT 297 344  CREATININE 0.97 0.88    Estimated Creatinine Clearance: 48.2 mL/min (by C-G formula based on SCr of 0.88 mg/dL).   Medical History: Past Medical History:  Diagnosis Date   Anemia    Anxiety    Arthritis    "all over" (12/08/2017)   Asthma    CHF (congestive heart failure) (HCC)    Chronic lower back pain    COPD (chronic obstructive pulmonary disease) (HCC)    Dementia (HCC)    Depression    Diabetes mellitus without complication (HCC)    Fibromyalgia    Gastric ulcer    GERD (gastroesophageal reflux disease)    Guillain Barr syndrome (HCC)    Hematemesis 12/07/2017   History of blood transfusion    "low blood" (12/08/2017)   History of stomach ulcers    Hypercholesterolemia    Hypertension    Migraine    "used to have them; don't have them anymore" (12/08/2017)   Pneumonia    "probably twice" (12/08/2017)   Sleep apnea    "couldn't take the mask" (12/08/2017)   Stroke (HCC)    "I don't remember if I have had one or not; I believe I have" (12/08/2017)    Medications:  Scheduled:   sodium chloride   Intravenous Once   enoxaparin (LOVENOX) injection  40 mg Subcutaneous Q24H    losartan  50 mg Oral Daily   rosuvastatin  40 mg Oral Daily   sertraline  25 mg Oral Daily   sodium chloride flush  3 mL Intravenous Once    Assessment: 78 y.o. female admitted with CVA, h/o Afib on Eliquis though noncompliant, for Eliquis  Plan:  Eliquis 5 mg BID  Eddie Candle 07/13/2023,7:22 AM

## 2023-07-13 NOTE — Care Management Important Message (Signed)
 Important Message  Patient Details  Name: Melissa [name] MRN: [id number] Date of Birth: [birth year]   Important Message Given:  Yes - Medicare IM     Dorena Bodo 07/13/2023, 3:55 PM

## 2023-07-13 NOTE — TOC Initial Note (Signed)
 Transition of Care [facility]) - Initial/Assessment Note    Patient Details  Name: Melissa [name] MRN: [id number] Date of Birth: 1945-10-11  [facility] CM/SW Contact:    Kermit Balo, RN Phone Number: 07/13/2023, 3:24 PM  Clinical Narrative:                  Pt is from home with her son. Pt sleepy during my visit. She was in agreement with CM talking to family. CM called and spoke to pts sister. Sister is concerned as she states the patient sleeps all day and is up at night watching TV. She says the pts son is mentally slow and pt manages him.  Sister does confirm bed bugs in the home.  Pt is non compliant with her medications at home. CM has attempted to arrange [facility] services but no HH agency will accept due to the bed bugs.  No follow up per therapies.  Pt will need transportation home at d/c.  TOC following.  Expected Discharge Plan: Home/Self Care Barriers to Discharge: Continued Medical Work up   Patient Goals and CMS Choice            Expected Discharge Plan and Services   Discharge Planning Services: CM Consult   Living arrangements for the past 2 months: Single Family Home                                      Prior Living Arrangements/Services Living arrangements for the past 2 months: Single Family Home Lives with:: Adult Children Patient language and need for interpreter reviewed:: Yes Do you feel safe going back to the place where you live?: Yes        Care giver support system in place?: No (comment) Current home services: DME Criminal Activity/Legal Involvement Pertinent to Current Situation/Hospitalization: No - Comment as needed  Activities of Daily Living      Permission Sought/Granted                  Emotional Assessment Appearance:: Appears stated age Attitude/Demeanor/Rapport: Engaged Affect (typically observed): Accepting Orientation: : Oriented to Self, Oriented to Place, Oriented to  Time, Oriented to  Situation   Psych Involvement: No (comment)  Admission diagnosis:  Acute ischemic left MCA stroke (HCC) [I63.512] Acute ischemic stroke [facility]) [I63.9] Middle cerebral artery stenosis, 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] Cerebrovascular accident (CVA) due to occlusion of left middle cerebral artery (HCC) [I63.512] Patient Active Problem List   Diagnosis Date Noted   Acute ischemic left MCA stroke (HCC) 07/08/2023   Middle cerebral artery stenosis, 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   Stroke (cerebrum) (HCC) 09/20/2022   Middle cerebral artery embolism, 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   Hematemesis 12/07/2017   Acute blood loss anemia 12/07/2017   Dementia (HCC) 12/07/2017   CHF (congestive heart failure) (HCC) 12/07/2017   COPD (chronic obstructive pulmonary disease) (HCC) 12/07/2017   History of completed stroke 12/07/2017   Unspecified atrial fibrillation (HCC) 12/07/2017   CKD (chronic kidney disease), stage III (HCC) 12/07/2017   History of diet-controlled diabetes 12/07/2017   Chronic bilateral low back pain without sciatica 10/02/2016   Gastroesophageal reflux disease without esophagitis 08/26/2016   GAD (generalized anxiety disorder) 07/22/2016   Depression 03/19/2016   Insomnia 03/19/2016   Coronary artery disease involving native coronary artery of native heart without angina pectoris 10/05/2014   Mixed hyperlipidemia 10/05/2014   PCP:  Anselmo Pickler, MD Pharmacy:  [id number] Ros[place]ita Levan, [place] - [address] AT [facility] [address] [facility] [address] [place] [address] Phone: [phone] Fax: [phone]  [facility] [address] [place] [address] Phone: [phone] Fax: [phone]     Social Drivers of Health (SDOH) Social History: SDOH Screenings   Food Insecurity: Food Insecurity Present (07/12/2023)  Housing: Patient Unable To Answer (07/12/2023)  Transportation Needs: No Transportation Needs (07/12/2023)  Recent Concern:  Transportation Needs - Unmet Transportation Needs (06/22/2023)   Received from [facility]  Utilities: Not At Risk (07/12/2023)  Financial Resource Strain: Medium Risk (06/22/2023)   Received from [facility]  Physical Activity: Inactive (06/22/2023)   Received from [facility]  Social Connections: Unknown (07/12/2023)  Stress: Stress Concern Present (06/22/2023)   Received from [facility]  Tobacco Use: Low Risk  (06/23/2023)   Received from [facility]  Health Literacy: Medium Risk (06/22/2023)   Received from [facility]   SDOH Interventions:     Readmission Risk Interventions     No data to display

## 2023-07-13 NOTE — Plan of Care (Signed)
  Problem: Education: Goal: Knowledge of disease or condition will improve Outcome: Progressing Goal: Knowledge of secondary prevention will improve (MUST DOCUMENT ALL) Outcome: Progressing Goal: Knowledge of patient specific risk factors will improve (DELETE if not current risk factor) Outcome: Progressing   Problem: Ischemic Stroke/TIA Tissue Perfusion: Goal: Complications of ischemic stroke/TIA will be minimized Outcome: Progressing   Problem: Coping: Goal: Will verbalize positive feelings about self Outcome: Progressing Goal: Will identify appropriate support needs Outcome: Progressing   Problem: Health Behavior/Discharge Planning: Goal: Ability to manage health-related needs will improve Outcome: Progressing Goal: Goals will be collaboratively established with patient/family Outcome: Progressing   Problem: Self-Care: Goal: Ability to participate in self-care as condition permits will improve Outcome: Progressing Goal: Verbalization of feelings and concerns over difficulty with self-care will improve Outcome: Progressing Goal: Ability to communicate needs accurately will improve Outcome: Progressing   Problem: Nutrition: Goal: Risk of aspiration will decrease Outcome: Progressing Goal: Dietary intake will improve Outcome: Progressing   Problem: Education: Goal: Knowledge of General Education information will improve Description: Including pain rating scale, medication(s)/side effects and non-pharmacologic comfort measures Outcome: Progressing   Problem: Health Behavior/Discharge Planning: Goal: Ability to manage health-related needs will improve Outcome: Progressing   Problem: Clinical Measurements: Goal: Ability to maintain clinical measurements within normal limits will improve Outcome: Progressing Goal: Will remain free from infection Outcome: Progressing Goal: Diagnostic test results will improve Outcome: Progressing Goal: Respiratory complications will  improve Outcome: Progressing Goal: Cardiovascular complication will be avoided Outcome: Progressing   Problem: Activity: Goal: Risk for activity intolerance will decrease Outcome: Progressing   Problem: Nutrition: Goal: Adequate nutrition will be maintained Outcome: Progressing   Problem: Coping: Goal: Level of anxiety will decrease Outcome: Progressing   Problem: Elimination: Goal: Will not experience complications related to bowel motility Outcome: Progressing Goal: Will not experience complications related to urinary retention Outcome: Progressing   Problem: Pain Managment: Goal: General experience of comfort will improve and/or be controlled Outcome: Progressing   Problem: Safety: Goal: Ability to remain free from injury will improve Outcome: Progressing   Problem: Skin Integrity: Goal: Risk for impaired skin integrity will decrease Outcome: Progressing   Problem: Education: Goal: Understanding of CV disease, CV risk reduction, and recovery process will improve Outcome: Progressing Goal: Individualized Educational Video(s) Outcome: Progressing   Problem: Activity: Goal: Ability to return to baseline activity level will improve Outcome: Progressing   Problem: Cardiovascular: Goal: Ability to achieve and maintain adequate cardiovascular perfusion will improve Outcome: Progressing Goal: Vascular access site(s) Level 0-1 will be maintained Outcome: Progressing   Problem: Health Behavior/Discharge Planning: Goal: Ability to safely manage health-related needs after discharge will improve Outcome: Progressing

## 2023-07-13 NOTE — Progress Notes (Addendum)
 STROKE TEAM PROGRESS NOTE   SUBJECTIVE (INTERVAL HISTORY) Patient is seen in her room with no family at the bedside.  She has remained hemodynamically stable overnight, and hemoglobin this morning is 7.6.  Plan is to continue Eliquis and recheck hemoglobin tomorrow morning.  OBJECTIVE Temp:  [97.9 F (36.6 C)-98.5 F (36.9 C)] 97.9 F (36.6 C) (03/10 1153) Pulse Rate:  [41-92] 63 (03/10 1153) Cardiac Rhythm: Atrial fibrillation (03/10 0700) Resp:  [16-18] 16 (03/10 1153) BP: (157-177)/(63-90) 166/63 (03/10 1153) SpO2:  [90 %-99 %] 99 % (03/10 1153)  Recent Labs  Lab 07/11/23 2106 07/11/23 2308 07/12/23 0607 07/12/23 1110 07/12/23 1550  GLUCAP 103* 99 96 106* 101*   Recent Labs  Lab 07/09/23 0124 07/09/23 1213 07/10/23 0528 07/11/23 0624 07/12/23 1454 07/13/23 0636  NA 137  --  136 135 135 138  K 3.8  --  3.5 4.3 4.2 3.7  CL 105  --  107 106 103 107  CO2 22  --  23 23 25 25   GLUCOSE 110*  --  122* 97 117* 96  BUN <5*  --  9 13 10 9   CREATININE 0.83  --  0.89 0.97 0.88 1.01*  CALCIUM 8.6*  --  8.3* 8.3* 8.9 8.6*  MG  --  1.6* 1.7  --   --   --   PHOS  --  3.1 3.5  --   --   --    Recent Labs  Lab 07/09/23 0124  AST 19  ALT 12  ALKPHOS 61  BILITOT 1.0  PROT 5.8*  ALBUMIN 3.1*   Recent Labs  Lab 07/09/23 0124 07/09/23 0227 07/09/23 1552 07/10/23 0528 07/11/23 0624 07/12/23 1454 07/13/23 0636  WBC 7.0  --   --  6.7 6.3 5.5 4.2  NEUTROABS 5.8  --   --   --   --   --   --   HGB 6.4*   < > 7.6* 7.6* 6.9* 8.6* 7.6*  HCT 21.7*   < > 24.5* 24.4* 22.5* 27.5* 24.0*  MCV 72.6*  --   --  73.9* 74.8* 77.0* 76.9*  PLT 368  --   --  328 297 344 305   < > = values in this interval not displayed.   No results for input(s): "CKTOTAL", "CKMB", "CKMBINDEX", "TROPONINI" in the last 168 hours. No results for input(s): "LABPROT", "INR" in the last 72 hours.  No results for input(s): "COLORURINE", "LABSPEC", "PHURINE", "GLUCOSEU", "HGBUR", "BILIRUBINUR", "KETONESUR",  "PROTEINUR", "UROBILINOGEN", "NITRITE", "LEUKOCYTESUR" in the last 72 hours.  Invalid input(s): "APPERANCEUR"     Component Value Date/Time   CHOL 128 07/09/2023 0124   TRIG 59 07/09/2023 0124   HDL 48 07/09/2023 0124   CHOLHDL 2.7 07/09/2023 0124   VLDL 12 07/09/2023 0124   LDLCALC 68 07/09/2023 0124   Lab Results  Component Value Date   HGBA1C 5.7 (H) 07/09/2023   No results found for: "LABOPIA", "COCAINSCRNUR", "LABBENZ", "AMPHETMU", "THCU", "LABBARB"  Recent Labs  Lab 07/09/23 0124  ETH <10    I have personally reviewed the radiological images below and agree with the radiology interpretations.  IR PERCUTANEOUS ART THROMBECTOMY/INFUSION INTRACRANIAL INC DIAG ANGIO Result Date: 07/10/2023 INDICATION: New onset aphasia and right-sided weakness. 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 EXAM: 1. EMERGENT LARGE VESSEL OCCLUSION THROMBOLYSIS (anterior CIRCULATION) COMPARISON:  CT angiogram of the head and neck July 08, 2023. MEDICATIONS: No antibiotic was administered within 1 hour  of the procedure. ANESTHESIA/SEDATION: General anesthesia. CONTRAST:  Omnipaque 300 approximately 75 cc. FLUOROSCOPY TIME:  Fluoroscopy Time: 9 minutes 54 seconds (812 mGy). COMPLICATIONS: None immediate. TECHNIQUE: Following a full explanation of the procedure along with the potential associated complications, an informed witnessed consent was obtained. The risks of intracranial hemorrhage of 10%, worsening neurological deficit, ventilator dependency, death and inability to revascularize were all reviewed in detail with the patient's son. The patient was then put under general anesthesia by the Department of Anesthesiology at [facility]. The right groin was prepped and draped in the usual sterile fashion. Thereafter using modified Seldinger technique, transfemoral access into the right common femoral artery was obtained without difficulty.  Over an 0.035 inch guidewire an 8 French 25 cm Pinnacle sheath was inserted. Through this, and also over an 0.035 inch guidewire a combination of an 088 100 cm Zoom support catheter with a 125 cm 6 French Simmons 2 support catheter was advanced to the aortic arch region, and selectively positioned in the left common carotid artery, and the distal left internal carotid artery. FINDINGS: The left common carotid arteriogram demonstrates the left external carotid artery and its major branches to be widely patent. The left internal carotid to the skull base is widely patent with a moderate to severe tortuosity of the mid cervical ICA. The petrous, the cavernous and the supraclinoid left ICA are widely patent with mild stenosis in the caval segment. A left posterior communicating artery is seen opacifying the left posterior cerebral artery distribution. The left anterior cerebral artery opacifies into the capillary and venous phases. The left middle cerebral artery demonstrates a stable moderate stenosis in the mid M1 segment with near complete occlusion distally at the level of the trifurcation branches. PROCEDURE: Through the 088 support catheter in the distal cervical ICA, a combination of an 062 Penumbra aspiration catheter with an 043 distal aspiration catheter was advanced over an 035 inch colossal guidewire to the supraclinoid left ICA. The wire was then advanced through the occluded stented segment of the middle cerebral artery into the distal stented segment in the superior division followed by advancement of the 043 and the 062 aspiration catheters into the occluded stent. The wire was removed. Aspiration was then applied for about 30 seconds at the hub of the 043 aspiration catheter, with a Penumbra aspiration pump at the hub of the 062 aspiration catheter for approximately 2 minutes. Following retrieval of the two aspiration catheters, a control arteriogram performed through the 088 support catheter  demonstrated complete revascularization of the left middle cerebral artery distribution achieving a TICI 3 revascularization. Also noted was patency of the stented segment and also of the proximal left middle cerebral artery M1 segment significantly improved compared to the pre treatment caliber. Control arteriogram was then performed at 10, 20 and 35 minutes post revascularization. These continued to demonstrate improved caliber of the left middle cerebral artery distribution. The patient was given a total of 4.5 mg of intra arterial Integrilin in order to prevent platelet aggregation within the stent. A final control arteriogram performed through the 088 support catheter in the left common carotid artery demonstrates the left internal carotid artery to be widely patent to the cranial skull base with no change in the tortuosity. Intracranially, the left middle cerebral artery and the left anterior cerebral artery distributions continued to demonstrate wide patency and TICI 3 revascularization. The 088 support catheter was removed. An 8 French Angio-Seal closure device was used for hemostasis at the right groin  puncture site. Distal pulses remained present bilaterally unchanged from prior to the procedure. A flat panel CT of the brain demonstrated no evidence of hemorrhagic complications. The patient was extubated. Upon recovery, the patient followed simple commands intermittently appropriately. Movement was noted in the right upper and right lower extremity. Patient was then transferred to the neuro ICU for post revascularization care. IMPRESSION: Status post complete revascularization of near complete occlusion of the left middle cerebral artery stented segment with 1 pass with a combination of 062 and 043 aspiration catheters achieving a TICI 3 revascularization. PLAN: As per referring MD. Electronically Signed   By: Julieanne Cotton M.D.   On: 07/10/2023 08:50   IR CT Head Ltd Result Date:  07/10/2023 INDICATION: New onset aphasia and right-sided weakness. 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 EXAM: 1. EMERGENT LARGE VESSEL OCCLUSION THROMBOLYSIS (anterior CIRCULATION) COMPARISON:  CT angiogram of the head and neck July 08, 2023. MEDICATIONS: No antibiotic was administered within 1 hour of the procedure. ANESTHESIA/SEDATION: General anesthesia. CONTRAST:  Omnipaque 300 approximately 75 cc. FLUOROSCOPY TIME:  Fluoroscopy Time: 9 minutes 54 seconds (812 mGy). COMPLICATIONS: None immediate. TECHNIQUE: Following a full explanation of the procedure along with the potential associated complications, an informed witnessed consent was obtained. The risks of intracranial hemorrhage of 10%, worsening neurological deficit, ventilator dependency, death and inability to revascularize were all reviewed in detail with the patient's son. The patient was then put under general anesthesia by the Department of Anesthesiology at [facility]. The right groin was prepped and draped in the usual sterile fashion. Thereafter using modified Seldinger technique, transfemoral access into the right common femoral artery was obtained without difficulty. Over an 0.035 inch guidewire an 8 French 25 cm Pinnacle sheath was inserted. Through this, and also over an 0.035 inch guidewire a combination of an 088 100 cm Zoom support catheter with a 125 cm 6 French Simmons 2 support catheter was advanced to the aortic arch region, and selectively positioned in the left common carotid artery, and the distal left internal carotid artery. FINDINGS: The left common carotid arteriogram demonstrates the left external carotid artery and its major branches to be widely patent. The left internal carotid to the skull base is widely patent with a moderate to severe tortuosity of the mid cervical ICA. The petrous, the cavernous and the supraclinoid left ICA are widely patent with  mild stenosis in the caval segment. A left posterior communicating artery is seen opacifying the left posterior cerebral artery distribution. The left anterior cerebral artery opacifies into the capillary and venous phases. The left middle cerebral artery demonstrates a stable moderate stenosis in the mid M1 segment with near complete occlusion distally at the level of the trifurcation branches. PROCEDURE: Through the 088 support catheter in the distal cervical ICA, a combination of an 062 Penumbra aspiration catheter with an 043 distal aspiration catheter was advanced over an 035 inch colossal guidewire to the supraclinoid left ICA. The wire was then advanced through the occluded stented segment of the middle cerebral artery into the distal stented segment in the superior division followed by advancement of the 043 and the 062 aspiration catheters into the occluded stent. The wire was removed. Aspiration was then applied for about 30 seconds at the hub of the 043 aspiration catheter, with a Penumbra aspiration pump at the hub of the 062 aspiration catheter for approximately 2 minutes. Following retrieval of the two aspiration catheters, a control  arteriogram performed through the 088 support catheter demonstrated complete revascularization of the left middle cerebral artery distribution achieving a TICI 3 revascularization. Also noted was patency of the stented segment and also of the proximal left middle cerebral artery M1 segment significantly improved compared to the pre treatment caliber. Control arteriogram was then performed at 10, 20 and 35 minutes post revascularization. These continued to demonstrate improved caliber of the left middle cerebral artery distribution. The patient was given a total of 4.5 mg of intra arterial Integrilin in order to prevent platelet aggregation within the stent. A final control arteriogram performed through the 088 support catheter in the left common carotid artery demonstrates  the left internal carotid artery to be widely patent to the cranial skull base with no change in the tortuosity. Intracranially, the left middle cerebral artery and the left anterior cerebral artery distributions continued to demonstrate wide patency and TICI 3 revascularization. The 088 support catheter was removed. An 8 French Angio-Seal closure device was used for hemostasis at the right groin puncture site. Distal pulses remained present bilaterally unchanged from prior to the procedure. A flat panel CT of the brain demonstrated no evidence of hemorrhagic complications. The patient was extubated. Upon recovery, the patient followed simple commands intermittently appropriately. Movement was noted in the right upper and right lower extremity. Patient was then transferred to the neuro ICU for post revascularization care. IMPRESSION: Status post complete revascularization of near complete occlusion of the left middle cerebral artery stented segment with 1 pass with a combination of 062 and 043 aspiration catheters achieving a TICI 3 revascularization. PLAN: As per referring MD. Electronically Signed   By: Julieanne Cotton M.D.   On: 07/10/2023 08:50   ECHOCARDIOGRAM COMPLETE Result Date: 07/09/2023    ECHOCARDIOGRAM REPORT   Patient Name:   DIEM DICOCCO Date of Exam: 07/09/2023 Medical Rec #:  [id number]        Height:       62.0 in Accession #:    [id number]       Weight:       153.9 lb Date of Birth:  [birth year]        BSA:          1.710 m Patient Age:    77 years         BP:           112/88 mmHg Patient Gender: F                HR:           57 bpm. Exam Location:  Inpatient Procedure: 2D Echo, Cardiac Doppler and Color Doppler (Both Spectral and Color            Flow Doppler were utilized during procedure). Indications:    Stroke I63.9  History:        Patient has prior history of Echocardiogram examinations, most                 recent 06/29/2023. CHF, CAD, Stroke, CKD 3 and COPD; Risk                  Factors:Dyslipidemia and Non-Smoker.  Sonographer:    Dondra Prader RVT RCS Referring Phys: [id number] Lynnae January IMPRESSIONS  1. Left ventricular ejection fraction, by estimation, is 65 to 70%. The left ventricle has normal function. The left ventricle has no regional wall motion abnormalities. Left ventricular diastolic parameters were normal.  2. Right ventricular systolic function is  normal. The right ventricular size is normal.  3. Left atrial size was severely dilated.  4. The mitral valve is normal in structure. Mild mitral valve regurgitation.  5. The aortic valve is tricuspid. Aortic valve regurgitation is not visualized. Aortic valve sclerosis/calcification is present, without any evidence of aortic stenosis. Comparison(s): The left ventricular function is unchanged. FINDINGS  Left Ventricle: Left ventricular ejection fraction, by estimation, is 65 to 70%. The left ventricle has normal function. The left ventricle has no regional wall motion abnormalities. The left ventricular internal cavity size was normal in size. There is  no left ventricular hypertrophy. Left ventricular diastolic parameters were normal. Right Ventricle: The right ventricular size is normal. Right vetricular wall thickness was not assessed. Right ventricular systolic function is normal. Left Atrium: Left atrial size was severely dilated. Right Atrium: Right atrial size was normal in size. Pericardium: Trivial pericardial effusion is present. Mitral Valve: The mitral valve is normal in structure. Mild mitral valve regurgitation. Tricuspid Valve: The tricuspid valve is normal in structure. Tricuspid valve regurgitation is mild. Aortic Valve: The aortic valve is tricuspid. Aortic valve regurgitation is not visualized. Aortic valve sclerosis/calcification is present, without any evidence of aortic stenosis. Aortic valve mean gradient measures 6.0 mmHg. Aortic valve peak gradient measures 11.1 mmHg. Aortic valve area, by VTI measures 2.30  cm. Pulmonic Valve: The pulmonic valve was normal in structure. Pulmonic valve regurgitation is not visualized. Aorta: The aortic root and ascending aorta are structurally normal, with no evidence of dilitation. IAS/Shunts: No atrial level shunt detected by color flow Doppler.  LEFT VENTRICLE PLAX 2D LVIDd:         4.40 cm   Diastology LVIDs:         2.80 cm   LV e' medial:    8.11 cm/s LV PW:         1.20 cm   LV E/e' medial:  20.4 LV IVS:        0.90 cm   LV e' lateral:   7.26 cm/s LVOT diam:     2.00 cm   LV E/e' lateral: 22.8 LV SV:         85 LV SV Index:   50 LVOT Area:     3.14 cm  RIGHT VENTRICLE             IVC RV Basal diam:  3.75 cm     IVC diam: 2.90 cm RV Mid diam:    2.70 cm RV S prime:     11.35 cm/s TAPSE (M-mode): 1.7 cm LEFT ATRIUM             Index        RIGHT ATRIUM           Index LA diam:        3.90 cm 2.28 cm/m   RA Area:     20.30 cm LA Vol (A2C):   89.9 ml 52.57 ml/m  RA Volume:   61.60 ml  36.02 ml/m LA Vol (A4C):   92.0 ml 53.80 ml/m LA Biplane Vol: 94.9 ml 55.49 ml/m  AORTIC VALVE                     PULMONIC VALVE AV Area (Vmax):    2.28 cm      PV Vmax:       0.96 m/s AV Area (Vmean):   2.34 cm      PV Peak grad:  3.7 mmHg AV Area (VTI):  2.30 cm AV Vmax:           166.33 cm/s AV Vmean:          111.667 cm/s AV VTI:            0.372 m AV Peak Grad:      11.1 mmHg AV Mean Grad:      6.0 mmHg LVOT Vmax:         120.75 cm/s LVOT Vmean:        83.350 cm/s LVOT VTI:          0.272 m LVOT/AV VTI ratio: 0.73  AORTA Ao Root diam: 2.70 cm Ao Asc diam:  3.20 cm MITRAL VALVE                TRICUSPID VALVE MV Area (PHT): 3.42 cm     TR Peak grad:   44.9 mmHg MV Decel Time: 222 msec     TR Vmax:        335.00 cm/s MV E velocity: 165.67 cm/s MV A velocity: 58.90 cm/s   SHUNTS MV E/A ratio:  2.81         Systemic VTI:  0.27 m                             Systemic Diam: 2.00 cm Dietrich Pates MD Electronically signed by Dietrich Pates MD Signature Date/Time: 07/09/2023/2:00:23 PM    Final    CT  HEAD WO CONTRAST ( ) Result Date: 07/09/2023 CLINICAL DATA:  78 year old female recurrent code stroke presentation on 07/08/2023, left MCA M1 stent thrombus. Status post endovascular revascularization. Punctate left hemisphere infarcts on post treatment MRI. EXAM: CT HEAD WITHOUT CONTRAST TECHNIQUE: Contiguous axial images were obtained from the base of the skull through the vertex without intravenous contrast. RADIATION DOSE REDUCTION: This exam was performed according to the departmental dose-optimization program which includes automated exposure control, adjustment of the mA and/or kV according to patient size and/or use of iterative reconstruction technique. COMPARISON:  Brain MRI yesterday and earlier. FINDINGS: Brain: No acute intracranial hemorrhage identified. No midline shift, mass effect, or evidence of intracranial mass lesion. No ventriculomegaly. Gray-white differentiation appears stable by CT since presentation yesterday with chronic ischemic changes in the left MCA territory, confluent chronic cerebral white matter disease. Subtle diffusion abnormality by MRI is not correlated. And no new cortically based infarct is identified. Vascular: Extensive Calcified atherosclerosis at the skull base. Left MCA stent redemonstrated. Skull: Stable, intact. Sinuses/Orbits: Left nasoenteric tube now in place. Visualized paranasal sinuses and mastoids are stable and well aerated. Other: No acute orbit or scalp soft tissue finding. IMPRESSION: No acute intracranial hemorrhage and subtle Left MCA territory ischemia on MRI is occult by CT. Stable chronic white matter and ischemic disease. No new intracranial abnormality. Electronically Signed   By: Odessa Fleming M.D.   On: 07/09/2023 06:28   DG Abd 1 View Result Date: 07/09/2023 CLINICAL DATA:  [id number].  Encounter for orogastric tube placement. EXAM: ABDOMEN - 1 VIEW COMPARISON:  Chest, abdomen and pelvis CT 06/29/2023 FINDINGS: There is a large hiatal hernia with about  half of the stomach intrathoracic. NGT is in place and coiled within the herniated portion of the stomach. There is mild dilatation of some of the left abdominal small bowel segments up to 3.3 cm which could be due to ileus or partial small bowel obstruction. Gas and stool are present in the colon at least to the proximal  descending segment. There are cholecystectomy clips. No supine evidence of free air. Lung bases are clear. Cardiomegaly. IMPRESSION: 1. Large hiatal hernia with about half of the stomach intrathoracic. 2. NGT is in place and coiled within the herniated portion of the stomach. 3. Mild dilatation of some of the left abdominal small bowel segments up to 3.3 cm which could be due to ileus or partial small bowel obstruction. Electronically Signed   By: Almira Bar M.D.   On: 07/09/2023 06:12   MR BRAIN WO CONTRAST Result Date: 07/09/2023 CLINICAL DATA:  Stroke, follow up EXAM: MRI HEAD WITHOUT CONTRAST TECHNIQUE: Multiplanar, multiecho pulse sequences of the brain and surrounding structures were obtained without intravenous contrast. COMPARISON:  MRI head May 21 24. FINDINGS: Brain: Small acute infarct in the left insula and a few punctate acute infarcts in the left frontal lobe. No significant mass effect. Remote left basal ganglia infarcts. Patchy additional T2/FLAIR hyperintensities in the white matter, compatible with chronic microvascular ischemic change. No midline shift. No hydrocephalus. No mass lesion. Vascular: Major arterial flow voids are maintained at the skull base. Stented left MCA. Skull and upper cervical spine: Normal marrow signal. Sinuses/Orbits: Clear sinuses.  No acute orbital findings. Other: No mastoid effusions. IMPRESSION: Small acute infarct in the left insula and a few punctate acute infarcts in the left frontal lobe Electronically Signed   By: Feliberto Harts M.D.   On: 07/09/2023 01:53   CT ANGIO HEAD NECK W WO CM W PERF (CODE STROKE) Result Date:  07/08/2023 CLINICAL DATA:  Provided history: Neuro deficit, acute, stroke suspected. Slurred speech. Right-sided weakness. Confusion. Unable to follow commands. EXAM: CT ANGIOGRAPHY HEAD AND NECK CT PERFUSION BRAIN TECHNIQUE: Multidetector CT imaging of the head and neck was performed using the standard protocol during bolus administration of intravenous contrast. Multiplanar CT image reconstructions and MIPs were obtained to evaluate the vascular anatomy. Carotid stenosis measurements (when applicable) are obtained utilizing NASCET criteria, using the distal internal carotid diameter as the denominator. Multiphase CT imaging of the brain was performed following IV bolus contrast injection. Subsequent parametric perfusion maps were calculated using RAPID software. RADIATION DOSE REDUCTION: This exam was performed according to the departmental dose-optimization program which includes automated exposure control, adjustment of the mA and/or kV according to patient size and/or use of iterative reconstruction technique. CONTRAST:  OMNIPAQUE IOHEXOL 350 MG/ML SOLN COMPARISON:  Non-contrast head CT performed earlier today 07/08/2023. CTA head/neck 09/19/2022. Brain MRI 09/23/2022. CT chest/abdomen/pelvis 06/29/2023. FINDINGS: CTA NECK FINDINGS Aortic arch: Standard aortic branching. Atherosclerotic plaque within the visualized thoracic aorta and proximal major branch vessels of the neck. No hemodynamically significant innominate or proximal subclavian artery stenosis. Right carotid system: CCA and ICA patent within the neck without hemodynamically significant stenosis (50% or greater). Atherosclerotic plaque at the CCA origin and about the carotid bifurcation. Left carotid system: CCA and ICA patent within the neck without hemodynamically significant stenosis (50% or greater). Atherosclerotic plaque at the CCA origin, scattered elsewhere within the CCA and about the carotid bifurcation. Tortuosity of the cervical ICA.  Mild nonspecific fusiform dilation of the mid-to-distal cervical ICA, unchanged from the prior CTA of 09/19/2022. Vertebral arteries: The vertebral arteries are patent within the neck without stenosis or significant atherosclerotic disease. The left vertebral artery is slightly dominant. Skeleton: Spondylosis of the cervical and visualized upper thoracic levels. The patient is edentulous. Other neck: No neck mass or cervical lymphadenopathy. Upper chest: 14 mm part-solid right upper lobe pulmonary nodule as was demonstrated on  the recent prior CT chest/abdomen/pelvis of 09/19/2022 (series 7, image 391). Review of the MIP images confirms the above findings CTA HEAD FINDINGS Anterior circulation: The intracranial internal carotid arteries are patent. Atherosclerotic plaque within both vessels with no more than mild stenosis. A vascular stent is present within the M1 and M2 left middle cerebral artery. There is a subocclusive filling defect within the stented portion of the distal M1 left middle cerebral artery consistent with thrombus (for instance as seen on series 9, image 166) (series 7, image 132). This results in a severe stenosis. No left M2 proximal branch occlusion is identified. Severe stenosis within an M3 left MCA vessel (series 13, image 29). The right middle cerebral artery M1 segment is patent. No right M2 proximal branch occlusion or high-grade proximal arterial stenosis. The anterior cerebral arteries are patent. 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 Posterior circulation: The intracranial vertebral arteries are patent. Mild atherosclerotic irregularity of the V4 right vertebral artery. The basilar artery is patent. The posterior cerebral arteries are patent. A left posterior communicating artery is present. The right posterior communicating  artery is diminutive or absent. Venous sinuses: Within the limitations of contrast timing, no convincing thrombus. Anatomic variants: As described. Review of the MIP images confirms the above findings CT Brain Perfusion Findings: CBF (<30%) Volume: 8mL Perfusion (Tmax>6.0s) volume: 56mL Mismatch Volume: 48mL Infarction Location:Left MCA vascular territory. CTA head impressions #1 and #2 These results were called by telephone at the time of interpretation on 07/08/2023 at 6:41 pm to provider [facility] , who verbally acknowledged these results. IMPRESSION: CTA neck: 1. The common carotid and internal carotid arteries are patent within the neck without hemodynamically significant stenosis. Atherosclerotic plaque bilaterally, as described. 2. Vertebral arteries patent within the neck without stenosis or significant atherosclerotic disease. 3. Aortic Atherosclerosis (ICD10-I70.0). 4. 14 mm part-solid right upper lobe pulmonary nodule as was demonstrated on the recent prior CT chest/abdomen/pelvis of 06/29/2023 (series 7, image 391). Consider one of the following: a follow-up non-contrast chest CT 3 months from the previous examination of 06/29/2023, a follow-up PET-CT or tissue sampling. This recommendation follows the consensus statement: Guidelines for Management of Incidental Pulmonary Nodules Detected on CT Images: From the Fleischner Society 2017; Radiology 2017; 284:228-243. CTA head: 1. Subocclusive filling defect (consistent with thrombus) within the stented portion of the left middle cerebral artery M1 segment. Resultant severe stenosis at this site. 2. Severe stenosis within a left middle cerebral artery M3 segment vessel. 3. Background intracranial sclerotic disease as described. 4. 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 CT perfusion head: The perfusion software identifies an 8 mL core infarct within the left MCA  vascular territory. The perfusion software identifies a 56 mL region of critically hypoperfused parenchyma within the left MCA vascular territory (utilizing the Tmax>6 seconds threshold.). Reported mismatch volume: 48 mL. Electronically Signed   By: Jackey Loge D.O.   On: 07/08/2023 19:18   CT HEAD CODE STROKE WO CONTRAST Result Date: 07/08/2023 CLINICAL DATA:  Code stroke. Provided history: Slurred speech. Right-sided weakness. Confusion. Inability to follow commands. EXAM: CT HEAD WITHOUT CONTRAST TECHNIQUE: Contiguous axial images were obtained from the base of the skull through the vertex without intravenous contrast. RADIATION DOSE REDUCTION: This exam was performed according to the departmental dose-optimization program which  includes automated exposure control, adjustment of the mA and/or kV according to patient size and/or use of iterative reconstruction technique. COMPARISON:  Brain MRI 09/23/2022. FINDINGS: Brain: Generalized cerebral and cerebellar atrophy. Chronic infarcts within the left basal ganglia. Small chronic cortical infarct within the mid left frontal lobe (series 6, image 22). Background moderate patchy and ill-defined hypoattenuation within the cerebral white matter, nonspecific but compatible with chronic small vessel ischemic disease. There is no acute intracranial hemorrhage. No acute demarcated cortical infarct. No extra-axial fluid collection. No evidence of an intracranial mass. No midline shift. Vascular: Atherosclerotic calcifications. Vascular stent along the course of the left middle cerebral artery M1 and proximal M2 segments. No hyperdense vessel identified elsewhere. Skull: No calvarial fracture or aggressive osseous lesion. Sinuses/Orbits: No mass or acute finding within the imaged orbits. Minimal mucosal thickening within bilateral ethmoid air cells. ASPECTS [facility] Stroke Program Early CT Score) - Ganglionic level infarction (caudate, lentiform nuclei, internal capsule,  insula, M1-M3 cortex): 7 - Supraganglionic infarction (M4-M6 cortex): 3 Total score (0-10 with 10 being normal): 10 (when discounting chronic infarcts). No evidence of an acute intracranial abnormality. These results were communicated to Dr. Amada Jupiter at 6:33 pmon 3/5/2025by text page via the [facility] messaging system. IMPRESSION: 1.  No evidence of an acute intracranial abnormality. 2. Parenchymal atrophy, chronic small vessel ischemic disease and chronic infarcts, as described. 3. Vascular stent along the course of the left middle cerebral artery M1 and M2 segments. Electronically Signed   By: Jackey Loge D.O.   On: 07/08/2023 18:37     PHYSICAL EXAM  Temp:  [97.9 F (36.6 C)-98.5 F (36.9 C)] 97.9 F (36.6 C) (03/10 1153) Pulse Rate:  [41-92] 63 (03/10 1153) Resp:  [16-18] 16 (03/10 1153) BP: (157-177)/(63-90) 166/63 (03/10 1153) SpO2:  [90 %-99 %] 99 % (03/10 1153)  General - Well nourished, well developed, in no apparent distress.   Cardiovascular - irregularly irregular heart rate and rhythm.  Neuro - awake, alert, eyes open, orientated to age, place, time. No aphasia, paucity of language, following all simple commands.  Voice is not mildly dysarthric. no gaze palsy, tracking bilaterally, visual field full, PERRL. No facial droop. Tongue midline. Bilateral UEs 4/5, no drift. Bilaterally LEs 4/5, no drift. Sensation symmetrical bilaterally, b/l FTN intact grossly, gait not tested.    ASSESSMENT/PLAN Ms. PHAEDRA COLGATE is a 78 y.o. female with history of A-fib on Eliquis, hypertension, hyperlipidemia, diabetes, migraine, OSA not on CPAP, dementia, CHF, GBS, GI bleeding, stroke status post stenting on Brilinta admitted for right-sided weakness, slurred speech, right facial droop and aphasia. No TNK given due to on Eliquis.    Stroke:  left subinsular patchy and punctate left frontal infarcts, embolic likely secondary to A-fib not compliant with Eliquis CT no acute finding CT head and  neck left M1 stent distal subocclusive occlusion.  Left M3 severe stenosis.  Bilateral ICA siphon atherosclerosis right more than left Status post IR with TICI3 MRI left insular cortex patchy infarct, punctate left frontal infarct CT repeat no acute finding 2D Echo EF 65 to 70% LDL 68 HgbA1c 5.7 SCDs for VTE prophylaxis Brilinta (ticagrelor) 90 mg bid and Eliquis (apixaban) daily (but per pharmacy, no refill record for either of these medications) prior to admission, now on aspirin 81 mg daily.  Eliquis restarted today, will continue to monitor hemoglobin and discontinue if hemoglobin drops again. Patient counseled to be compliant with her antithrombotic medications Ongoing aggressive stroke risk factor management Therapy recommendations: Pending Disposition: Pending  Severe anemia Right groin hemorrhage at incision site Patient denies any leg pain, abdominal pain Examination soft nontender abdomen Hemoglobin 8.5--6.4--2 x PRBC--7.6--7.6 -- 6.9 -1 u PRBCs Close CBC monitoring Low threshold for CT abdomen to rule out retroperitoneal hemorrhage Eliquis restarted, will monitor hemoglobin tomorrow morning and discontinue if hemoglobin drops  History of stroke 09/2022 left MCA infarct with left M1 occlusion, status post IR with TICI3 and left MCA stenting.  EF 60 to 65%.  LDL 120, A1c 5.5.  Discharged on Brilinta and Eliquis and Crestor 40.  A-fib Rate controlled Home Eliquis but noncompliant per pharmacy record Continue Eliquis as inpatient  Diabetes HgbA1c 5.7 goal < 7.0 Controlled CBG monitoring SSI DM education and close PCP follow up  Hypertension Stable on the high end Resume home losartan 50 BP goal less than 180/105 now Long-term BP goal normotensive  Hyperlipidemia Home meds: Crestor 40 LDL 68, goal < 70 Now on Crestor 40 Continue statin at discharge  Dysphagia Did not pass swallow Has NG tube On tube feeding  Other Stroke Risk Factors Advanced  age Migraines Obstructive sleep apnea  Other Active Problems History of GBS Dementia History of GI bleeding  Hospital day # 5 Patient seen by NP and then by MD, MD to edit note as needed. Cortney E Ernestina Columbia , MSN, AGACNP-BC [facility] See Amion for schedule and pager information 07/13/2023 12:26 PM   I have personally obtained history,examined this patient, reviewed notes, independently viewed imaging studies, participated in medical decision making and plan of care.ROS completed by me personally and pertinent positives fully documented  I have made any additions or clarifications directly to the above note. Agree with note above.  Start Eliquis today and follow hematocrit closely.  Patient lives at home with her son who has epilepsy and I am not sure she will have those support that she needs hence will consult case manager to look at home situation and decide if she is safe to go home or needs skilled nursing facility.  Greater than 50% time during this 35-minute visit were spent in counseling and coordination of care and discussion with patient and care team and answering questions.  Delia Heady, MD Medical Director [facility] Pager: [phone] 07/13/2023 1:23 PM   To contact Stroke Continuity provider, please refer to [email or web address]. After hours, contact General Neurology

## 2023-07-13 NOTE — Progress Notes (Signed)
 Physical Therapy Treatment Patient Details Name: Melissa [name] MRN: [id number] DOB: 09/04/1945 Today's Date: 07/13/2023   History of Present Illness The pt is a 78 yo female presenting 3/5 with  right-sided facial droop and weakness. Work up revealed: infarct of L MCA, s/p thrombectomy 3/5. Pt with bleeding from groin site 3/6. 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    PT Comments  Pt tolerated treatment well today. Pt today was able to ambulate in room with RW Mod I even dancing at times. Pt presents at or near baseline mobility. Pt has no further acute PT needs and will be signing off. No change in DC/DME recs at this time. Re consult PT if mobility status changes.     If plan is discharge home, recommend the following: A little help with walking and/or transfers;Assistance with cooking/housework;Direct supervision/assist for medications management;Direct supervision/assist for financial management;Assist for transportation;Help with stairs or ramp for entrance   Can travel by private vehicle        Equipment Recommendations  None recommended by PT    Recommendations for Other Services       Precautions / Restrictions Precautions Precautions: Fall Recall of Precautions/Restrictions: Intact Restrictions Weight Bearing Restrictions Per Provider Order: No     Mobility  Bed Mobility Overal bed mobility: Independent                  Transfers Overall transfer level: Needs assistance Equipment used: Rolling walker (2 wheels) Transfers: Sit to/from Stand Sit to Stand: Supervision           General transfer comment: Cues for hand placement    Ambulation/Gait Ambulation/Gait assistance: Modified independent (Device/Increase time) Gait Distance (Feet): 60 Feet Assistive device: Rolling walker (2  wheels) Gait Pattern/deviations: Step-through pattern, Decreased stride length Gait velocity: decreased     General Gait Details: pt with small strides, but may be more due to limited space walking in room. Pt intermittently dancing while using RW. Appears to be at baseline.   Stairs             Wheelchair Mobility     Tilt Bed    Modified Rankin (Stroke Patients Only)       Balance Overall balance assessment: Mild deficits observed, not formally tested                                          Communication Communication Communication: Impaired Factors Affecting Communication: Reduced clarity of speech;Hearing impaired  Cognition Arousal: Alert Behavior During Therapy: Flat affect   PT - Cognitive impairments: No family/caregiver present to determine baseline                         Following commands: Intact      Cueing Cueing Techniques: Gestural cues, Verbal cues  Exercises      General Comments General comments (skin integrity, edema, etc.): VSS      Pertinent Vitals/Pain Pain Assessment Pain Assessment: No/denies pain    Home Living                          Prior Function            PT Goals (current goals  can now be found in the care plan section) Progress towards PT goals: Goals met/education completed, patient discharged from PT    Frequency    Min 2X/week      PT Plan      Co-evaluation              AM-PAC PT "6 Clicks" Mobility   Outcome Measure  Help needed turning from your back to your side while in a flat bed without using bedrails?: None Help needed moving from lying on your back to sitting on the side of a flat bed without using bedrails?: None Help needed moving to and from a bed to a chair (including a wheelchair)?: A Little Help needed standing up from a chair using your arms (e.g., wheelchair or bedside chair)?: A Little Help needed to walk in hospital room?: None Help  needed climbing 3-5 steps with a railing? : A Little 6 Click Score: 21    End of Session Equipment Utilized During Treatment: Gait belt Activity Tolerance: Patient tolerated treatment well Patient left: in bed;with call bell/phone within reach Nurse Communication: Mobility status PT Visit Diagnosis: Unsteadiness on feet (R26.81);Muscle weakness (generalized) (M62.81)     Time: 1610-9604 PT Time Calculation (min) (ACUTE ONLY): 16 min  Charges:    $Gait Training: 8-22 mins PT General Charges $$ ACUTE PT VISIT: 1 Visit                     Shela Nevin, PT, DPT Acute Rehab Services [phone]    Gladys Damme 07/13/2023, 2:46 PM

## 2023-07-13 NOTE — Discharge Instructions (Signed)

## 2023-07-14 ENCOUNTER — Encounter (HOSPITAL_COMMUNITY): Payer: Self-pay | Admitting: Neurology

## 2023-07-14 DIAGNOSIS — I63512 Cerebral infarction due to unspecified occlusion or stenosis of left middle cerebral artery: Secondary | ICD-10-CM | POA: Diagnosis not present

## 2023-07-14 LAB — BASIC METABOLIC PANEL
Anion gap: 8 (ref 5–15)
BUN: 9 mg/dL (ref 8–23)
CO2: 24 mmol/L (ref 22–32)
Calcium: 9.2 mg/dL (ref 8.9–10.3)
Chloride: 105 mmol/L (ref 98–111)
Creatinine, Ser: 0.95 mg/dL (ref 0.44–1.00)
GFR, Estimated: >60 mL/min (ref >60)
Glucose, Bld: 92 mg/dL (ref 70–99)
Potassium: 4 mmol/L (ref 3.5–5.1)
Sodium: 137 mmol/L (ref 135–145)

## 2023-07-14 LAB — CBC
HCT: 29.4 % — ABNORMAL LOW (ref 36.0–46.0)
Hemoglobin: 9.2 g/dL — ABNORMAL LOW (ref 12.0–15.0)
MCH: 24.4 pg — ABNORMAL LOW (ref 26.0–34.0)
MCHC: 31.3 g/dL (ref 30.0–36.0)
MCV: 78 fL — ABNORMAL LOW (ref 80.0–100.0)
Platelets: 385 10*3/uL (ref 150–400)
RBC: 3.77 MIL/uL — ABNORMAL LOW (ref 3.87–5.11)
RDW: 19.8 % — ABNORMAL HIGH (ref 11.5–15.5)
WBC: 4.9 10*3/uL (ref 4.0–10.5)
nRBC: 0 % (ref 0.0–0.2)

## 2023-07-14 MED ORDER — ONDANSETRON HCL 4 MG/2ML IJ SOLN
4.0000 mg | Freq: Once | INTRAMUSCULAR | Status: AC
  Administered 2023-07-15: 4 mg via INTRAVENOUS
  Filled 2023-07-14: qty 2

## 2023-07-14 MED ORDER — HYDRALAZINE HCL 20 MG/ML IJ SOLN
10.0000 mg | Freq: Once | INTRAMUSCULAR | Status: AC
  Administered 2023-07-15: 10 mg via INTRAVENOUS
  Filled 2023-07-14: qty 1

## 2023-07-14 NOTE — Progress Notes (Signed)
 BP 193/67, neurologist on call notified. Pt asymptomatic. Pt c/o nausea, MD notified. New orders p[laced.

## 2023-07-14 NOTE — Plan of Care (Signed)
 Problem: Education: Goal: Knowledge of disease or condition will improve 07/14/2023 0627 by Pamala Duffel, RN Outcome: Progressing 07/14/2023 0455 by Pamala Duffel, RN Outcome: Progressing Goal: Knowledge of secondary prevention will improve (MUST DOCUMENT ALL) 07/14/2023 0627 by Pamala Duffel, RN Outcome: Progressing 07/14/2023 0455 by Pamala Duffel, RN Outcome: Progressing Goal: Knowledge of patient specific risk factors will improve (DELETE if not current risk factor) 07/14/2023 0627 by Pamala Duffel, RN Outcome: Progressing 07/14/2023 0455 by Pamala Duffel, RN Outcome: Progressing   Problem: Ischemic Stroke/TIA Tissue Perfusion: Goal: Complications of ischemic stroke/TIA will be minimized 07/14/2023 0627 by Pamala Duffel, RN Outcome: Progressing 07/14/2023 0455 by Pamala Duffel, RN Outcome: Progressing   Problem: Coping: Goal: Will verbalize positive feelings about self 07/14/2023 0627 by Pamala Duffel, RN Outcome: Progressing 07/14/2023 0455 by Pamala Duffel, RN Outcome: Progressing Goal: Will identify appropriate support needs 07/14/2023 0627 by Pamala Duffel, RN Outcome: Progressing 07/14/2023 0455 by Pamala Duffel, RN Outcome: Progressing   Problem: Health Behavior/Discharge Planning: Goal: Ability to manage health-related needs will improve 07/14/2023 0627 by Pamala Duffel, RN Outcome: Progressing 07/14/2023 0455 by Pamala Duffel, RN Outcome: Progressing Goal: Goals will be collaboratively established with patient/family 07/14/2023 0627 by Pamala Duffel, RN Outcome: Progressing 07/14/2023 0455 by Pamala Duffel, RN Outcome: Progressing   Problem: Self-Care: Goal: Ability to participate in self-care as condition permits will improve 07/14/2023 0627 by Pamala Duffel, RN Outcome: Progressing 07/14/2023 0455 by Pamala Duffel, RN Outcome:  Progressing Goal: Verbalization of feelings and concerns over difficulty with self-care will improve 07/14/2023 0627 by Pamala Duffel, RN Outcome: Progressing 07/14/2023 0455 by Pamala Duffel, RN Outcome: Progressing Goal: Ability to communicate needs accurately will improve 07/14/2023 0627 by Pamala Duffel, RN Outcome: Progressing 07/14/2023 0455 by Pamala Duffel, RN Outcome: Progressing   Problem: Nutrition: Goal: Risk of aspiration will decrease 07/14/2023 0627 by Pamala Duffel, RN Outcome: Progressing 07/14/2023 0455 by Pamala Duffel, RN Outcome: Progressing Goal: Dietary intake will improve 07/14/2023 0627 by Pamala Duffel, RN Outcome: Progressing 07/14/2023 0455 by Pamala Duffel, RN Outcome: Progressing   Problem: Education: Goal: Knowledge of General Education information will improve Description: Including pain rating scale, medication(s)/side effects and non-pharmacologic comfort measures 07/14/2023 0627 by Pamala Duffel, RN Outcome: Progressing 07/14/2023 0455 by Pamala Duffel, RN Outcome: Progressing   Problem: Health Behavior/Discharge Planning: Goal: Ability to manage health-related needs will improve 07/14/2023 0627 by Pamala Duffel, RN Outcome: Progressing 07/14/2023 0455 by Pamala Duffel, RN Outcome: Progressing   Problem: Clinical Measurements: Goal: Ability to maintain clinical measurements within normal limits will improve 07/14/2023 0627 by Pamala Duffel, RN Outcome: Progressing 07/14/2023 0455 by Pamala Duffel, RN Outcome: Progressing Goal: Will remain free from infection 07/14/2023 0627 by Pamala Duffel, RN Outcome: Progressing 07/14/2023 0455 by Pamala Duffel, RN Outcome: Progressing Goal: Diagnostic test results will improve 07/14/2023 0627 by Pamala Duffel, RN Outcome: Progressing 07/14/2023 0455 by Pamala Duffel, RN Outcome:  Progressing Goal: Respiratory complications will improve 07/14/2023 0627 by Pamala Duffel, RN Outcome: Progressing 07/14/2023 0455 by Pamala Duffel, RN Outcome: Progressing Goal: Cardiovascular complication will be avoided 07/14/2023 0627 by Pamala Duffel, RN Outcome: Progressing 07/14/2023 0455 by Pamala Duffel, RN Outcome: Progressing   Problem: Activity: Goal: Risk for activity intolerance will decrease 07/14/2023 0627 by Pamala Duffel, RN Outcome: Progressing 07/14/2023 0455 by  Pamala Duffel, RN Outcome: Progressing   Problem: Nutrition: Goal: Adequate nutrition will be maintained 07/14/2023 0627 by Pamala Duffel, RN Outcome: Progressing 07/14/2023 0455 by Pamala Duffel, RN Outcome: Progressing   Problem: Coping: Goal: Level of anxiety will decrease 07/14/2023 0627 by Pamala Duffel, RN Outcome: Progressing 07/14/2023 0455 by Pamala Duffel, RN Outcome: Progressing   Problem: Elimination: Goal: Will not experience complications related to bowel motility 07/14/2023 0627 by Pamala Duffel, RN Outcome: Progressing 07/14/2023 0455 by Pamala Duffel, RN Outcome: Progressing Goal: Will not experience complications related to urinary retention 07/14/2023 0627 by Pamala Duffel, RN Outcome: Progressing 07/14/2023 0455 by Pamala Duffel, RN Outcome: Progressing   Problem: Pain Managment: Goal: General experience of comfort will improve and/or be controlled 07/14/2023 0627 by Pamala Duffel, RN Outcome: Progressing 07/14/2023 0455 by Pamala Duffel, RN Outcome: Progressing   Problem: Safety: Goal: Ability to remain free from injury will improve 07/14/2023 0627 by Pamala Duffel, RN Outcome: Progressing 07/14/2023 0455 by Pamala Duffel, RN Outcome: Progressing   Problem: Skin Integrity: Goal: Risk for impaired skin integrity will decrease 07/14/2023 0627 by Pamala Duffel, RN Outcome: Progressing 07/14/2023 0455 by Pamala Duffel, RN Outcome: Progressing   Problem: Education: Goal: Understanding of CV disease, CV risk reduction, and recovery process will improve 07/14/2023 0627 by Pamala Duffel, RN Outcome: Progressing 07/14/2023 0455 by Pamala Duffel, RN Outcome: Progressing Goal: Individualized Educational Video(s) 07/14/2023 0627 by Pamala Duffel, RN Outcome: Progressing 07/14/2023 0455 by Pamala Duffel, RN Outcome: Progressing   Problem: Activity: Goal: Ability to return to baseline activity level will improve 07/14/2023 0627 by Pamala Duffel, RN Outcome: Progressing 07/14/2023 0455 by Pamala Duffel, RN Outcome: Progressing   Problem: Cardiovascular: Goal: Ability to achieve and maintain adequate cardiovascular perfusion will improve 07/14/2023 0627 by Pamala Duffel, RN Outcome: Progressing 07/14/2023 0455 by Pamala Duffel, RN Outcome: Progressing Goal: Vascular access site(s) Level 0-1 will be maintained 07/14/2023 0627 by Pamala Duffel, RN Outcome: Progressing 07/14/2023 0455 by Pamala Duffel, RN Outcome: Progressing   Problem: Health Behavior/Discharge Planning: Goal: Ability to safely manage health-related needs after discharge will improve 07/14/2023 0627 by Pamala Duffel, RN Outcome: Progressing 07/14/2023 0455 by Pamala Duffel, RN Outcome: Progressing

## 2023-07-14 NOTE — Plan of Care (Signed)
  Problem: Education: Goal: Knowledge of disease or condition will improve Outcome: Progressing Goal: Knowledge of secondary prevention will improve (MUST DOCUMENT ALL) Outcome: Progressing Goal: Knowledge of patient specific risk factors will improve (DELETE if not current risk factor) Outcome: Progressing   Problem: Ischemic Stroke/TIA Tissue Perfusion: Goal: Complications of ischemic stroke/TIA will be minimized Outcome: Progressing   Problem: Coping: Goal: Will verbalize positive feelings about self Outcome: Progressing Goal: Will identify appropriate support needs Outcome: Progressing   Problem: Health Behavior/Discharge Planning: Goal: Ability to manage health-related needs will improve Outcome: Progressing Goal: Goals will be collaboratively established with patient/family Outcome: Progressing   Problem: Self-Care: Goal: Ability to participate in self-care as condition permits will improve Outcome: Progressing Goal: Verbalization of feelings and concerns over difficulty with self-care will improve Outcome: Progressing Goal: Ability to communicate needs accurately will improve Outcome: Progressing   Problem: Nutrition: Goal: Risk of aspiration will decrease Outcome: Progressing Goal: Dietary intake will improve Outcome: Progressing   Problem: Education: Goal: Knowledge of General Education information will improve Description: Including pain rating scale, medication(s)/side effects and non-pharmacologic comfort measures Outcome: Progressing   Problem: Health Behavior/Discharge Planning: Goal: Ability to manage health-related needs will improve Outcome: Progressing   Problem: Clinical Measurements: Goal: Ability to maintain clinical measurements within normal limits will improve Outcome: Progressing Goal: Will remain free from infection Outcome: Progressing Goal: Diagnostic test results will improve Outcome: Progressing Goal: Respiratory complications will  improve Outcome: Progressing Goal: Cardiovascular complication will be avoided Outcome: Progressing   Problem: Activity: Goal: Risk for activity intolerance will decrease Outcome: Progressing   Problem: Nutrition: Goal: Adequate nutrition will be maintained Outcome: Progressing   Problem: Coping: Goal: Level of anxiety will decrease Outcome: Progressing   Problem: Elimination: Goal: Will not experience complications related to bowel motility Outcome: Progressing Goal: Will not experience complications related to urinary retention Outcome: Progressing   Problem: Pain Managment: Goal: General experience of comfort will improve and/or be controlled Outcome: Progressing   Problem: Safety: Goal: Ability to remain free from injury will improve Outcome: Progressing   Problem: Skin Integrity: Goal: Risk for impaired skin integrity will decrease Outcome: Progressing   Problem: Education: Goal: Understanding of CV disease, CV risk reduction, and recovery process will improve Outcome: Progressing Goal: Individualized Educational Video(s) Outcome: Progressing   Problem: Activity: Goal: Ability to return to baseline activity level will improve Outcome: Progressing   Problem: Cardiovascular: Goal: Ability to achieve and maintain adequate cardiovascular perfusion will improve Outcome: Progressing Goal: Vascular access site(s) Level 0-1 will be maintained Outcome: Progressing   Problem: Health Behavior/Discharge Planning: Goal: Ability to safely manage health-related needs after discharge will improve Outcome: Progressing

## 2023-07-14 NOTE — Progress Notes (Signed)
 STROKE TEAM PROGRESS NOTE   SUBJECTIVE (INTERVAL HISTORY) Patient is seen in her room with no family at the bedside.  She has remained hemodynamically stable overnight, and hemoglobin this morning is 9.2   patient was found to have lice on her clothes when she was admitted and she likely has it at her house.  Home therapy will not visit her due to her home lice situation.  OBJECTIVE Temp:  [97.5 F (36.4 C)-98.4 F (36.9 C)] 97.8 F (36.6 C) (03/11 1200) Pulse Rate:  [48-87] 87 (03/11 1200) Cardiac Rhythm: Atrial fibrillation (03/11 0700) Resp:  [16-18] 18 (03/11 1200) BP: (120-168)/(61-117) 156/84 (03/11 1200) SpO2:  [94 %-97 %] 96 % (03/11 1200)  Recent Labs  Lab 07/11/23 2106 07/11/23 2308 07/12/23 0607 07/12/23 1110 07/12/23 1550  GLUCAP 103* 99 96 106* 101*   Recent Labs  Lab 07/09/23 1213 07/10/23 0528 07/11/23 0624 07/12/23 1454 07/13/23 0636 07/14/23 0847  NA  --  136 135 135 138 137  K  --  3.5 4.3 4.2 3.7 4.0  CL  --  107 106 103 107 105  CO2  --  23 23 25 25 24   GLUCOSE  --  122* 97 117* 96 92  BUN  --  9 13 10 9 9   CREATININE  --  0.89 0.97 0.88 1.01* 0.95  CALCIUM  --  8.3* 8.3* 8.9 8.6* 9.2  MG 1.6* 1.7  --   --   --   --   PHOS 3.1 3.5  --   --   --   --    Recent Labs  Lab 07/09/23 0124  AST 19  ALT 12  ALKPHOS 61  BILITOT 1.0  PROT 5.8*  ALBUMIN 3.1*   Recent Labs  Lab 07/09/23 0124 07/09/23 0227 07/10/23 0528 07/11/23 0624 07/12/23 1454 07/13/23 0636 07/14/23 0847  WBC 7.0  --  6.7 6.3 5.5 4.2 4.9  NEUTROABS 5.8  --   --   --   --   --   --   HGB 6.4*   < > 7.6* 6.9* 8.6* 7.6* 9.2*  HCT 21.7*   < > 24.4* 22.5* 27.5* 24.0* 29.4*  MCV 72.6*  --  73.9* 74.8* 77.0* 76.9* 78.0*  PLT 368  --  328 297 344 305 385   < > = values in this interval not displayed.   No results for input(s): "CKTOTAL", "CKMB", "CKMBINDEX", "TROPONINI" in the last 168 hours. No results for input(s): "LABPROT", "INR" in the last 72 hours.  No results for  input(s): "COLORURINE", "LABSPEC", "PHURINE", "GLUCOSEU", "HGBUR", "BILIRUBINUR", "KETONESUR", "PROTEINUR", "UROBILINOGEN", "NITRITE", "LEUKOCYTESUR" in the last 72 hours.  Invalid input(s): "APPERANCEUR"     Component Value Date/Time   CHOL 128 07/09/2023 0124   TRIG 59 07/09/2023 0124   HDL 48 07/09/2023 0124   CHOLHDL 2.7 07/09/2023 0124   VLDL 12 07/09/2023 0124   LDLCALC 68 07/09/2023 0124   Lab Results  Component Value Date   HGBA1C 5.7 (H) 07/09/2023   No results found for: "LABOPIA", "COCAINSCRNUR", "LABBENZ", "AMPHETMU", "THCU", "LABBARB"  Recent Labs  Lab 07/09/23 0124  ETH <10    I have personally reviewed the radiological images below and agree with the radiology interpretations.  IR PERCUTANEOUS ART THROMBECTOMY/INFUSION INTRACRANIAL INC DIAG ANGIO Result Date: 07/10/2023 INDICATION: New onset aphasia and right-sided weakness. 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 EXAM: 1. EMERGENT LARGE VESSEL OCCLUSION THROMBOLYSIS (anterior CIRCULATION) COMPARISON:  CT angiogram of the head and neck July 08, 2023. MEDICATIONS: No antibiotic was administered within 1 hour of the procedure. ANESTHESIA/SEDATION: General anesthesia. CONTRAST:  Omnipaque 300 approximately 75 cc. FLUOROSCOPY TIME:  Fluoroscopy Time: 9 minutes 54 seconds (812 mGy). COMPLICATIONS: None immediate. TECHNIQUE: Following a full explanation of the procedure along with the potential associated complications, an informed witnessed consent was obtained. The risks of intracranial hemorrhage of 10%, worsening neurological deficit, ventilator dependency, death and inability to revascularize were all reviewed in detail with the patient's son. The patient was then put under general anesthesia by the Department of Anesthesiology at [facility]. The right groin was prepped and draped in the usual sterile fashion. Thereafter using modified Seldinger  technique, transfemoral access into the right common femoral artery was obtained without difficulty. Over an 0.035 inch guidewire an 8 French 25 cm Pinnacle sheath was inserted. Through this, and also over an 0.035 inch guidewire a combination of an 088 100 cm Zoom support catheter with a 125 cm 6 French Simmons 2 support catheter was advanced to the aortic arch region, and selectively positioned in the left common carotid artery, and the distal left internal carotid artery. FINDINGS: The left common carotid arteriogram demonstrates the left external carotid artery and its major branches to be widely patent. The left internal carotid to the skull base is widely patent with a moderate to severe tortuosity of the mid cervical ICA. The petrous, the cavernous and the supraclinoid left ICA are widely patent with mild stenosis in the caval segment. A left posterior communicating artery is seen opacifying the left posterior cerebral artery distribution. The left anterior cerebral artery opacifies into the capillary and venous phases. The left middle cerebral artery demonstrates a stable moderate stenosis in the mid M1 segment with near complete occlusion distally at the level of the trifurcation branches. PROCEDURE: Through the 088 support catheter in the distal cervical ICA, a combination of an 062 Penumbra aspiration catheter with an 043 distal aspiration catheter was advanced over an 035 inch colossal guidewire to the supraclinoid left ICA. The wire was then advanced through the occluded stented segment of the middle cerebral artery into the distal stented segment in the superior division followed by advancement of the 043 and the 062 aspiration catheters into the occluded stent. The wire was removed. Aspiration was then applied for about 30 seconds at the hub of the 043 aspiration catheter, with a Penumbra aspiration pump at the hub of the 062 aspiration catheter for approximately 2 minutes. Following retrieval of the  two aspiration catheters, a control arteriogram performed through the 088 support catheter demonstrated complete revascularization of the left middle cerebral artery distribution achieving a TICI 3 revascularization. Also noted was patency of the stented segment and also of the proximal left middle cerebral artery M1 segment significantly improved compared to the pre treatment caliber. Control arteriogram was then performed at 10, 20 and 35 minutes post revascularization. These continued to demonstrate improved caliber of the left middle cerebral artery distribution. The patient was given a total of 4.5 mg of intra arterial Integrilin in order to prevent platelet aggregation within the stent. A final control arteriogram performed through the 088 support catheter in the left common carotid artery demonstrates the left internal carotid artery to be widely patent to the cranial skull base with no change in the tortuosity. Intracranially, the left middle cerebral artery and the left anterior cerebral artery distributions continued to demonstrate wide patency and TICI 3 revascularization. The 088  support catheter was removed. An 8 French Angio-Seal closure device was used for hemostasis at the right groin puncture site. Distal pulses remained present bilaterally unchanged from prior to the procedure. A flat panel CT of the brain demonstrated no evidence of hemorrhagic complications. The patient was extubated. Upon recovery, the patient followed simple commands intermittently appropriately. Movement was noted in the right upper and right lower extremity. Patient was then transferred to the neuro ICU for post revascularization care. IMPRESSION: Status post complete revascularization of near complete occlusion of the left middle cerebral artery stented segment with 1 pass with a combination of 062 and 043 aspiration catheters achieving a TICI 3 revascularization. PLAN: As per referring MD. Electronically Signed   By:  Julieanne Cotton M.D.   On: 07/10/2023 08:50   IR CT Head Ltd Result Date: 07/10/2023 INDICATION: New onset aphasia and right-sided weakness. 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 EXAM: 1. EMERGENT LARGE VESSEL OCCLUSION THROMBOLYSIS (anterior CIRCULATION) COMPARISON:  CT angiogram of the head and neck July 08, 2023. MEDICATIONS: No antibiotic was administered within 1 hour of the procedure. ANESTHESIA/SEDATION: General anesthesia. CONTRAST:  Omnipaque 300 approximately 75 cc. FLUOROSCOPY TIME:  Fluoroscopy Time: 9 minutes 54 seconds (812 mGy). COMPLICATIONS: None immediate. TECHNIQUE: Following a full explanation of the procedure along with the potential associated complications, an informed witnessed consent was obtained. The risks of intracranial hemorrhage of 10%, worsening neurological deficit, ventilator dependency, death and inability to revascularize were all reviewed in detail with the patient's son. The patient was then put under general anesthesia by the Department of Anesthesiology at [facility]. The right groin was prepped and draped in the usual sterile fashion. Thereafter using modified Seldinger technique, transfemoral access into the right common femoral artery was obtained without difficulty. Over an 0.035 inch guidewire an 8 French 25 cm Pinnacle sheath was inserted. Through this, and also over an 0.035 inch guidewire a combination of an 088 100 cm Zoom support catheter with a 125 cm 6 French Simmons 2 support catheter was advanced to the aortic arch region, and selectively positioned in the left common carotid artery, and the distal left internal carotid artery. FINDINGS: The left common carotid arteriogram demonstrates the left external carotid artery and its major branches to be widely patent. The left internal carotid to the skull base is widely patent with a moderate to severe tortuosity of the mid cervical ICA.  The petrous, the cavernous and the supraclinoid left ICA are widely patent with mild stenosis in the caval segment. A left posterior communicating artery is seen opacifying the left posterior cerebral artery distribution. The left anterior cerebral artery opacifies into the capillary and venous phases. The left middle cerebral artery demonstrates a stable moderate stenosis in the mid M1 segment with near complete occlusion distally at the level of the trifurcation branches. PROCEDURE: Through the 088 support catheter in the distal cervical ICA, a combination of an 062 Penumbra aspiration catheter with an 043 distal aspiration catheter was advanced over an 035 inch colossal guidewire to the supraclinoid left ICA. The wire was then advanced through the occluded stented segment of the middle cerebral artery into the distal stented segment in the superior division followed by advancement of the 043 and the 062 aspiration catheters into the occluded stent. The wire was removed. Aspiration was then applied for about 30 seconds at the hub of the 043 aspiration catheter, with a Penumbra aspiration pump at the hub  of the 062 aspiration catheter for approximately 2 minutes. Following retrieval of the two aspiration catheters, a control arteriogram performed through the 088 support catheter demonstrated complete revascularization of the left middle cerebral artery distribution achieving a TICI 3 revascularization. Also noted was patency of the stented segment and also of the proximal left middle cerebral artery M1 segment significantly improved compared to the pre treatment caliber. Control arteriogram was then performed at 10, 20 and 35 minutes post revascularization. These continued to demonstrate improved caliber of the left middle cerebral artery distribution. The patient was given a total of 4.5 mg of intra arterial Integrilin in order to prevent platelet aggregation within the stent. A final control arteriogram performed  through the 088 support catheter in the left common carotid artery demonstrates the left internal carotid artery to be widely patent to the cranial skull base with no change in the tortuosity. Intracranially, the left middle cerebral artery and the left anterior cerebral artery distributions continued to demonstrate wide patency and TICI 3 revascularization. The 088 support catheter was removed. An 8 French Angio-Seal closure device was used for hemostasis at the right groin puncture site. Distal pulses remained present bilaterally unchanged from prior to the procedure. A flat panel CT of the brain demonstrated no evidence of hemorrhagic complications. The patient was extubated. Upon recovery, the patient followed simple commands intermittently appropriately. Movement was noted in the right upper and right lower extremity. Patient was then transferred to the neuro ICU for post revascularization care. IMPRESSION: Status post complete revascularization of near complete occlusion of the left middle cerebral artery stented segment with 1 pass with a combination of 062 and 043 aspiration catheters achieving a TICI 3 revascularization. PLAN: As per referring MD. Electronically Signed   By: Julieanne Cotton M.D.   On: 07/10/2023 08:50   ECHOCARDIOGRAM COMPLETE Result Date: 07/09/2023    ECHOCARDIOGRAM REPORT   Patient Name:   Melissa [name] Date of Exam: 07/09/2023 Medical Rec #:  [id number]        Height:       62.0 in Accession #:    [id number]       Weight:       153.9 lb Date of Birth:  [birth year]        BSA:          1.710 m Patient Age:    77 years         BP:           112/88 mmHg Patient Gender: F                HR:           57 bpm. Exam Location:  Inpatient Procedure: 2D Echo, Cardiac Doppler and Color Doppler (Both Spectral and Color            Flow Doppler were utilized during procedure). Indications:    Stroke I63.9  History:        Patient has prior history of Echocardiogram examinations, most                  recent 06/29/2023. CHF, CAD, Stroke, CKD 3 and COPD; Risk                 Factors:Dyslipidemia and Non-Smoker.  Sonographer:    Dondra Prader RVT RCS Referring Phys: [id number] Lynnae January IMPRESSIONS  1. Left ventricular ejection fraction, by estimation, is 65 to 70%. The left ventricle has normal function. The left ventricle has  no regional wall motion abnormalities. Left ventricular diastolic parameters were normal.  2. Right ventricular systolic function is normal. The right ventricular size is normal.  3. Left atrial size was severely dilated.  4. The mitral valve is normal in structure. Mild mitral valve regurgitation.  5. The aortic valve is tricuspid. Aortic valve regurgitation is not visualized. Aortic valve sclerosis/calcification is present, without any evidence of aortic stenosis. Comparison(s): The left ventricular function is unchanged. FINDINGS  Left Ventricle: Left ventricular ejection fraction, by estimation, is 65 to 70%. The left ventricle has normal function. The left ventricle has no regional wall motion abnormalities. The left ventricular internal cavity size was normal in size. There is  no left ventricular hypertrophy. Left ventricular diastolic parameters were normal. Right Ventricle: The right ventricular size is normal. Right vetricular wall thickness was not assessed. Right ventricular systolic function is normal. Left Atrium: Left atrial size was severely dilated. Right Atrium: Right atrial size was normal in size. Pericardium: Trivial pericardial effusion is present. Mitral Valve: The mitral valve is normal in structure. Mild mitral valve regurgitation. Tricuspid Valve: The tricuspid valve is normal in structure. Tricuspid valve regurgitation is mild. Aortic Valve: The aortic valve is tricuspid. Aortic valve regurgitation is not visualized. Aortic valve sclerosis/calcification is present, without any evidence of aortic stenosis. Aortic valve mean gradient measures 6.0 mmHg. Aortic valve  peak gradient measures 11.1 mmHg. Aortic valve area, by VTI measures 2.30 cm. Pulmonic Valve: The pulmonic valve was normal in structure. Pulmonic valve regurgitation is not visualized. Aorta: The aortic root and ascending aorta are structurally normal, with no evidence of dilitation. IAS/Shunts: No atrial level shunt detected by color flow Doppler.  LEFT VENTRICLE PLAX 2D LVIDd:         4.40 cm   Diastology LVIDs:         2.80 cm   LV e' medial:    8.11 cm/s LV PW:         1.20 cm   LV E/e' medial:  20.4 LV IVS:        0.90 cm   LV e' lateral:   7.26 cm/s LVOT diam:     2.00 cm   LV E/e' lateral: 22.8 LV SV:         85 LV SV Index:   50 LVOT Area:     3.14 cm  RIGHT VENTRICLE             IVC RV Basal diam:  3.75 cm     IVC diam: 2.90 cm RV Mid diam:    2.70 cm RV S prime:     11.35 cm/s TAPSE (M-mode): 1.7 cm LEFT ATRIUM             Index        RIGHT ATRIUM           Index LA diam:        3.90 cm 2.28 cm/m   RA Area:     20.30 cm LA Vol (A2C):   89.9 ml 52.57 ml/m  RA Volume:   61.60 ml  36.02 ml/m LA Vol (A4C):   92.0 ml 53.80 ml/m LA Biplane Vol: 94.9 ml 55.49 ml/m  AORTIC VALVE                     PULMONIC VALVE AV Area (Vmax):    2.28 cm      PV Vmax:       0.96 m/s AV Area (Vmean):  2.34 cm      PV Peak grad:  3.7 mmHg AV Area (VTI):     2.30 cm AV Vmax:           166.33 cm/s AV Vmean:          111.667 cm/s AV VTI:            0.372 m AV Peak Grad:      11.1 mmHg AV Mean Grad:      6.0 mmHg LVOT Vmax:         120.75 cm/s LVOT Vmean:        83.350 cm/s LVOT VTI:          0.272 m LVOT/AV VTI ratio: 0.73  AORTA Ao Root diam: 2.70 cm Ao Asc diam:  3.20 cm MITRAL VALVE                TRICUSPID VALVE MV Area (PHT): 3.42 cm     TR Peak grad:   44.9 mmHg MV Decel Time: 222 msec     TR Vmax:        335.00 cm/s MV E velocity: 165.67 cm/s MV A velocity: 58.90 cm/s   SHUNTS MV E/A ratio:  2.81         Systemic VTI:  0.27 m                             Systemic Diam: 2.00 cm Dietrich Pates MD Electronically signed  by Dietrich Pates MD Signature Date/Time: 07/09/2023/2:00:23 PM    Final    CT HEAD WO CONTRAST ( ) Result Date: 07/09/2023 CLINICAL DATA:  78 year old female recurrent code stroke presentation on 07/08/2023, left MCA M1 stent thrombus. Status post endovascular revascularization. Punctate left hemisphere infarcts on post treatment MRI. EXAM: CT HEAD WITHOUT CONTRAST TECHNIQUE: Contiguous axial images were obtained from the base of the skull through the vertex without intravenous contrast. RADIATION DOSE REDUCTION: This exam was performed according to the departmental dose-optimization program which includes automated exposure control, adjustment of the mA and/or kV according to patient size and/or use of iterative reconstruction technique. COMPARISON:  Brain MRI yesterday and earlier. FINDINGS: Brain: No acute intracranial hemorrhage identified. No midline shift, mass effect, or evidence of intracranial mass lesion. No ventriculomegaly. Gray-white differentiation appears stable by CT since presentation yesterday with chronic ischemic changes in the left MCA territory, confluent chronic cerebral white matter disease. Subtle diffusion abnormality by MRI is not correlated. And no new cortically based infarct is identified. Vascular: Extensive Calcified atherosclerosis at the skull base. Left MCA stent redemonstrated. Skull: Stable, intact. Sinuses/Orbits: Left nasoenteric tube now in place. Visualized paranasal sinuses and mastoids are stable and well aerated. Other: No acute orbit or scalp soft tissue finding. IMPRESSION: No acute intracranial hemorrhage and subtle Left MCA territory ischemia on MRI is occult by CT. Stable chronic white matter and ischemic disease. No new intracranial abnormality. Electronically Signed   By: Odessa Fleming M.D.   On: 07/09/2023 06:28   DG Abd 1 View Result Date: 07/09/2023 CLINICAL DATA:  [id number].  Encounter for orogastric tube placement. EXAM: ABDOMEN - 1 VIEW COMPARISON:  Chest, abdomen and  pelvis CT 06/29/2023 FINDINGS: There is a large hiatal hernia with about half of the stomach intrathoracic. NGT is in place and coiled within the herniated portion of the stomach. There is mild dilatation of some of the left abdominal small bowel segments up to 3.3 cm which could be due  to ileus or partial small bowel obstruction. Gas and stool are present in the colon at least to the proximal descending segment. There are cholecystectomy clips. No supine evidence of free air. Lung bases are clear. Cardiomegaly. IMPRESSION: 1. Large hiatal hernia with about half of the stomach intrathoracic. 2. NGT is in place and coiled within the herniated portion of the stomach. 3. Mild dilatation of some of the left abdominal small bowel segments up to 3.3 cm which could be due to ileus or partial small bowel obstruction. Electronically Signed   By: Almira Bar M.D.   On: 07/09/2023 06:12   MR BRAIN WO CONTRAST Result Date: 07/09/2023 CLINICAL DATA:  Stroke, follow up EXAM: MRI HEAD WITHOUT CONTRAST TECHNIQUE: Multiplanar, multiecho pulse sequences of the brain and surrounding structures were obtained without intravenous contrast. COMPARISON:  MRI head May 21 24. FINDINGS: Brain: Small acute infarct in the left insula and a few punctate acute infarcts in the left frontal lobe. No significant mass effect. Remote left basal ganglia infarcts. Patchy additional T2/FLAIR hyperintensities in the white matter, compatible with chronic microvascular ischemic change. No midline shift. No hydrocephalus. No mass lesion. Vascular: Major arterial flow voids are maintained at the skull base. Stented left MCA. Skull and upper cervical spine: Normal marrow signal. Sinuses/Orbits: Clear sinuses.  No acute orbital findings. Other: No mastoid effusions. IMPRESSION: Small acute infarct in the left insula and a few punctate acute infarcts in the left frontal lobe Electronically Signed   By: Feliberto Harts M.D.   On: 07/09/2023 01:53   CT  ANGIO HEAD NECK W WO CM W PERF (CODE STROKE) Result Date: 07/08/2023 CLINICAL DATA:  Provided history: Neuro deficit, acute, stroke suspected. Slurred speech. Right-sided weakness. Confusion. Unable to follow commands. EXAM: CT ANGIOGRAPHY HEAD AND NECK CT PERFUSION BRAIN TECHNIQUE: Multidetector CT imaging of the head and neck was performed using the standard protocol during bolus administration of intravenous contrast. Multiplanar CT image reconstructions and MIPs were obtained to evaluate the vascular anatomy. Carotid stenosis measurements (when applicable) are obtained utilizing NASCET criteria, using the distal internal carotid diameter as the denominator. Multiphase CT imaging of the brain was performed following IV bolus contrast injection. Subsequent parametric perfusion maps were calculated using RAPID software. RADIATION DOSE REDUCTION: This exam was performed according to the departmental dose-optimization program which includes automated exposure control, adjustment of the mA and/or kV according to patient size and/or use of iterative reconstruction technique. CONTRAST:  OMNIPAQUE IOHEXOL 350 MG/ML SOLN COMPARISON:  Non-contrast head CT performed earlier today 07/08/2023. CTA head/neck 09/19/2022. Brain MRI 09/23/2022. CT chest/abdomen/pelvis 06/29/2023. FINDINGS: CTA NECK FINDINGS Aortic arch: Standard aortic branching. Atherosclerotic plaque within the visualized thoracic aorta and proximal major branch vessels of the neck. No hemodynamically significant innominate or proximal subclavian artery stenosis. Right carotid system: CCA and ICA patent within the neck without hemodynamically significant stenosis (50% or greater). Atherosclerotic plaque at the CCA origin and about the carotid bifurcation. Left carotid system: CCA and ICA patent within the neck without hemodynamically significant stenosis (50% or greater). Atherosclerotic plaque at the CCA origin, scattered elsewhere within the CCA and  about the carotid bifurcation. Tortuosity of the cervical ICA. Mild nonspecific fusiform dilation of the mid-to-distal cervical ICA, unchanged from the prior CTA of 09/19/2022. Vertebral arteries: The vertebral arteries are patent within the neck without stenosis or significant atherosclerotic disease. The left vertebral artery is slightly dominant. Skeleton: Spondylosis of the cervical and visualized upper thoracic levels. The patient is edentulous. Other neck:  No neck mass or cervical lymphadenopathy. Upper chest: 14 mm part-solid right upper lobe pulmonary nodule as was demonstrated on the recent prior CT chest/abdomen/pelvis of 09/19/2022 (series 7, image 391). Review of the MIP images confirms the above findings CTA HEAD FINDINGS Anterior circulation: The intracranial internal carotid arteries are patent. Atherosclerotic plaque within both vessels with no more than mild stenosis. A vascular stent is present within the M1 and M2 left middle cerebral artery. There is a subocclusive filling defect within the stented portion of the distal M1 left middle cerebral artery consistent with thrombus (for instance as seen on series 9, image 166) (series 7, image 132). This results in a severe stenosis. No left M2 proximal branch occlusion is identified. Severe stenosis within an M3 left MCA vessel (series 13, image 29). The right middle cerebral artery M1 segment is patent. No right M2 proximal branch occlusion or high-grade proximal arterial stenosis. The anterior cerebral arteries are patent. 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 Posterior circulation: The intracranial vertebral arteries are patent. Mild atherosclerotic irregularity of the V4 right vertebral artery. The basilar artery is patent. The posterior cerebral arteries are patent. A left posterior  communicating artery is present. The right posterior communicating artery is diminutive or absent. Venous sinuses: Within the limitations of contrast timing, no convincing thrombus. Anatomic variants: As described. Review of the MIP images confirms the above findings CT Brain Perfusion Findings: CBF (<30%) Volume: 8mL Perfusion (Tmax>6.0s) volume: 56mL Mismatch Volume: 48mL Infarction Location:Left MCA vascular territory. CTA head impressions #1 and #2 These results were called by telephone at the time of interpretation on 07/08/2023 at 6:41 pm to provider [facility] , who verbally acknowledged these results. IMPRESSION: CTA neck: 1. The common carotid and internal carotid arteries are patent within the neck without hemodynamically significant stenosis. Atherosclerotic plaque bilaterally, as described. 2. Vertebral arteries patent within the neck without stenosis or significant atherosclerotic disease. 3. Aortic Atherosclerosis (ICD10-I70.0). 4. 14 mm part-solid right upper lobe pulmonary nodule as was demonstrated on the recent prior CT chest/abdomen/pelvis of 06/29/2023 (series 7, image 391). Consider one of the following: a follow-up non-contrast chest CT 3 months from the previous examination of 06/29/2023, a follow-up PET-CT or tissue sampling. This recommendation follows the consensus statement: Guidelines for Management of Incidental Pulmonary Nodules Detected on CT Images: From the Fleischner Society 2017; Radiology 2017; 284:228-243. CTA head: 1. Subocclusive filling defect (consistent with thrombus) within the stented portion of the left middle cerebral artery M1 segment. Resultant severe stenosis at this site. 2. Severe stenosis within a left middle cerebral artery M3 segment vessel. 3. Background intracranial sclerotic disease as described. 4. 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 CT perfusion head: The  perfusion software identifies an 8 mL core infarct within the left MCA vascular territory. The perfusion software identifies a 56 mL region of critically hypoperfused parenchyma within the left MCA vascular territory (utilizing the Tmax>6 seconds threshold.). Reported mismatch volume: 48 mL. Electronically Signed   By: Jackey Loge D.O.   On: 07/08/2023 19:18   CT HEAD CODE STROKE WO CONTRAST Result Date: 07/08/2023 CLINICAL DATA:  Code stroke. Provided history: Slurred speech. Right-sided weakness. Confusion. Inability to follow commands. EXAM: CT HEAD WITHOUT CONTRAST TECHNIQUE: Contiguous axial images were obtained from the base of the skull  through the vertex without intravenous contrast. RADIATION DOSE REDUCTION: This exam was performed according to the departmental dose-optimization program which includes automated exposure control, adjustment of the mA and/or kV according to patient size and/or use of iterative reconstruction technique. COMPARISON:  Brain MRI 09/23/2022. FINDINGS: Brain: Generalized cerebral and cerebellar atrophy. Chronic infarcts within the left basal ganglia. Small chronic cortical infarct within the mid left frontal lobe (series 6, image 22). Background moderate patchy and ill-defined hypoattenuation within the cerebral white matter, nonspecific but compatible with chronic small vessel ischemic disease. There is no acute intracranial hemorrhage. No acute demarcated cortical infarct. No extra-axial fluid collection. No evidence of an intracranial mass. No midline shift. Vascular: Atherosclerotic calcifications. Vascular stent along the course of the left middle cerebral artery M1 and proximal M2 segments. No hyperdense vessel identified elsewhere. Skull: No calvarial fracture or aggressive osseous lesion. Sinuses/Orbits: No mass or acute finding within the imaged orbits. Minimal mucosal thickening within bilateral ethmoid air cells. ASPECTS [facility] Stroke Program Early CT Score) -  Ganglionic level infarction (caudate, lentiform nuclei, internal capsule, insula, M1-M3 cortex): 7 - Supraganglionic infarction (M4-M6 cortex): 3 Total score (0-10 with 10 being normal): 10 (when discounting chronic infarcts). No evidence of an acute intracranial abnormality. These results were communicated to Dr. Amada Jupiter at 6:33 pmon 3/5/2025by text page via the [facility] messaging system. IMPRESSION: 1.  No evidence of an acute intracranial abnormality. 2. Parenchymal atrophy, chronic small vessel ischemic disease and chronic infarcts, as described. 3. Vascular stent along the course of the left middle cerebral artery M1 and M2 segments. Electronically Signed   By: Jackey Loge D.O.   On: 07/08/2023 18:37     PHYSICAL EXAM  Temp:  [97.5 F (36.4 C)-98.4 F (36.9 C)] 97.8 F (36.6 C) (03/11 1200) Pulse Rate:  [48-87] 87 (03/11 1200) Resp:  [16-18] 18 (03/11 1200) BP: (120-168)/(61-117) 156/84 (03/11 1200) SpO2:  [94 %-97 %] 96 % (03/11 1200)  General - Well nourished, well developed, in no apparent distress.   Cardiovascular - irregularly irregular heart rate and rhythm.  Neuro - awake, alert, eyes open, orientated to age, place, time. No aphasia, paucity of language, following all simple commands.  Voice is not mildly dysarthric. no gaze palsy, tracking bilaterally, visual field full, PERRL. No facial droop. Tongue midline. Bilateral UEs 4/5, no drift. Bilaterally LEs 4/5, no drift. Sensation symmetrical bilaterally, b/l FTN intact grossly, gait not tested.    ASSESSMENT/PLAN Melissa [name] is a 78 y.o. female with history of A-fib on Eliquis, hypertension, hyperlipidemia, diabetes, migraine, OSA not on CPAP, dementia, CHF, GBS, GI bleeding, stroke status post stenting on Brilinta admitted for right-sided weakness, slurred speech, right facial droop and aphasia. No TNK given due to on Eliquis.    Stroke:  left subinsular patchy and punctate left frontal infarcts, embolic likely  secondary to A-fib not compliant with Eliquis CT no acute finding CT head and neck left M1 stent distal subocclusive occlusion.  Left M3 severe stenosis.  Bilateral ICA siphon atherosclerosis right more than left Status post IR with TICI3 MRI left insular cortex patchy infarct, punctate left frontal infarct CT repeat no acute finding 2D Echo EF 65 to 70% LDL 68 HgbA1c 5.7 SCDs for VTE prophylaxis Brilinta (ticagrelor) 90 mg bid and Eliquis (apixaban) daily (but per pharmacy, no refill record for either of these medications) prior to admission, now on aspirin 81 mg daily.  Eliquis restarted today, will continue to monitor hemoglobin and discontinue if hemoglobin drops again.  Patient counseled to be compliant with her antithrombotic medications Ongoing aggressive stroke risk factor management Therapy recommendations: Pending Disposition: Pending  Severe anemia Right groin hemorrhage at incision site Patient denies any leg pain, abdominal pain Examination soft nontender abdomen Hemoglobin 8.5--6.4--2 x PRBC--7.6--7.6 -- 6.9 -1 u PRBCs Close CBC monitoring Low threshold for CT abdomen to rule out retroperitoneal hemorrhage Eliquis restarted, will monitor hemoglobin tomorrow morning and discontinue if hemoglobin drops  History of stroke 09/2022 left MCA infarct with left M1 occlusion, status post IR with TICI3 and left MCA stenting.  EF 60 to 65%.  LDL 120, A1c 5.5.  Discharged on Brilinta and Eliquis and Crestor 40.  A-fib Rate controlled Home Eliquis but noncompliant per pharmacy record Continue Eliquis as inpatient  Diabetes HgbA1c 5.7 goal < 7.0 Controlled CBG monitoring SSI DM education and close PCP follow up  Hypertension Stable on the high end Resume home losartan 50 BP goal less than 180/105 now Long-term BP goal normotensive  Hyperlipidemia Home meds: Crestor 40 LDL 68, goal < 70 Now on Crestor 40 Continue statin at discharge  Dysphagia Did not pass  swallow Has NG tube On tube feeding  Other Stroke Risk Factors Advanced age Migraines Obstructive sleep apnea  Other Active Problems History of GBS Dementia History of GI bleeding  Hospital day # 6 Continue Eliquis and follow hematocrit closely.  Patient lives at home with her son who has epilepsy and I am not sure she will have those support that she needs also due to having lice in her home which may not be as safe environment for her to go back to hence will consult case manager to look at home situation and decide if she is safe to go home or needs skilled nursing facility.  Greater than 50% time during this 35-minute visit were spent in counseling and coordination of care and discussion with patient and care team and answering questions.  Delia Heady, MD Medical Director [facility] Stroke Center Pager: [phone] 07/14/2023 2:46 PM   To contact Stroke Continuity provider, please refer to [email or web address]. After hours, contact General Neurology

## 2023-07-15 ENCOUNTER — Other Ambulatory Visit (HOSPITAL_COMMUNITY): Payer: Self-pay

## 2023-07-15 DIAGNOSIS — I63512 Cerebral infarction due to unspecified occlusion or stenosis of left middle cerebral artery: Secondary | ICD-10-CM | POA: Diagnosis not present

## 2023-07-15 DIAGNOSIS — F430 Acute stress reaction: Secondary | ICD-10-CM

## 2023-07-15 LAB — BASIC METABOLIC PANEL
Anion gap: 4 — ABNORMAL LOW (ref 5–15)
BUN: 10 mg/dL (ref 8–23)
CO2: 27 mmol/L (ref 22–32)
Calcium: 8.8 mg/dL — ABNORMAL LOW (ref 8.9–10.3)
Chloride: 103 mmol/L (ref 98–111)
Creatinine, Ser: 1.02 mg/dL — ABNORMAL HIGH (ref 0.44–1.00)
GFR, Estimated: 56 mL/min — ABNORMAL LOW (ref >60)
Glucose, Bld: 90 mg/dL (ref 70–99)
Potassium: 3.7 mmol/L (ref 3.5–5.1)
Sodium: 134 mmol/L — ABNORMAL LOW (ref 135–145)

## 2023-07-15 LAB — CBC
HCT: 28.4 % — ABNORMAL LOW (ref 36.0–46.0)
Hemoglobin: 8.7 g/dL — ABNORMAL LOW (ref 12.0–15.0)
MCH: 23.8 pg — ABNORMAL LOW (ref 26.0–34.0)
MCHC: 30.6 g/dL (ref 30.0–36.0)
MCV: 77.6 fL — ABNORMAL LOW (ref 80.0–100.0)
Platelets: 397 10*3/uL (ref 150–400)
RBC: 3.66 MIL/uL — ABNORMAL LOW (ref 3.87–5.11)
RDW: 19.8 % — ABNORMAL HIGH (ref 11.5–15.5)
WBC: 5.4 10*3/uL (ref 4.0–10.5)
nRBC: 0 % (ref 0.0–0.2)

## 2023-07-15 MED ORDER — AMLODIPINE BESYLATE 5 MG PO TABS
5.0000 mg | ORAL_TABLET | Freq: Every day | ORAL | Status: DC
  Administered 2023-07-15: 5 mg via ORAL
  Filled 2023-07-15: qty 1

## 2023-07-15 MED ORDER — ONDANSETRON HCL 4 MG/2ML IJ SOLN
INTRAMUSCULAR | Status: AC
Start: 2023-07-15 — End: 2023-07-15
  Filled 2023-07-15: qty 2

## 2023-07-15 MED ORDER — ENSURE ENLIVE PO LIQD
237.0000 mL | Freq: Two times a day (BID) | ORAL | 12 refills | Status: AC
  Filled 2023-07-15: qty 237, 1d supply, fill #0

## 2023-07-15 MED ORDER — SODIUM CHLORIDE 0.9 % IV SOLN
12.5000 mg | Freq: Once | INTRAVENOUS | Status: DC
  Filled 2023-07-15: qty 0.5

## 2023-07-15 MED ORDER — AMLODIPINE BESYLATE 5 MG PO TABS
5.0000 mg | ORAL_TABLET | Freq: Every day | ORAL | 1 refills | Status: DC
  Filled 2023-07-15: qty 30, 30d supply, fill #0

## 2023-07-15 MED ORDER — ONDANSETRON HCL 4 MG/2ML IJ SOLN
4.0000 mg | Freq: Four times a day (QID) | INTRAMUSCULAR | Status: DC | PRN
  Administered 2023-07-15: 4 mg via INTRAVENOUS

## 2023-07-15 NOTE — Consult Note (Signed)
 [facility] Psychiatric Consult Initial  Patient Name: .Melissa [name]  MRN: [id number]  DOB: June 24, 1945  Consult Order details:  Orders (From admission, onward)     Start     Ordered   07/15/23 1121  IP CONSULT TO PSYCHIATRY       Ordering Provider: Elmer Picker, NP  Provider:  (Not yet assigned)  Question Answer Comment  Location [facility]   Reason for Consult? Suicidal ideation      07/15/23 1120             Mode of Visit: In person    Psychiatry Consult Evaluation  Service Date: July 15, 2023 LOS:  LOS: 7 days  Chief Complaint "I'm better now".  Primary Psychiatric Diagnoses  Stress reaction with mixed disturbance of emotion and conduct   Assessment  Melissa [name] is a 78 y.o. female admitted: Medicallyfor 07/08/2023  6:05 PM with past medical history significant for left MCA infarct May 2024, Afib, DM, HLD, HTN, migraines, sleep apnea, anxiety/depression, dementia, COPD, CHF, fibromyalgia, GERD, Guillain Teola Bradley who presents to the code stroke due to slurred speech and right-sided weakness. . She carries the psychiatric diagnoses of GAD, past history of depression.  Her current presentation of getting upset about discharging and expressing suicidal ideations in the moment is most consistent with a stress reaction with mixed disturbance of emotion and conduct.   Current outpatient psychotropic medications include Zoloft and historically she has had a positive response to these medications. She was compliant with medications prior to admission as evidenced by self-report. On initial examination, patient she was calmly eating her lunch, no distress noted. Please see plan below for detailed recommendations.   The RN went to discharge her and she got upset.  She voiced she wanted to kill herself by overdosing but then regretted saying this.  Psych consult placed and she stated she was better.  She attributed her flippant comment to not wanting to leave the  hospital, no suicidal ideation on assessment.  No past suicide attempts or psych admissions.  She helps manage an adult son at home with IDD.  Her brother and family are involved with her care, support system nearby.  Denied depression, low anxiety only related to leaving the hospital.  No psychosis or substance abuse.  Discussed her discharging today and she seemed agreeable with it if she couldn't get "another day or two".  Diagnoses:  Active Hospital problems: Principal Problem:   Acute ischemic left MCA stroke (HCC) Active Problems:   Middle cerebral artery stenosis, left   Stress reaction causing mixed disturbance of emotion and conduct    Plan   ## Psychiatric Medication Recommendations:  Continue Zoloft 25 mg daily Follow up with outpatient provider  ## Medical Decision Making Capacity: Not specifically addressed in this encounter  ## Disposition:-- There are no psychiatric contraindications to discharge at this time  ## Behavioral / Environmental: - No specific recommendations at this time.     ## Safety and Observation Level:  - Based on my clinical evaluation, I estimate the patient to be at low risk of self harm in the current setting. - At this time, we recommend  routine. This decision is based on my review of the chart including patient's history and current presentation, interview of the patient, mental status examination, and consideration of suicide risk including evaluating suicidal ideation, plan, intent, suicidal or self-harm behaviors, risk factors, and protective factors. This judgment is based on our ability to directly  address suicide risk, implement suicide prevention strategies, and develop a safety plan while the patient is in the clinical setting. Please contact our team if there is a concern that risk level has changed.  CSSR Risk Category:C-SSRS RISK CATEGORY: High Risk  Suicide Risk Assessment: Patient has following modifiable risk factors are  none. Patient has following non-modifiable or demographic risk factors for suicide: None Patient has the following protective factors against suicide: Access to outpatient mental health care, Supportive family, Cultural, spiritual, or religious beliefs that discourage suicide, no history of suicide attempts, and no history of NSSIB  Thank you for this consult request. Recommendations have been communicated to the primary team.  We will sing off at this time.   Nanine Means, NP       History of Present Illness  Relevant Aspects of [facility] Course:  Admitted on 07/08/2023 for a code stroke due to slurred speech and right-sided weakness.  They are medically stable and discharging home today.   Patient Report:  "I'm better now".  She said she got upset earlier and regretted saying she wanted to end her life as she doesn't want to do that.  Denied depression, some anxiety related to going home, "Can't I stay another day", despite not having pain or other issues.  She does care for adult son who is "mentally slow" per notes.  Psych ROS:  Depression: denied Anxiety:  mild related to leaving the hospital Mania (lifetime and current): none Psychosis: (lifetime and current): none  Review of Systems  Psychiatric/Behavioral:  The patient is nervous/anxious.   All other systems reviewed and are negative.    Psychiatric and Social History  Psychiatric History:  Information collected from patient and chart.  Prev Dx/Sx: depression, anxiety Home Meds (current): Zoloft  Prior Psych Hospitalization: none  Prior Self Harm: none Prior Violence: none  Family Psych History: none Family Hx suicide: none  Social History:  Occupational Hx: retired Armed forces operational officer Hx: none Living Situation: lives at home and manages an adult son Spiritual Hx: Christian Access to weapons/lethal means: None   Substance History Denied substance use  Exam Findings  Physical Exam:  Vital Signs:  Temp:  [97.5 F  (36.4 C)-98.6 F (37 C)] 97.5 F (36.4 C) (03/12 1223) Pulse Rate:  [54-74] 64 (03/12 1223) Resp:  [18-19] 19 (03/12 1223) BP: (138-193)/(62-150) 138/72 (03/12 1223) SpO2:  [94 %-100 %] 96 % (03/12 1223) Blood pressure 138/72, pulse 64, temperature (!) 97.5 F (36.4 C), temperature source Axillary, resp. rate 19, height 5\' 2"  (1.575 m), weight 69.5 kg, SpO2 96%. Body mass index is 28.02 kg/m.  Physical Exam Vitals and nursing note reviewed.  Constitutional:      Appearance: Normal appearance.  HENT:     Head: Normocephalic.     Nose: Nose normal.  Pulmonary:     Effort: Pulmonary effort is normal.  Musculoskeletal:     Cervical back: Normal range of motion.  Neurological:     General: No focal deficit present.     Mental Status: She is alert and oriented to person, place, and time.     Mental Status Exam: General Appearance: Casual  Orientation:  Full (Time, Place, and Person)  Memory:  Immediate;   Good Recent;   Good Remote;   Good  Concentration:  Concentration: Good and Attention Span: Good  Recall:  Good  Attention  Good  Eye Contact:  Good  Speech:  Normal Rate  Language:  Good  Volume:  Normal  Mood:  anxiety about leaving the hospital  Affect:  Congruent  Thought Process:  Coherent  Thought Content:  Logical  Suicidal Thoughts:  No  Homicidal Thoughts:  No  Judgement:  Fair  Insight:  Good  Psychomotor Activity:  Normal  Akathisia:  No  Fund of Knowledge:  Good      Assets:  Housing Leisure Time Resilience Social Support  Cognition:  WNL  ADL's:  Intact  AIMS (if indicated):        Other History   These have been pulled in through the EMR, reviewed, and updated if appropriate.  Family History:  The patient's family history is not on file.  Medical History: Past Medical History:  Diagnosis Date   Anemia    Anxiety    Arthritis    "all over" (12/08/2017)   Asthma    CHF (congestive heart failure) (HCC)    Chronic lower back pain     COPD (chronic obstructive pulmonary disease) (HCC)    Dementia (HCC)    Depression    Diabetes mellitus without complication (HCC)    Fibromyalgia    Gastric ulcer    GERD (gastroesophageal reflux disease)    Guillain Barr syndrome (HCC)    Hematemesis 12/07/2017   History of blood transfusion    "low blood" (12/08/2017)   History of stomach ulcers    Hypercholesterolemia    Hypertension    Migraine    "used to have them; don't have them anymore" (12/08/2017)   Pneumonia    "probably twice" (12/08/2017)   Sleep apnea    "couldn't take the mask" (12/08/2017)   Stroke (HCC)    "I don't remember if I have had one or not; I believe I have" (12/08/2017)    Surgical History: Past Surgical History:  Procedure Laterality Date   ABDOMINAL HERNIA REPAIR     ABDOMINAL HYSTERECTOMY     APPENDECTOMY     BIOPSY  12/08/2017   Procedure: BIOPSY;  Surgeon: Lynann Bologna, MD;  Location: [facility] ENDOSCOPY;  Service: Endoscopy;;   BREAST SURGERY Left    "tumors removed; not cancer"   BUNIONECTOMY Bilateral    CARPAL TUNNEL RELEASE Bilateral    CATARACT EXTRACTION, BILATERAL Bilateral    CHOLECYSTECTOMY     COLONOSCOPY  03/03/2011   Mild sigmoid diverticulosis. Internal hemorrhoids.    DILATION AND CURETTAGE OF UTERUS     ELBOW FRACTURE SURGERY Left    ESOPHAGOGASTRODUODENOSCOPY N/A 12/08/2017   Procedure: ESOPHAGOGASTRODUODENOSCOPY (EGD);  Surgeon: Lynann Bologna, MD;  Location: [facility] ENDOSCOPY;  Service: Endoscopy;  Laterality: N/A;   ESOPHAGOGASTRODUODENOSCOPY ENDOSCOPY  12/08/2017   w/bx   FRACTURE SURGERY     HERNIA REPAIR     HIP FRACTURE SURGERY Left    IR CT HEAD LTD  09/20/2022   IR CT HEAD LTD  09/20/2022   IR CT HEAD LTD  07/08/2023   IR PERCUTANEOUS ART THROMBECTOMY/INFUSION INTRACRANIAL INC DIAG ANGIO  09/20/2022   IR PERCUTANEOUS ART THROMBECTOMY/INFUSION INTRACRANIAL INC DIAG ANGIO  07/08/2023   KNEE ARTHROSCOPY Right    RADIOLOGY WITH ANESTHESIA N/A 09/20/2022   Procedure: IR WITH ANESTHESIA;   Surgeon: Julieanne Cotton, MD;  Location: [facility] OR;  Service: Radiology;  Laterality: N/A;   RADIOLOGY WITH ANESTHESIA N/A 07/08/2023   Procedure: RADIOLOGY WITH ANESTHESIA;  Surgeon: Radiologist, Medication, MD;  Location: [facility] OR;  Service: Radiology;  Laterality: N/A;   SHOULDER OPEN ROTATOR CUFF REPAIR Bilateral    TONSILLECTOMY       Medications:   Current Facility-Administered  Medications:    0.9 %  sodium chloride infusion (Manually program via Guardrails IV Fluids), , Intravenous, Once, Caryl Pina, MD   acetaminophen (TYLENOL) tablet 650 mg, 650 mg, Oral, Q4H PRN **OR** acetaminophen (TYLENOL) 160 MG/5ML solution 650 mg, 650 mg, Per Tube, Q4H PRN **OR** acetaminophen (TYLENOL) suppository 650 mg, 650 mg, Rectal, Q4H PRN, Deveshwar, Sanjeev, MD   amLODipine (NORVASC) tablet 5 mg, 5 mg, Oral, Daily, Shafer, Devon, NP, 5 mg at 07/15/23 0931   apixaban (ELIQUIS) tablet 5 mg, 5 mg, Oral, BID, Pearlean Brownie, Pramod S, MD, 5 mg at 07/15/23 0931   feeding supplement (ENSURE ENLIVE / ENSURE PLUS) liquid 237 mL, 237 mL, Oral, BID BM, Pearlean Brownie, Pramod S, MD, 237 mL at 07/15/23 0931   losartan (COZAAR) tablet 50 mg, 50 mg, Oral, Daily, Marvel Plan, MD, 50 mg at 07/15/23 0931   ondansetron (ZOFRAN) injection 4 mg, 4 mg, Intravenous, Q6H PRN, Elmer Picker, NP, 4 mg at 07/15/23 1016   Oral care mouth rinse, 15 mL, Mouth Rinse, PRN, Caryl Pina, MD   promethazine (PHENERGAN) 12.5 mg in sodium chloride 0.9 % 50 mL IVPB, 12.5 mg, Intravenous, Once, Pamalee Leyden, Devon, NP   rosuvastatin (CRESTOR) tablet 40 mg, 40 mg, Oral, Daily, Jacklynn Barnacle, RPH, 40 mg at 07/15/23 0931   senna-docusate (Senokot-S) tablet 1 tablet, 1 tablet, Oral, QHS PRN, Hetty Blend C, NP   sertraline (ZOLOFT) tablet 25 mg, 25 mg, Oral, Daily, Marvel Plan, MD, 25 mg at 07/15/23 0930   sodium chloride flush (NS) 0.9 % injection 3 mL, 3 mL, Intravenous, Once, Lonell Grandchild, MD  Allergies: Allergies  Allergen Reactions   Nsaids Other  (See Comments)    CAN TAKE ONLY TYLENOL- NOTHING ELSE!!!!!!   Codeine Nausea And Vomiting and Other (See Comments)    GI Intolerance   Lyrica [Pregabalin] Other (See Comments)    Reaction not known at this time   Penicillins Other (See Comments)    Childhood allergy- exact reaction not noted    Nanine Means, NP

## 2023-07-15 NOTE — Progress Notes (Deleted)
 STROKE TEAM PROGRESS NOTE   SUBJECTIVE (INTERVAL HISTORY) Patient is seen in her room with no family at the bedside.  She has remained hemodynamically stable overnight, and hemoglobin this morning is 8.7 from 9.2. Eliquis has been resumed. Unable to have home health due to bed bugs and lice. When told discharge plan patient began endorsing suicidal thoughts this morning - precautions in place, psych consult ordered.   OBJECTIVE Temp:  [97.8 F (36.6 C)-98.6 F (37 C)] 98.6 F (37 C) (03/12 0830) Pulse Rate:  [54-87] 54 (03/12 0830) Cardiac Rhythm: Atrial fibrillation (03/12 0758) Resp:  [18] 18 (03/12 0830) BP: (153-193)/(67-150) 153/75 (03/12 0830) SpO2:  [94 %-100 %] 94 % (03/12 0830)  Recent Labs  Lab 07/11/23 2106 07/11/23 2308 07/12/23 0607 07/12/23 1110 07/12/23 1550  GLUCAP 103* 99 96 106* 101*   Recent Labs  Lab 07/09/23 1213 07/10/23 0528 07/11/23 0624 07/12/23 1454 07/13/23 0636 07/14/23 0847  NA  --  136 135 135 138 137  K  --  3.5 4.3 4.2 3.7 4.0  CL  --  107 106 103 107 105  CO2  --  23 23 25 25 24   GLUCOSE  --  122* 97 117* 96 92  BUN  --  9 13 10 9 9   CREATININE  --  0.89 0.97 0.88 1.01* 0.95  CALCIUM  --  8.3* 8.3* 8.9 8.6* 9.2  MG 1.6* 1.7  --   --   --   --   PHOS 3.1 3.5  --   --   --   --    Recent Labs  Lab 07/09/23 0124  AST 19  ALT 12  ALKPHOS 61  BILITOT 1.0  PROT 5.8*  ALBUMIN 3.1*   Recent Labs  Lab 07/09/23 0124 07/09/23 0227 07/11/23 0624 07/12/23 1454 07/13/23 0636 07/14/23 0847 07/15/23 0846  WBC 7.0   < > 6.3 5.5 4.2 4.9 5.4  NEUTROABS 5.8  --   --   --   --   --   --   HGB 6.4*   < > 6.9* 8.6* 7.6* 9.2* 8.7*  HCT 21.7*   < > 22.5* 27.5* 24.0* 29.4* 28.4*  MCV 72.6*   < > 74.8* 77.0* 76.9* 78.0* 77.6*  PLT 368   < > 297 344 305 385 397   < > = values in this interval not displayed.   No results for input(s): "CKTOTAL", "CKMB", "CKMBINDEX", "TROPONINI" in the last 168 hours. No results for input(s): "LABPROT", "INR"  in the last 72 hours.  No results for input(s): "COLORURINE", "LABSPEC", "PHURINE", "GLUCOSEU", "HGBUR", "BILIRUBINUR", "KETONESUR", "PROTEINUR", "UROBILINOGEN", "NITRITE", "LEUKOCYTESUR" in the last 72 hours.  Invalid input(s): "APPERANCEUR"     Component Value Date/Time   CHOL 128 07/09/2023 0124   TRIG 59 07/09/2023 0124   HDL 48 07/09/2023 0124   CHOLHDL 2.7 07/09/2023 0124   VLDL 12 07/09/2023 0124   LDLCALC 68 07/09/2023 0124   Lab Results  Component Value Date   HGBA1C 5.7 (H) 07/09/2023   No results found for: "LABOPIA", "COCAINSCRNUR", "LABBENZ", "AMPHETMU", "THCU", "LABBARB"  Recent Labs  Lab 07/09/23 0124  ETH <10    I have personally reviewed the radiological images below and agree with the radiology interpretations.  IR PERCUTANEOUS ART THROMBECTOMY/INFUSION INTRACRANIAL INC DIAG ANGIO Result Date: 07/10/2023 INDICATION: New onset aphasia and right-sided weakness. 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 EXAM: 1. EMERGENT LARGE VESSEL OCCLUSION THROMBOLYSIS (  anterior CIRCULATION) COMPARISON:  CT angiogram of the head and neck July 08, 2023. MEDICATIONS: No antibiotic was administered within 1 hour of the procedure. ANESTHESIA/SEDATION: General anesthesia. CONTRAST:  Omnipaque 300 approximately 75 cc. FLUOROSCOPY TIME:  Fluoroscopy Time: 9 minutes 54 seconds (812 mGy). COMPLICATIONS: None immediate. TECHNIQUE: Following a full explanation of the procedure along with the potential associated complications, an informed witnessed consent was obtained. The risks of intracranial hemorrhage of 10%, worsening neurological deficit, ventilator dependency, death and inability to revascularize were all reviewed in detail with the patient's son. The patient was then put under general anesthesia by the Department of Anesthesiology at [facility]. The right groin was prepped and draped in the usual sterile fashion.  Thereafter using modified Seldinger technique, transfemoral access into the right common femoral artery was obtained without difficulty. Over an 0.035 inch guidewire an 8 French 25 cm Pinnacle sheath was inserted. Through this, and also over an 0.035 inch guidewire a combination of an 088 100 cm Zoom support catheter with a 125 cm 6 French Simmons 2 support catheter was advanced to the aortic arch region, and selectively positioned in the left common carotid artery, and the distal left internal carotid artery. FINDINGS: The left common carotid arteriogram demonstrates the left external carotid artery and its major branches to be widely patent. The left internal carotid to the skull base is widely patent with a moderate to severe tortuosity of the mid cervical ICA. The petrous, the cavernous and the supraclinoid left ICA are widely patent with mild stenosis in the caval segment. A left posterior communicating artery is seen opacifying the left posterior cerebral artery distribution. The left anterior cerebral artery opacifies into the capillary and venous phases. The left middle cerebral artery demonstrates a stable moderate stenosis in the mid M1 segment with near complete occlusion distally at the level of the trifurcation branches. PROCEDURE: Through the 088 support catheter in the distal cervical ICA, a combination of an 062 Penumbra aspiration catheter with an 043 distal aspiration catheter was advanced over an 035 inch colossal guidewire to the supraclinoid left ICA. The wire was then advanced through the occluded stented segment of the middle cerebral artery into the distal stented segment in the superior division followed by advancement of the 043 and the 062 aspiration catheters into the occluded stent. The wire was removed. Aspiration was then applied for about 30 seconds at the hub of the 043 aspiration catheter, with a Penumbra aspiration pump at the hub of the 062 aspiration catheter for approximately 2  minutes. Following retrieval of the two aspiration catheters, a control arteriogram performed through the 088 support catheter demonstrated complete revascularization of the left middle cerebral artery distribution achieving a TICI 3 revascularization. Also noted was patency of the stented segment and also of the proximal left middle cerebral artery M1 segment significantly improved compared to the pre treatment caliber. Control arteriogram was then performed at 10, 20 and 35 minutes post revascularization. These continued to demonstrate improved caliber of the left middle cerebral artery distribution. The patient was given a total of 4.5 mg of intra arterial Integrilin in order to prevent platelet aggregation within the stent. A final control arteriogram performed through the 088 support catheter in the left common carotid artery demonstrates the left internal carotid artery to be widely patent to the cranial skull base with no change in the tortuosity. Intracranially, the left middle cerebral artery and the left anterior cerebral artery distributions continued to demonstrate wide patency and TICI  3 revascularization. The 088 support catheter was removed. An 8 French Angio-Seal closure device was used for hemostasis at the right groin puncture site. Distal pulses remained present bilaterally unchanged from prior to the procedure. A flat panel CT of the brain demonstrated no evidence of hemorrhagic complications. The patient was extubated. Upon recovery, the patient followed simple commands intermittently appropriately. Movement was noted in the right upper and right lower extremity. Patient was then transferred to the neuro ICU for post revascularization care. IMPRESSION: Status post complete revascularization of near complete occlusion of the left middle cerebral artery stented segment with 1 pass with a combination of 062 and 043 aspiration catheters achieving a TICI 3 revascularization. PLAN: As per referring  [name]. Electronically Signed   By: Julieanne Cotton M.D.   On: 07/10/2023 08:50   IR CT Head Ltd Result Date: 07/10/2023 INDICATION: New onset aphasia and right-sided weakness. 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 EXAM: 1. EMERGENT LARGE VESSEL OCCLUSION THROMBOLYSIS (anterior CIRCULATION) COMPARISON:  CT angiogram of the head and neck July 08, 2023. MEDICATIONS: No antibiotic was administered within 1 hour of the procedure. ANESTHESIA/SEDATION: General anesthesia. CONTRAST:  Omnipaque 300 approximately 75 cc. FLUOROSCOPY TIME:  Fluoroscopy Time: 9 minutes 54 seconds (812 mGy). COMPLICATIONS: None immediate. TECHNIQUE: Following a full explanation of the procedure along with the potential associated complications, an informed witnessed consent was obtained. The risks of intracranial hemorrhage of 10%, worsening neurological deficit, ventilator dependency, death and inability to revascularize were all reviewed in detail with the patient's son. The patient was then put under general anesthesia by the Department of Anesthesiology at [facility]. The right groin was prepped and draped in the usual sterile fashion. Thereafter using modified Seldinger technique, transfemoral access into the right common femoral artery was obtained without difficulty. Over an 0.035 inch guidewire an 8 French 25 cm Pinnacle sheath was inserted. Through this, and also over an 0.035 inch guidewire a combination of an 088 100 cm Zoom support catheter with a 125 cm 6 French Simmons 2 support catheter was advanced to the aortic arch region, and selectively positioned in the left common carotid artery, and the distal left internal carotid artery. FINDINGS: The left common carotid arteriogram demonstrates the left external carotid artery and its major branches to be widely patent. The left internal carotid to the skull base is widely patent with a moderate to severe  tortuosity of the mid cervical ICA. The petrous, the cavernous and the supraclinoid left ICA are widely patent with mild stenosis in the caval segment. A left posterior communicating artery is seen opacifying the left posterior cerebral artery distribution. The left anterior cerebral artery opacifies into the capillary and venous phases. The left middle cerebral artery demonstrates a stable moderate stenosis in the mid M1 segment with near complete occlusion distally at the level of the trifurcation branches. PROCEDURE: Through the 088 support catheter in the distal cervical ICA, a combination of an 062 Penumbra aspiration catheter with an 043 distal aspiration catheter was advanced over an 035 inch colossal guidewire to the supraclinoid left ICA. The wire was then advanced through the occluded stented segment of the middle cerebral artery into the distal stented segment in the superior division followed by advancement of the 043 and the 062 aspiration catheters into the occluded stent. The wire was removed. Aspiration was then applied for about 30 seconds at the hub of the 043 aspiration catheter, with a Penumbra aspiration  pump at the hub of the 062 aspiration catheter for approximately 2 minutes. Following retrieval of the two aspiration catheters, a control arteriogram performed through the 088 support catheter demonstrated complete revascularization of the left middle cerebral artery distribution achieving a TICI 3 revascularization. Also noted was patency of the stented segment and also of the proximal left middle cerebral artery M1 segment significantly improved compared to the pre treatment caliber. Control arteriogram was then performed at 10, 20 and 35 minutes post revascularization. These continued to demonstrate improved caliber of the left middle cerebral artery distribution. The patient was given a total of 4.5 mg of intra arterial Integrilin in order to prevent platelet aggregation within the stent. A  final control arteriogram performed through the 088 support catheter in the left common carotid artery demonstrates the left internal carotid artery to be widely patent to the cranial skull base with no change in the tortuosity. Intracranially, the left middle cerebral artery and the left anterior cerebral artery distributions continued to demonstrate wide patency and TICI 3 revascularization. The 088 support catheter was removed. An 8 French Angio-Seal closure device was used for hemostasis at the right groin puncture site. Distal pulses remained present bilaterally unchanged from prior to the procedure. A flat panel CT of the brain demonstrated no evidence of hemorrhagic complications. The patient was extubated. Upon recovery, the patient followed simple commands intermittently appropriately. Movement was noted in the right upper and right lower extremity. Patient was then transferred to the neuro ICU for post revascularization care. IMPRESSION: Status post complete revascularization of near complete occlusion of the left middle cerebral artery stented segment with 1 pass with a combination of 062 and 043 aspiration catheters achieving a TICI 3 revascularization. PLAN: As per referring [name]. Electronically Signed   By: Julieanne Cotton M.D.   On: 07/10/2023 08:50   ECHOCARDIOGRAM COMPLETE Result Date: 07/09/2023    ECHOCARDIOGRAM REPORT   Patient Name:   Melissa [name] Date of Exam: 07/09/2023 Medical Rec #:  [id number]        Height:       62.0 in Accession #:    [id number]       Weight:       153.9 lb Date of Birth:  06/27/1945        BSA:          1.710 m Patient Age:    77 years         BP:           112/88 mmHg Patient Gender: F                HR:           57 bpm. Exam Location:  Inpatient Procedure: 2D Echo, Cardiac Doppler and Color Doppler (Both Spectral and Color            Flow Doppler were utilized during procedure). Indications:    Stroke I63.9  History:        Patient has prior history of  Echocardiogram examinations, most                 recent 06/29/2023. CHF, CAD, Stroke, CKD 3 and COPD; Risk                 Factors:Dyslipidemia and Non-Smoker.  Sonographer:    Dondra Prader RVT RCS Referring Phys: [id number] Lynnae January IMPRESSIONS  1. Left ventricular ejection fraction, by estimation, is 65 to 70%. The left ventricle has normal function.  The left ventricle has no regional wall motion abnormalities. Left ventricular diastolic parameters were normal.  2. Right ventricular systolic function is normal. The right ventricular size is normal.  3. Left atrial size was severely dilated.  4. The mitral valve is normal in structure. Mild mitral valve regurgitation.  5. The aortic valve is tricuspid. Aortic valve regurgitation is not visualized. Aortic valve sclerosis/calcification is present, without any evidence of aortic stenosis. Comparison(s): The left ventricular function is unchanged. FINDINGS  Left Ventricle: Left ventricular ejection fraction, by estimation, is 65 to 70%. The left ventricle has normal function. The left ventricle has no regional wall motion abnormalities. The left ventricular internal cavity size was normal in size. There is  no left ventricular hypertrophy. Left ventricular diastolic parameters were normal. Right Ventricle: The right ventricular size is normal. Right vetricular wall thickness was not assessed. Right ventricular systolic function is normal. Left Atrium: Left atrial size was severely dilated. Right Atrium: Right atrial size was normal in size. Pericardium: Trivial pericardial effusion is present. Mitral Valve: The mitral valve is normal in structure. Mild mitral valve regurgitation. Tricuspid Valve: The tricuspid valve is normal in structure. Tricuspid valve regurgitation is mild. Aortic Valve: The aortic valve is tricuspid. Aortic valve regurgitation is not visualized. Aortic valve sclerosis/calcification is present, without any evidence of aortic stenosis. Aortic valve  mean gradient measures 6.0 mmHg. Aortic valve peak gradient measures 11.1 mmHg. Aortic valve area, by VTI measures 2.30 cm. Pulmonic Valve: The pulmonic valve was normal in structure. Pulmonic valve regurgitation is not visualized. Aorta: The aortic root and ascending aorta are structurally normal, with no evidence of dilitation. IAS/Shunts: No atrial level shunt detected by color flow Doppler.  LEFT VENTRICLE PLAX 2D LVIDd:         4.40 cm   Diastology LVIDs:         2.80 cm   LV e' medial:    8.11 cm/s LV PW:         1.20 cm   LV E/e' medial:  20.4 LV IVS:        0.90 cm   LV e' lateral:   7.26 cm/s LVOT diam:     2.00 cm   LV E/e' lateral: 22.8 LV SV:         85 LV SV Index:   50 LVOT Area:     3.14 cm  RIGHT VENTRICLE             IVC RV Basal diam:  3.75 cm     IVC diam: 2.90 cm RV Mid diam:    2.70 cm RV S prime:     11.35 cm/s TAPSE (M-mode): 1.7 cm LEFT ATRIUM             Index        RIGHT ATRIUM           Index LA diam:        3.90 cm 2.28 cm/m   RA Area:     20.30 cm LA Vol (A2C):   89.9 ml 52.57 ml/m  RA Volume:   61.60 ml  36.02 ml/m LA Vol (A4C):   92.0 ml 53.80 ml/m LA Biplane Vol: 94.9 ml 55.49 ml/m  AORTIC VALVE                     PULMONIC VALVE AV Area (Vmax):    2.28 cm      PV Vmax:       0.96  m/s AV Area (Vmean):   2.34 cm      PV Peak grad:  3.7 mmHg AV Area (VTI):     2.30 cm AV Vmax:           166.33 cm/s AV Vmean:          111.667 cm/s AV VTI:            0.372 m AV Peak Grad:      11.1 mmHg AV Mean Grad:      6.0 mmHg LVOT Vmax:         120.75 cm/s LVOT Vmean:        83.350 cm/s LVOT VTI:          0.272 m LVOT/AV VTI ratio: 0.73  AORTA Ao Root diam: 2.70 cm Ao Asc diam:  3.20 cm MITRAL VALVE                TRICUSPID VALVE MV Area (PHT): 3.42 cm     TR Peak grad:   44.9 mmHg MV Decel Time: 222 msec     TR Vmax:        335.00 cm/s MV E velocity: 165.67 cm/s MV A velocity: 58.90 cm/s   SHUNTS MV E/A ratio:  2.81         Systemic VTI:  0.27 m                             Systemic  Diam: 2.00 cm Dietrich Pates [name] Electronically signed by Dietrich Pates [name] Signature Date/Time: 07/09/2023/2:00:23 PM    Final    CT HEAD WO CONTRAST ( ) Result Date: 07/09/2023 CLINICAL DATA:  78 year old female recurrent code stroke presentation on 07/08/2023, left MCA M1 stent thrombus. Status post endovascular revascularization. Punctate left hemisphere infarcts on post treatment MRI. EXAM: CT HEAD WITHOUT CONTRAST TECHNIQUE: Contiguous axial images were obtained from the base of the skull through the vertex without intravenous contrast. RADIATION DOSE REDUCTION: This exam was performed according to the departmental dose-optimization program which includes automated exposure control, adjustment of the mA and/or kV according to patient size and/or use of iterative reconstruction technique. COMPARISON:  Brain MRI yesterday and earlier. FINDINGS: Brain: No acute intracranial hemorrhage identified. No midline shift, mass effect, or evidence of intracranial mass lesion. No ventriculomegaly. Gray-white differentiation appears stable by CT since presentation yesterday with chronic ischemic changes in the left MCA territory, confluent chronic cerebral white matter disease. Subtle diffusion abnormality by MRI is not correlated. And no new cortically based infarct is identified. Vascular: Extensive Calcified atherosclerosis at the skull base. Left MCA stent redemonstrated. Skull: Stable, intact. Sinuses/Orbits: Left nasoenteric tube now in place. Visualized paranasal sinuses and mastoids are stable and well aerated. Other: No acute orbit or scalp soft tissue finding. IMPRESSION: No acute intracranial hemorrhage and subtle Left MCA territory ischemia on MRI is occult by CT. Stable chronic white matter and ischemic disease. No new intracranial abnormality. Electronically Signed   By: Odessa Fleming M.D.   On: 07/09/2023 06:28   DG Abd 1 View Result Date: 07/09/2023 CLINICAL DATA:  [id number].  Encounter for orogastric tube placement.  EXAM: ABDOMEN - 1 VIEW COMPARISON:  Chest, abdomen and pelvis CT 06/29/2023 FINDINGS: There is a large hiatal hernia with about half of the stomach intrathoracic. NGT is in place and coiled within the herniated portion of the stomach. There is mild dilatation of some of the left abdominal small bowel segments up to  3.3 cm which could be due to ileus or partial small bowel obstruction. Gas and stool are present in the colon at least to the proximal descending segment. There are cholecystectomy clips. No supine evidence of free air. Lung bases are clear. Cardiomegaly. IMPRESSION: 1. Large hiatal hernia with about half of the stomach intrathoracic. 2. NGT is in place and coiled within the herniated portion of the stomach. 3. Mild dilatation of some of the left abdominal small bowel segments up to 3.3 cm which could be due to ileus or partial small bowel obstruction. Electronically Signed   By: Almira Bar M.D.   On: 07/09/2023 06:12   MR BRAIN WO CONTRAST Result Date: 07/09/2023 CLINICAL DATA:  Stroke, follow up EXAM: MRI HEAD WITHOUT CONTRAST TECHNIQUE: Multiplanar, multiecho pulse sequences of the brain and surrounding structures were obtained without intravenous contrast. COMPARISON:  MRI head May 21 24. FINDINGS: Brain: Small acute infarct in the left insula and a few punctate acute infarcts in the left frontal lobe. No significant mass effect. Remote left basal ganglia infarcts. Patchy additional T2/FLAIR hyperintensities in the white matter, compatible with chronic microvascular ischemic change. No midline shift. No hydrocephalus. No mass lesion. Vascular: Major arterial flow voids are maintained at the skull base. Stented left MCA. Skull and upper cervical spine: Normal marrow signal. Sinuses/Orbits: Clear sinuses.  No acute orbital findings. Other: No mastoid effusions. IMPRESSION: Small acute infarct in the left insula and a few punctate acute infarcts in the left frontal lobe Electronically Signed   By:  Feliberto Harts M.D.   On: 07/09/2023 01:53   CT ANGIO HEAD NECK W WO CM W PERF (CODE STROKE) Result Date: 07/08/2023 CLINICAL DATA:  Provided history: Neuro deficit, acute, stroke suspected. Slurred speech. Right-sided weakness. Confusion. Unable to follow commands. EXAM: CT ANGIOGRAPHY HEAD AND NECK CT PERFUSION BRAIN TECHNIQUE: Multidetector CT imaging of the head and neck was performed using the standard protocol during bolus administration of intravenous contrast. Multiplanar CT image reconstructions and MIPs were obtained to evaluate the vascular anatomy. Carotid stenosis measurements (when applicable) are obtained utilizing NASCET criteria, using the distal internal carotid diameter as the denominator. Multiphase CT imaging of the brain was performed following IV bolus contrast injection. Subsequent parametric perfusion maps were calculated using RAPID software. RADIATION DOSE REDUCTION: This exam was performed according to the departmental dose-optimization program which includes automated exposure control, adjustment of the mA and/or kV according to patient size and/or use of iterative reconstruction technique. CONTRAST:  OMNIPAQUE IOHEXOL 350 MG/ML SOLN COMPARISON:  Non-contrast head CT performed earlier today 07/08/2023. CTA head/neck 09/19/2022. Brain MRI 09/23/2022. CT chest/abdomen/pelvis 06/29/2023. FINDINGS: CTA NECK FINDINGS Aortic arch: Standard aortic branching. Atherosclerotic plaque within the visualized thoracic aorta and proximal major branch vessels of the neck. No hemodynamically significant innominate or proximal subclavian artery stenosis. Right carotid system: CCA and ICA patent within the neck without hemodynamically significant stenosis (50% or greater). Atherosclerotic plaque at the CCA origin and about the carotid bifurcation. Left carotid system: CCA and ICA patent within the neck without hemodynamically significant stenosis (50% or greater). Atherosclerotic plaque at the  CCA origin, scattered elsewhere within the CCA and about the carotid bifurcation. Tortuosity of the cervical ICA. Mild nonspecific fusiform dilation of the mid-to-distal cervical ICA, unchanged from the prior CTA of 09/19/2022. Vertebral arteries: The vertebral arteries are patent within the neck without stenosis or significant atherosclerotic disease. The left vertebral artery is slightly dominant. Skeleton: Spondylosis of the cervical and visualized upper thoracic levels.  The patient is edentulous. Other neck: No neck mass or cervical lymphadenopathy. Upper chest: 14 mm part-solid right upper lobe pulmonary nodule as was demonstrated on the recent prior CT chest/abdomen/pelvis of 09/19/2022 (series 7, image 391). Review of the MIP images confirms the above findings CTA HEAD FINDINGS Anterior circulation: The intracranial internal carotid arteries are patent. Atherosclerotic plaque within both vessels with no more than mild stenosis. A vascular stent is present within the M1 and M2 left middle cerebral artery. There is a subocclusive filling defect within the stented portion of the distal M1 left middle cerebral artery consistent with thrombus (for instance as seen on series 9, image 166) (series 7, image 132). This results in a severe stenosis. No left M2 proximal branch occlusion is identified. Severe stenosis within an M3 left MCA vessel (series 13, image 29). The right middle cerebral artery M1 segment is patent. No right M2 proximal branch occlusion or high-grade proximal arterial stenosis. The anterior cerebral arteries are patent. 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 Posterior circulation: The intracranial vertebral arteries are patent. Mild atherosclerotic irregularity of the V4 right vertebral artery. The basilar artery is patent. The posterior  cerebral arteries are patent. A left posterior communicating artery is present. The right posterior communicating artery is diminutive or absent. Venous sinuses: Within the limitations of contrast timing, no convincing thrombus. Anatomic variants: As described. Review of the MIP images confirms the above findings CT Brain Perfusion Findings: CBF (<30%) Volume: 8mL Perfusion (Tmax>6.0s) volume: 56mL Mismatch Volume: 48mL Infarction Location:Left MCA vascular territory. CTA head impressions #1 and #2 These results were called by telephone at the time of interpretation on 07/08/2023 at 6:41 pm to provider [facility] , who verbally acknowledged these results. IMPRESSION: CTA neck: 1. The common carotid and internal carotid arteries are patent within the neck without hemodynamically significant stenosis. Atherosclerotic plaque bilaterally, as described. 2. Vertebral arteries patent within the neck without stenosis or significant atherosclerotic disease. 3. Aortic Atherosclerosis (ICD10-I70.0). 4. 14 mm part-solid right upper lobe pulmonary nodule as was demonstrated on the recent prior CT chest/abdomen/pelvis of 06/29/2023 (series 7, image 391). Consider one of the following: a follow-up non-contrast chest CT 3 months from the previous examination of 06/29/2023, a follow-up PET-CT or tissue sampling. This recommendation follows the consensus statement: Guidelines for Management of Incidental Pulmonary Nodules Detected on CT Images: From the Fleischner Society 2017; Radiology 2017; 284:228-243. CTA head: 1. Subocclusive filling defect (consistent with thrombus) within the stented portion of the left middle cerebral artery M1 segment. Resultant severe stenosis at this site. 2. Severe stenosis within a left middle cerebral artery M3 segment vessel. 3. Background intracranial sclerotic disease as described. 4. 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 CT perfusion head: The perfusion software identifies an 8 mL core infarct within the left MCA vascular territory. The perfusion software identifies a 56 mL region of critically hypoperfused parenchyma within the left MCA vascular territory (utilizing the Tmax>6 seconds threshold.). Reported mismatch volume: 48 mL. Electronically Signed   By: Jackey Loge D.O.   On: 07/08/2023 19:18   CT HEAD CODE STROKE WO CONTRAST Result Date: 07/08/2023 CLINICAL DATA:  Code stroke. Provided history: Slurred speech. Right-sided weakness. Confusion. Inability to follow commands. EXAM: CT HEAD WITHOUT CONTRAST TECHNIQUE: Contiguous axial images were obtained  from the base of the skull through the vertex without intravenous contrast. RADIATION DOSE REDUCTION: This exam was performed according to the departmental dose-optimization program which includes automated exposure control, adjustment of the mA and/or kV according to patient size and/or use of iterative reconstruction technique. COMPARISON:  Brain MRI 09/23/2022. FINDINGS: Brain: Generalized cerebral and cerebellar atrophy. Chronic infarcts within the left basal ganglia. Small chronic cortical infarct within the mid left frontal lobe (series 6, image 22). Background moderate patchy and ill-defined hypoattenuation within the cerebral white matter, nonspecific but compatible with chronic small vessel ischemic disease. There is no acute intracranial hemorrhage. No acute demarcated cortical infarct. No extra-axial fluid collection. No evidence of an intracranial mass. No midline shift. Vascular: Atherosclerotic calcifications. Vascular stent along the course of the left middle cerebral artery M1 and proximal M2 segments. No hyperdense vessel identified elsewhere. Skull: No calvarial fracture or aggressive osseous lesion. Sinuses/Orbits: No mass or acute finding within the imaged orbits. Minimal mucosal thickening within bilateral ethmoid air cells. ASPECTS  [facility] [place] Stroke Program Early CT Score) - Ganglionic level infarction (caudate, lentiform nuclei, internal capsule, insula, M1-M3 cortex): 7 - Supraganglionic infarction (M4-M6 cortex): 3 Total score (0-10 with 10 being normal): 10 (when discounting chronic infarcts). No evidence of an acute intracranial abnormality. These results were communicated to Dr. Amada Jupiter at 6:33 pmon 3/5/2025by text page via the [facility] messaging system. IMPRESSION: 1.  No evidence of an acute intracranial abnormality. 2. Parenchymal atrophy, chronic small vessel ischemic disease and chronic infarcts, as described. 3. Vascular stent along the course of the left middle cerebral artery M1 and M2 segments. Electronically Signed   By: Jackey Loge D.O.   On: 07/08/2023 18:37     PHYSICAL EXAM  Temp:  [97.8 F (36.6 C)-98.6 F (37 C)] 98.6 F (37 C) (03/12 0830) Pulse Rate:  [54-87] 54 (03/12 0830) Resp:  [18] 18 (03/12 0830) BP: (153-193)/(67-150) 153/75 (03/12 0830) SpO2:  [94 %-100 %] 94 % (03/12 0830)  General - Well nourished, well developed, in no apparent distress.   Cardiovascular - irregularly irregular heart rate and rhythm.  Neuro - awake, alert, eyes open, orientated to age, place, time. No aphasia, paucity of language, following all simple commands.  Voice is not mildly dysarthric. no gaze palsy, tracking bilaterally, visual field full, PERRL. No facial droop. Tongue midline. Bilateral UEs 4/5, no drift. Bilaterally LEs 4/5, no drift. Sensation symmetrical bilaterally, b/l FTN intact grossly, gait not tested.    ASSESSMENT/PLAN Ms. JULL HARRAL is a 78 y.o. female with history of A-fib on Eliquis, hypertension, hyperlipidemia, diabetes, migraine, OSA not on CPAP, dementia, CHF, GBS, GI bleeding, stroke status post stenting on Brilinta admitted for right-sided weakness, slurred speech, right facial droop and aphasia. No TNK given due to on Eliquis.    Stroke:  left subinsular patchy and punctate left  frontal infarcts, embolic likely secondary to A-fib not compliant with Eliquis CT no acute finding CT head and neck left M1 stent distal subocclusive occlusion.  Left M3 severe stenosis.  Bilateral ICA siphon atherosclerosis right more than left Status post IR with TICI3 MRI left insular cortex patchy infarct, punctate left frontal infarct CT repeat no acute finding 2D Echo EF 65 to 70% LDL 68 HgbA1c 5.7 SCDs for VTE prophylaxis Brilinta (ticagrelor) 90 mg bid and Eliquis (apixaban) daily (but per pharmacy, no refill record for either of these medications) prior to admission, now on eliquis. Patient counseled to be compliant with her antithrombotic medications Ongoing aggressive stroke  risk factor management Therapy recommendations: Pending Disposition: Pending  Severe anemia Right groin hemorrhage at incision site Patient denies any leg pain, abdominal pain Examination soft nontender abdomen Hemoglobin 8.5--6.4--2 x PRBC--7.6--7.6 -- 6.9 -1 u PRBCs -> 8.6- 7.6- 9.2 -8.7 Close CBC monitoring Low threshold for CT abdomen to rule out retroperitoneal hemorrhage Eliquis restarted, will monitor hemoglobin tomorrow morning and discontinue if hemoglobin drops  Nausea/vomiting PRN zofran ordered One time dose of phenergan  Suicidal ideation Endorsing suicidal thoughts and plan with RN Psych consult placed Precautions ordered  History of stroke 09/2022 left MCA infarct with left M1 occlusion, status post IR with TICI3 and left MCA stenting.  EF 60 to 65%.  LDL 120, A1c 5.5.  Discharged on Brilinta and Eliquis and Crestor 40.  A-fib Rate controlled Home Eliquis but noncompliant per pharmacy record Continue Eliquis as inpatient  Diabetes HgbA1c 5.7 goal < 7.0 Controlled CBG monitoring SSI DM education and close PCP follow up  Hypertension Stable on the high end Resume home losartan 50 BP goal less than 180/105 now Long-term BP goal normotensive  Hyperlipidemia Home meds:  Crestor 40 LDL 68, goal < 70 Now on Crestor 40 Continue statin at discharge  Dysphagia Did not pass swallow Has NG tube On tube feeding  Other Stroke Risk Factors Advanced age Migraines Obstructive sleep apnea  Other Active Problems History of GBS Dementia History of GI bleeding  Hospital day # 7  Patient seen and examined by NP/APP with [name]. [name] to update note as needed.   Elmer Picker, DNP, FNP-BC [facility] Pager: [phone]    To contact Stroke Continuity provider, please refer to [email or web address]. After hours, contact General Neurology

## 2023-07-15 NOTE — Discharge Summary (Addendum)
 Stroke Discharge Summary  Patient ID: Melissa [name]   MRN: [id number]      DOB: 08/31/1945  Date of Admission: 07/08/2023 Date of Discharge: 07/15/2023  Attending Physician:  Delia Heady MD Consultant(s):    psychiatry  Patient's PCP:  Anselmo Pickler, MD  DISCHARGE PRIMARY DIAGNOSIS: left subinsular patchy and punctate left frontal infarcts, embolic likely secondary to A-fib not compliant with Eliquis with left M1 stent subocclusive distal occlusion s/p mechanical thrombectomy with TICI 3 revascularization of left M1  Patient Active Problem List   Diagnosis Date Noted   Stress reaction causing mixed disturbance of emotion and conduct 07/15/2023   Acute ischemic left MCA stroke (HCC) 07/08/2023   Middle cerebral artery stenosis, 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   Stroke (cerebrum) (HCC) 09/20/2022   Middle cerebral artery embolism, 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   Acute blood loss anemia 12/07/2017   Dementia (HCC) 12/07/2017   CHF (congestive heart failure) (HCC) 12/07/2017   COPD (chronic obstructive pulmonary disease) (HCC) 12/07/2017   History of completed stroke 12/07/2017   Unspecified atrial fibrillation (HCC) 12/07/2017   CKD (chronic kidney disease), stage III (HCC) 12/07/2017   History of diet-controlled diabetes 12/07/2017   Chronic bilateral low back pain without sciatica 10/02/2016   Gastroesophageal reflux disease without esophagitis 08/26/2016   GAD (generalized anxiety disorder) 07/22/2016   Depression 03/19/2016   Insomnia 03/19/2016   Coronary artery disease involving native coronary artery of native heart without angina pectoris 10/05/2014   Mixed hyperlipidemia Severe anemia s/p transfusion of PRBCs Right groin hemorrhage at incision site Suicidal ideation-acute stress reaction cleared for discharge per psychiatry Dysphagia 10/05/2014    Allergies as of 07/15/2023       Reactions   Nsaids Other (See Comments)   CAN TAKE ONLY TYLENOL- NOTHING ELSE!!!!!!   Codeine Nausea  And Vomiting, Other (See Comments)   GI Intolerance   Lyrica [pregabalin] Other (See Comments)   Reaction not known at this time   Penicillins Other (See Comments)   Childhood allergy- exact reaction not noted        Medication List     STOP taking these medications    aspirin 81 MG chewable tablet   cefdinir 300 MG capsule Commonly known as: OMNICEF   ferrous sulfate 325 (65 FE) MG tablet   lisinopril 5 MG tablet Commonly known as: ZESTRIL   metroNIDAZOLE 500 MG tablet Commonly known as: FLAGYL   omeprazole 40 MG capsule Commonly known as: PRILOSEC   ticagrelor 90 MG Tabs tablet Commonly known as: BRILINTA   TYLENOL 500 MG tablet Generic drug: acetaminophen       TAKE these medications    albuterol 108 (90 Base) MCG/ACT inhaler Commonly known as: VENTOLIN HFA Inhale 2 puffs into the lungs every 6 (six) hours as needed for wheezing or shortness of breath.   amLODipine 5 MG tablet Commonly known as: NORVASC Take 1 tablet (5 mg total) by mouth daily. Start taking on: July 16, 2023   apixaban 5 MG Tabs tablet Commonly known as: ELIQUIS Take 1 tablet (5 mg total) by mouth 2 (two) times daily.   docusate sodium 100 MG capsule Commonly known as: COLACE Take 1 capsule (100 mg total) by mouth daily.   feeding supplement Liqd Take 237 mLs by mouth 2 (two) times daily between meals. Start taking on: July 16, 2023   hydrOXYzine 10 MG tablet Commonly known as: ATARAX Take 10 mg by mouth 2 (two) times daily.   losartan 50 MG  tablet Commonly known as: COZAAR Take 50 mg by mouth daily.   rosuvastatin 40 MG tablet Commonly known as: CRESTOR Take 1 tablet (40 mg total) by mouth daily.   sertraline 25 MG tablet Commonly known as: ZOLOFT Take 25 mg by mouth daily.        LABORATORY STUDIES CBC    Component Value Date/Time   WBC 5.4 07/15/2023 0846   RBC 3.66 (L) 07/15/2023 0846   HGB 8.7 (L) 07/15/2023 0846   HCT 28.4 (L) 07/15/2023 0846   PLT  397 07/15/2023 0846   MCV 77.6 (L) 07/15/2023 0846   MCH 23.8 (L) 07/15/2023 0846   MCHC 30.6 07/15/2023 0846   RDW 19.8 (H) 07/15/2023 0846   LYMPHSABS 0.5 (L) 07/09/2023 0124   MONOABS 0.5 07/09/2023 0124   EOSABS 0.1 07/09/2023 0124   BASOSABS 0.0 07/09/2023 0124   CMP    Component Value Date/Time   NA 134 (L) 07/15/2023 0846   K 3.7 07/15/2023 0846   CL 103 07/15/2023 0846   CO2 27 07/15/2023 0846   GLUCOSE 90 07/15/2023 0846   BUN 10 07/15/2023 0846   CREATININE 1.02 (H) 07/15/2023 0846   CALCIUM 8.8 (L) 07/15/2023 0846   PROT 5.8 (L) 07/09/2023 0124   ALBUMIN 3.1 (L) 07/09/2023 0124   AST 19 07/09/2023 0124   ALT 12 07/09/2023 0124   ALKPHOS 61 07/09/2023 0124   BILITOT 1.0 07/09/2023 0124   GFRNONAA 56 (L) 07/15/2023 0846   GFRAA 57 (L) 12/09/2017 0531   COAGS Lab Results  Component Value Date   INR 1.0 07/09/2023   INR 1.1 09/20/2022   INR 1.23 12/07/2017   Lipid Panel    Component Value Date/Time   CHOL 128 07/09/2023 0124   TRIG 59 07/09/2023 0124   HDL 48 07/09/2023 0124   CHOLHDL 2.7 07/09/2023 0124   VLDL 12 07/09/2023 0124   LDLCALC 68 07/09/2023 0124   HgbA1C  Lab Results  Component Value Date   HGBA1C 5.7 (H) 07/09/2023   Alcohol Level    Component Value Date/Time   ETH <10 07/09/2023 0124     SIGNIFICANT DIAGNOSTIC STUDIES IR PERCUTANEOUS ART THROMBECTOMY/INFUSION INTRACRANIAL INC DIAG ANGIO Result Date: 07/10/2023 INDICATION: New onset aphasia and right-sided weakness. 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 EXAM: 1. EMERGENT LARGE VESSEL OCCLUSION THROMBOLYSIS (anterior CIRCULATION) COMPARISON:  CT angiogram of the head and neck July 08, 2023. MEDICATIONS: No antibiotic was administered within 1 hour of the procedure. ANESTHESIA/SEDATION: General anesthesia. CONTRAST:  Omnipaque 300 approximately 75 cc. FLUOROSCOPY TIME:  Fluoroscopy Time: 9 minutes 54 seconds (812 mGy).  COMPLICATIONS: None immediate. TECHNIQUE: Following a full explanation of the procedure along with the potential associated complications, an informed witnessed consent was obtained. The risks of intracranial hemorrhage of 10%, worsening neurological deficit, ventilator dependency, death and inability to revascularize were all reviewed in detail with the patient's son. The patient was then put under general anesthesia by the Department of Anesthesiology at [facility]. The right groin was prepped and draped in the usual sterile fashion. Thereafter using modified Seldinger technique, transfemoral access into the right common femoral artery was obtained without difficulty. Over an 0.035 inch guidewire an 8 French 25 cm Pinnacle sheath was inserted. Through this, and also over an 0.035 inch guidewire a combination of an 088 100 cm Zoom support catheter with a 125 cm 6 French Simmons 2 support catheter was advanced to the aortic  arch region, and selectively positioned in the left common carotid artery, and the distal left internal carotid artery. FINDINGS: The left common carotid arteriogram demonstrates the left external carotid artery and its major branches to be widely patent. The left internal carotid to the skull base is widely patent with a moderate to severe tortuosity of the mid cervical ICA. The petrous, the cavernous and the supraclinoid left ICA are widely patent with mild stenosis in the caval segment. A left posterior communicating artery is seen opacifying the left posterior cerebral artery distribution. The left anterior cerebral artery opacifies into the capillary and venous phases. The left middle cerebral artery demonstrates a stable moderate stenosis in the mid M1 segment with near complete occlusion distally at the level of the trifurcation branches. PROCEDURE: Through the 088 support catheter in the distal cervical ICA, a combination of an 062 Penumbra aspiration catheter with an 043 distal  aspiration catheter was advanced over an 035 inch colossal guidewire to the supraclinoid left ICA. The wire was then advanced through the occluded stented segment of the middle cerebral artery into the distal stented segment in the superior division followed by advancement of the 043 and the 062 aspiration catheters into the occluded stent. The wire was removed. Aspiration was then applied for about 30 seconds at the hub of the 043 aspiration catheter, with a Penumbra aspiration pump at the hub of the 062 aspiration catheter for approximately 2 minutes. Following retrieval of the two aspiration catheters, a control arteriogram performed through the 088 support catheter demonstrated complete revascularization of the left middle cerebral artery distribution achieving a TICI 3 revascularization. Also noted was patency of the stented segment and also of the proximal left middle cerebral artery M1 segment significantly improved compared to the pre treatment caliber. Control arteriogram was then performed at 10, 20 and 35 minutes post revascularization. These continued to demonstrate improved caliber of the left middle cerebral artery distribution. The patient was given a total of 4.5 mg of intra arterial Integrilin in order to prevent platelet aggregation within the stent. A final control arteriogram performed through the 088 support catheter in the left common carotid artery demonstrates the left internal carotid artery to be widely patent to the cranial skull base with no change in the tortuosity. Intracranially, the left middle cerebral artery and the left anterior cerebral artery distributions continued to demonstrate wide patency and TICI 3 revascularization. The 088 support catheter was removed. An 8 French Angio-Seal closure device was used for hemostasis at the right groin puncture site. Distal pulses remained present bilaterally unchanged from prior to the procedure. A flat panel CT of the brain demonstrated no  evidence of hemorrhagic complications. The patient was extubated. Upon recovery, the patient followed simple commands intermittently appropriately. Movement was noted in the right upper and right lower extremity. Patient was then transferred to the neuro ICU for post revascularization care. IMPRESSION: Status post complete revascularization of near complete occlusion of the left middle cerebral artery stented segment with 1 pass with a combination of 062 and 043 aspiration catheters achieving a TICI 3 revascularization. PLAN: As per referring MD. Electronically Signed   By: Julieanne Cotton M.D.   On: 07/10/2023 08:50   IR CT Head Ltd Result Date: 07/10/2023 INDICATION: New onset aphasia and right-sided weakness. 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 EXAM: 1. EMERGENT LARGE VESSEL OCCLUSION THROMBOLYSIS (anterior CIRCULATION) COMPARISON:  CT angiogram of the head and neck July 08, 2023. MEDICATIONS: No antibiotic was administered within 1 hour of the procedure. ANESTHESIA/SEDATION: General anesthesia. CONTRAST:  Omnipaque 300 approximately 75 cc. FLUOROSCOPY TIME:  Fluoroscopy Time: 9 minutes 54 seconds (812 mGy). COMPLICATIONS: None immediate. TECHNIQUE: Following a full explanation of the procedure along with the potential associated complications, an informed witnessed consent was obtained. The risks of intracranial hemorrhage of 10%, worsening neurological deficit, ventilator dependency, death and inability to revascularize were all reviewed in detail with the patient's son. The patient was then put under general anesthesia by the Department of Anesthesiology at [facility]. The right groin was prepped and draped in the usual sterile fashion. Thereafter using modified Seldinger technique, transfemoral access into the right common femoral artery was obtained without difficulty. Over an 0.035 inch guidewire an 8 French 25 cm Pinnacle  sheath was inserted. Through this, and also over an 0.035 inch guidewire a combination of an 088 100 cm Zoom support catheter with a 125 cm 6 French Simmons 2 support catheter was advanced to the aortic arch region, and selectively positioned in the left common carotid artery, and the distal left internal carotid artery. FINDINGS: The left common carotid arteriogram demonstrates the left external carotid artery and its major branches to be widely patent. The left internal carotid to the skull base is widely patent with a moderate to severe tortuosity of the mid cervical ICA. The petrous, the cavernous and the supraclinoid left ICA are widely patent with mild stenosis in the caval segment. A left posterior communicating artery is seen opacifying the left posterior cerebral artery distribution. The left anterior cerebral artery opacifies into the capillary and venous phases. The left middle cerebral artery demonstrates a stable moderate stenosis in the mid M1 segment with near complete occlusion distally at the level of the trifurcation branches. PROCEDURE: Through the 088 support catheter in the distal cervical ICA, a combination of an 062 Penumbra aspiration catheter with an 043 distal aspiration catheter was advanced over an 035 inch colossal guidewire to the supraclinoid left ICA. The wire was then advanced through the occluded stented segment of the middle cerebral artery into the distal stented segment in the superior division followed by advancement of the 043 and the 062 aspiration catheters into the occluded stent. The wire was removed. Aspiration was then applied for about 30 seconds at the hub of the 043 aspiration catheter, with a Penumbra aspiration pump at the hub of the 062 aspiration catheter for approximately 2 minutes. Following retrieval of the two aspiration catheters, a control arteriogram performed through the 088 support catheter demonstrated complete revascularization of the left middle  cerebral artery distribution achieving a TICI 3 revascularization. Also noted was patency of the stented segment and also of the proximal left middle cerebral artery M1 segment significantly improved compared to the pre treatment caliber. Control arteriogram was then performed at 10, 20 and 35 minutes post revascularization. These continued to demonstrate improved caliber of the left middle cerebral artery distribution. The patient was given a total of 4.5 mg of intra arterial Integrilin in order to prevent platelet aggregation within the stent. A final control arteriogram performed through the 088 support catheter in the left common carotid artery demonstrates the left internal carotid artery to be widely patent to the cranial skull base with no change in the tortuosity. Intracranially, the left middle cerebral artery and the left anterior cerebral artery distributions continued to demonstrate wide patency and TICI 3 revascularization. The 088 support catheter was removed. An 8 [place] Angio-Seal closure  device was used for hemostasis at the right groin puncture site. Distal pulses remained present bilaterally unchanged from prior to the procedure. A flat panel CT of the brain demonstrated no evidence of hemorrhagic complications. The patient was extubated. Upon recovery, the patient followed simple commands intermittently appropriately. Movement was noted in the right upper and right lower extremity. Patient was then transferred to the neuro ICU for post revascularization care. IMPRESSION: Status post complete revascularization of near complete occlusion of the left middle cerebral artery stented segment with 1 pass with a combination of 062 and 043 aspiration catheters achieving a TICI 3 revascularization. PLAN: As per referring MD. Electronically Signed   By: Julieanne Cotton M.D.   On: 07/10/2023 08:50   ECHOCARDIOGRAM COMPLETE Result Date: 07/09/2023    ECHOCARDIOGRAM REPORT   Patient Name:   JAQUANDA WICKERSHAM Date of Exam: 07/09/2023 Medical Rec #:  [id number]        Height:       62.0 in Accession #:    [id number]       Weight:       153.9 lb Date of Birth:  1945-12-10        BSA:          1.710 m Patient Age:    77 years         BP:           112/88 mmHg Patient Gender: F                HR:           57 bpm. Exam Location:  Inpatient Procedure: 2D Echo, Cardiac Doppler and Color Doppler (Both Spectral and Color            Flow Doppler were utilized during procedure). Indications:    Stroke I63.9  History:        Patient has prior history of Echocardiogram examinations, most                 recent 06/29/2023. CHF, CAD, Stroke, CKD 3 and COPD; Risk                 Factors:Dyslipidemia and Non-Smoker.  Sonographer:    Dondra Prader RVT RCS Referring Phys: [id number] Lynnae January IMPRESSIONS  1. Left ventricular ejection fraction, by estimation, is 65 to 70%. The left ventricle has normal function. The left ventricle has no regional wall motion abnormalities. Left ventricular diastolic parameters were normal.  2. Right ventricular systolic function is normal. The right ventricular size is normal.  3. Left atrial size was severely dilated.  4. The mitral valve is normal in structure. Mild mitral valve regurgitation.  5. The aortic valve is tricuspid. Aortic valve regurgitation is not visualized. Aortic valve sclerosis/calcification is present, without any evidence of aortic stenosis. Comparison(s): The left ventricular function is unchanged. FINDINGS  Left Ventricle: Left ventricular ejection fraction, by estimation, is 65 to 70%. The left ventricle has normal function. The left ventricle has no regional wall motion abnormalities. The left ventricular internal cavity size was normal in size. There is  no left ventricular hypertrophy. Left ventricular diastolic parameters were normal. Right Ventricle: The right ventricular size is normal. Right vetricular wall thickness was not assessed. Right ventricular systolic function is  normal. Left Atrium: Left atrial size was severely dilated. Right Atrium: Right atrial size was normal in size. Pericardium: Trivial pericardial effusion is present. Mitral Valve: The mitral valve is normal in structure. Mild mitral valve  regurgitation. Tricuspid Valve: The tricuspid valve is normal in structure. Tricuspid valve regurgitation is mild. Aortic Valve: The aortic valve is tricuspid. Aortic valve regurgitation is not visualized. Aortic valve sclerosis/calcification is present, without any evidence of aortic stenosis. Aortic valve mean gradient measures 6.0 mmHg. Aortic valve peak gradient measures 11.1 mmHg. Aortic valve area, by VTI measures 2.30 cm. Pulmonic Valve: The pulmonic valve was normal in structure. Pulmonic valve regurgitation is not visualized. Aorta: The aortic root and ascending aorta are structurally normal, with no evidence of dilitation. IAS/Shunts: No atrial level shunt detected by color flow Doppler.  LEFT VENTRICLE PLAX 2D LVIDd:         4.40 cm   Diastology LVIDs:         2.80 cm   LV e' medial:    8.11 cm/s LV PW:         1.20 cm   LV E/e' medial:  20.4 LV IVS:        0.90 cm   LV e' lateral:   7.26 cm/s LVOT diam:     2.00 cm   LV E/e' lateral: 22.8 LV SV:         85 LV SV Index:   50 LVOT Area:     3.14 cm  RIGHT VENTRICLE             IVC RV Basal diam:  3.75 cm     IVC diam: 2.90 cm RV Mid diam:    2.70 cm RV S prime:     11.35 cm/s TAPSE (M-mode): 1.7 cm LEFT ATRIUM             Index        RIGHT ATRIUM           Index LA diam:        3.90 cm 2.28 cm/m   RA Area:     20.30 cm LA Vol (A2C):   89.9 ml 52.57 ml/m  RA Volume:   61.60 ml  36.02 ml/m LA Vol (A4C):   92.0 ml 53.80 ml/m LA Biplane Vol: 94.9 ml 55.49 ml/m  AORTIC VALVE                     PULMONIC VALVE AV Area (Vmax):    2.28 cm      PV Vmax:       0.96 m/s AV Area (Vmean):   2.34 cm      PV Peak grad:  3.7 mmHg AV Area (VTI):     2.30 cm AV Vmax:           166.33 cm/s AV Vmean:          111.667 cm/s AV VTI:             0.372 m AV Peak Grad:      11.1 mmHg AV Mean Grad:      6.0 mmHg LVOT Vmax:         120.75 cm/s LVOT Vmean:        83.350 cm/s LVOT VTI:          0.272 m LVOT/AV VTI ratio: 0.73  AORTA Ao Root diam: 2.70 cm Ao Asc diam:  3.20 cm MITRAL VALVE                TRICUSPID VALVE MV Area (PHT): 3.42 cm     TR Peak grad:   44.9 mmHg MV Decel Time: 222 msec     TR Vmax:  335.00 cm/s MV E velocity: 165.67 cm/s MV A velocity: 58.90 cm/s   SHUNTS MV E/A ratio:  2.81         Systemic VTI:  0.27 m                             Systemic Diam: 2.00 cm Dietrich Pates MD Electronically signed by Dietrich Pates MD Signature Date/Time: 07/09/2023/2:00:23 PM    Final    CT HEAD WO CONTRAST ( ) Result Date: 07/09/2023 CLINICAL DATA:  78 year old female recurrent code stroke presentation on 07/08/2023, left MCA M1 stent thrombus. Status post endovascular revascularization. Punctate left hemisphere infarcts on post treatment MRI. EXAM: CT HEAD WITHOUT CONTRAST TECHNIQUE: Contiguous axial images were obtained from the base of the skull through the vertex without intravenous contrast. RADIATION DOSE REDUCTION: This exam was performed according to the departmental dose-optimization program which includes automated exposure control, adjustment of the mA and/or kV according to patient size and/or use of iterative reconstruction technique. COMPARISON:  Brain MRI yesterday and earlier. FINDINGS: Brain: No acute intracranial hemorrhage identified. No midline shift, mass effect, or evidence of intracranial mass lesion. No ventriculomegaly. Gray-white differentiation appears stable by CT since presentation yesterday with chronic ischemic changes in the left MCA territory, confluent chronic cerebral white matter disease. Subtle diffusion abnormality by MRI is not correlated. And no new cortically based infarct is identified. Vascular: Extensive Calcified atherosclerosis at the skull base. Left MCA stent redemonstrated. Skull: Stable, intact.  Sinuses/Orbits: Left nasoenteric tube now in place. Visualized paranasal sinuses and mastoids are stable and well aerated. Other: No acute orbit or scalp soft tissue finding. IMPRESSION: No acute intracranial hemorrhage and subtle Left MCA territory ischemia on MRI is occult by CT. Stable chronic white matter and ischemic disease. No new intracranial abnormality. Electronically Signed   By: Odessa Fleming M.D.   On: 07/09/2023 06:28   DG Abd 1 View Result Date: 07/09/2023 CLINICAL DATA:  [id number].  Encounter for orogastric tube placement. EXAM: ABDOMEN - 1 VIEW COMPARISON:  Chest, abdomen and pelvis CT 06/29/2023 FINDINGS: There is a large hiatal hernia with about half of the stomach intrathoracic. NGT is in place and coiled within the herniated portion of the stomach. There is mild dilatation of some of the left abdominal small bowel segments up to 3.3 cm which could be due to ileus or partial small bowel obstruction. Gas and stool are present in the colon at least to the proximal descending segment. There are cholecystectomy clips. No supine evidence of free air. Lung bases are clear. Cardiomegaly. IMPRESSION: 1. Large hiatal hernia with about half of the stomach intrathoracic. 2. NGT is in place and coiled within the herniated portion of the stomach. 3. Mild dilatation of some of the left abdominal small bowel segments up to 3.3 cm which could be due to ileus or partial small bowel obstruction. Electronically Signed   By: Almira Bar M.D.   On: 07/09/2023 06:12   MR BRAIN WO CONTRAST Result Date: 07/09/2023 CLINICAL DATA:  Stroke, follow up EXAM: MRI HEAD WITHOUT CONTRAST TECHNIQUE: Multiplanar, multiecho pulse sequences of the brain and surrounding structures were obtained without intravenous contrast. COMPARISON:  MRI head May 21 24. FINDINGS: Brain: Small acute infarct in the left insula and a few punctate acute infarcts in the left frontal lobe. No significant mass effect. Remote left basal ganglia infarcts.  Patchy additional T2/FLAIR hyperintensities in the white matter, compatible with chronic microvascular ischemic change.  No midline shift. No hydrocephalus. No mass lesion. Vascular: Major arterial flow voids are maintained at the skull base. Stented left MCA. Skull and upper cervical spine: Normal marrow signal. Sinuses/Orbits: Clear sinuses.  No acute orbital findings. Other: No mastoid effusions. IMPRESSION: Small acute infarct in the left insula and a few punctate acute infarcts in the left frontal lobe Electronically Signed   By: Feliberto Harts M.D.   On: 07/09/2023 01:53   CT ANGIO HEAD NECK W WO CM W PERF (CODE STROKE) Result Date: 07/08/2023 CLINICAL DATA:  Provided history: Neuro deficit, acute, stroke suspected. Slurred speech. Right-sided weakness. Confusion. Unable to follow commands. EXAM: CT ANGIOGRAPHY HEAD AND NECK CT PERFUSION BRAIN TECHNIQUE: Multidetector CT imaging of the head and neck was performed using the standard protocol during bolus administration of intravenous contrast. Multiplanar CT image reconstructions and MIPs were obtained to evaluate the vascular anatomy. Carotid stenosis measurements (when applicable) are obtained utilizing NASCET criteria, using the distal internal carotid diameter as the denominator. Multiphase CT imaging of the brain was performed following IV bolus contrast injection. Subsequent parametric perfusion maps were calculated using RAPID software. RADIATION DOSE REDUCTION: This exam was performed according to the departmental dose-optimization program which includes automated exposure control, adjustment of the mA and/or kV according to patient size and/or use of iterative reconstruction technique. CONTRAST:  OMNIPAQUE IOHEXOL 350 MG/ML SOLN COMPARISON:  Non-contrast head CT performed earlier today 07/08/2023. CTA head/neck 09/19/2022. Brain MRI 09/23/2022. CT chest/abdomen/pelvis 06/29/2023. FINDINGS: CTA NECK FINDINGS Aortic arch: Standard aortic  branching. Atherosclerotic plaque within the visualized thoracic aorta and proximal major branch vessels of the neck. No hemodynamically significant innominate or proximal subclavian artery stenosis. Right carotid system: CCA and ICA patent within the neck without hemodynamically significant stenosis (50% or greater). Atherosclerotic plaque at the CCA origin and about the carotid bifurcation. Left carotid system: CCA and ICA patent within the neck without hemodynamically significant stenosis (50% or greater). Atherosclerotic plaque at the CCA origin, scattered elsewhere within the CCA and about the carotid bifurcation. Tortuosity of the cervical ICA. Mild nonspecific fusiform dilation of the mid-to-distal cervical ICA, unchanged from the prior CTA of 09/19/2022. Vertebral arteries: The vertebral arteries are patent within the neck without stenosis or significant atherosclerotic disease. The left vertebral artery is slightly dominant. Skeleton: Spondylosis of the cervical and visualized upper thoracic levels. The patient is edentulous. Other neck: No neck mass or cervical lymphadenopathy. Upper chest: 14 mm part-solid right upper lobe pulmonary nodule as was demonstrated on the recent prior CT chest/abdomen/pelvis of 09/19/2022 (series 7, image 391). Review of the MIP images confirms the above findings CTA HEAD FINDINGS Anterior circulation: The intracranial internal carotid arteries are patent. Atherosclerotic plaque within both vessels with no more than mild stenosis. A vascular stent is present within the M1 and M2 left middle cerebral artery. There is a subocclusive filling defect within the stented portion of the distal M1 left middle cerebral artery consistent with thrombus (for instance as seen on series 9, image 166) (series 7, image 132). This results in a severe stenosis. No left M2 proximal branch occlusion is identified. Severe stenosis within an M3 left MCA vessel (series 13, image 29). The right middle  cerebral artery M1 segment is patent. No right M2 proximal branch occlusion or high-grade proximal arterial stenosis. The anterior cerebral arteries are patent. 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 Posterior circulation: The intracranial vertebral arteries are patent. Mild atherosclerotic irregularity of the V4 right vertebral artery. The basilar artery is patent. The posterior cerebral arteries are patent. A left posterior communicating artery is present. The right posterior communicating artery is diminutive or absent. Venous sinuses: Within the limitations of contrast timing, no convincing thrombus. Anatomic variants: As described. Review of the MIP images confirms the above findings CT Brain Perfusion Findings: CBF (<30%) Volume: 8mL Perfusion (Tmax>6.0s) volume: 56mL Mismatch Volume: 48mL Infarction Location:Left MCA vascular territory. CTA head impressions #1 and #2 These results were called by telephone at the time of interpretation on 07/08/2023 at 6:41 pm to provider MCNEILL [facility] , who verbally acknowledged these results. IMPRESSION: CTA neck: 1. The common carotid and internal carotid arteries are patent within the neck without hemodynamically significant stenosis. Atherosclerotic plaque bilaterally, as described. 2. Vertebral arteries patent within the neck without stenosis or significant atherosclerotic disease. 3. Aortic Atherosclerosis (ICD10-I70.0). 4. 14 mm part-solid right upper lobe pulmonary nodule as was demonstrated on the recent prior CT chest/abdomen/pelvis of 06/29/2023 (series 7, image 391). Consider one of the following: a follow-up non-contrast chest CT 3 months from the previous examination of 06/29/2023, a follow-up PET-CT or tissue sampling. This recommendation follows the consensus statement: Guidelines for Management of  Incidental Pulmonary Nodules Detected on CT Images: From the Fleischner Society 2017; Radiology 2017; 284:228-243. CTA head: 1. Subocclusive filling defect (consistent with thrombus) within the stented portion of the left middle cerebral artery M1 segment. Resultant severe stenosis at this site. 2. Severe stenosis within a left middle cerebral artery M3 segment vessel. 3. Background intracranial sclerotic disease as described. 4. 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 CT perfusion head: The perfusion software identifies an 8 mL core infarct within the left MCA vascular territory. The perfusion software identifies a 56 mL region of critically hypoperfused parenchyma within the left MCA vascular territory (utilizing the Tmax>6 seconds threshold.). Reported mismatch volume: 48 mL. Electronically Signed   By: Jackey Loge D.O.   On: 07/08/2023 19:18   CT HEAD CODE STROKE WO CONTRAST Result Date: 07/08/2023 CLINICAL DATA:  Code stroke. Provided history: Slurred speech. Right-sided weakness. Confusion. Inability to follow commands. EXAM: CT HEAD WITHOUT CONTRAST TECHNIQUE: Contiguous axial images were obtained from the base of the skull through the vertex without intravenous contrast. RADIATION DOSE REDUCTION: This exam was performed according to the departmental dose-optimization program which includes automated exposure control, adjustment of the mA and/or kV according to patient size and/or use of iterative reconstruction technique. COMPARISON:  Brain MRI 09/23/2022. FINDINGS: Brain: Generalized cerebral and cerebellar atrophy. Chronic infarcts within the left basal ganglia. Small chronic cortical infarct within the mid left frontal lobe (series 6, image 22). Background moderate patchy and ill-defined hypoattenuation within the cerebral white matter, nonspecific but compatible with chronic small vessel ischemic disease. There is no  acute intracranial hemorrhage. No acute demarcated cortical infarct. No extra-axial fluid collection. No evidence of an intracranial mass. No midline shift. Vascular: Atherosclerotic calcifications. Vascular stent along the course of the left middle cerebral artery M1 and proximal M2 segments. No hyperdense vessel identified elsewhere. Skull: No calvarial fracture or aggressive osseous lesion. Sinuses/Orbits: No mass or acute finding within the imaged orbits. Minimal mucosal thickening within bilateral ethmoid air cells. ASPECTS [facility] Stroke Program Early CT Score) - Ganglionic level infarction (caudate, lentiform nuclei, internal capsule, insula, M1-M3 cortex): 7 - Supraganglionic infarction (  M4-M6 cortex): 3 Total score (0-10 with 10 being normal): 10 (when discounting chronic infarcts). No evidence of an acute intracranial abnormality. These results were communicated to Dr. Amada Jupiter at 6:33 pmon 3/5/2025by text page via the [facility] messaging system. IMPRESSION: 1.  No evidence of an acute intracranial abnormality. 2. Parenchymal atrophy, chronic small vessel ischemic disease and chronic infarcts, as described. 3. Vascular stent along the course of the left middle cerebral artery M1 and M2 segments. Electronically Signed   By: Jackey Loge D.O.   On: 07/08/2023 18:37       HISTORY OF PRESENT ILLNESS 78 y.o. patient with history of A-fib on Eliquis, hypertension, hyperlipidemia, diabetes, migraine, OSA not on CPAP, dementia, CHF, GBS, GI bleeding, stroke status post stenting on Brilinta admitted for right-sided weakness, slurred speech, right facial droop and aphasia. No TNK given due to on Eliquis.  Patient was found to have subocclusive stenosis of the left M1 stent for which she underwent emergent mechanical thrombectomy with resultant excellent TICI 3 revascularization of the left M1.  He did well and was extubated.  Subsequently she developed right groin hematoma at the incision site requiring acute  pressure and she became acutely anemic requiring blood transfusion with PRBCs.  Brilinta and Eliquis were held for few days and subsequently Eliquis were restarted prior to discharge.  Therapy evaluation recommended home physical and Occupational Therapy however patient discharge was prolonged by few days due to her home situation as she was found to have lice on admission there is a physical therapist to go to her home until lice were exterminated.  On the day of discharge the patient did express some suicidal ideation for which psychiatry were consulted but after their evaluation patient was felt to be not at risk for harming herself from [place] considered safe to be discharged home HOSPITAL COURSE Stroke:  left subinsular patchy and punctate left frontal infarcts, embolic likely secondary to A-fib not compliant with Eliquis s/p mechanical thrombectomy  CT no acute finding CT head and neck left M1 stent distal subocclusive occlusion.  Left M3 severe stenosis.  Bilateral ICA siphon atherosclerosis right more than left Status post IR with TICI3 revascularization of the left MCA M1 MRI left insular cortex patchy infarct, punctate left frontal infarct CT repeat no acute finding 2D Echo EF 65 to 70% LDL 68 HgbA1c 5.7 SCDs for VTE prophylaxis Brilinta (ticagrelor) 90 mg bid and Eliquis (apixaban) daily (but per pharmacy, no refill record for either of these medications) prior to admission, Eliquis. Patient counseled to be compliant with her antithrombotic medications Ongoing aggressive stroke risk factor management Therapy recommendations: Pending Disposition: Pending   Severe anemia Right groin hemorrhage at incision site Patient denies any leg pain, abdominal pain Examination soft nontender abdomen Hemoglobin 8.5--6.4--2 x PRBC--7.6--7.6 -- 6.9 -1 u PRBCs -> 8.6- 7.6- 9.2 -8.7 - STABLE  Nausea/vomiting PRN zofran ordered One time dose of phenergan   Suicidal ideation, acute stress  reaction Endorsing suicidal thoughts and plan with RN Psych consult placed Cleared for discharge by psychiatry   History of stroke 09/2022 left MCA infarct with left M1 occlusion, status post IR with TICI3 and left MCA stenting.  EF 60 to 65%.  LDL 120, A1c 5.5.  Discharged on Brilinta and Eliquis and Crestor 40.   A-fib Rate controlled Home Eliquis but noncompliant per pharmacy record Continue Eliquis as inpatient   Diabetes HgbA1c 5.7 goal < 7.0 Controlled CBG monitoring SSI DM education and close PCP follow up   Hypertension  Stable on the high end Resume home losartan 50 BP goal less than 180/105 now Long-term BP goal normotensive   Hyperlipidemia Home meds: Crestor 40 LDL 68, goal < 70 Now on Crestor 40 Continue statin at discharge   Dysphagia Did not pass swallow Has NG tube On tube feeding   Other Stroke Risk Factors Advanced age Migraines Obstructive sleep apnea   Other Active Problems History of GBS Dementia History of GI bleeding   DISCHARGE EXAM  PHYSICAL EXAM General:  Alert, well-nourished, well-developed patient in no acute distress Psych:  Mood and affect appropriate for situation CV: Regular rate and rhythm on monitor Respiratory:  Regular, unlabored respirations on room air GI: Abdomen soft and nontender   General - Well nourished, well developed, in no apparent distress.     Cardiovascular - irregularly irregular heart rate and rhythm.   Neuro - awake, alert, eyes open, orientated to age, place, time. No aphasia, paucity of language, following all commands.  Voice is clear. no gaze palsy, tracking bilaterally, visual field full, PERRL. No facial droop. Tongue midline.  Bilateral UEs 4/5, no drift. Bilaterally LEs 4/5, no drift.  Sensation symmetrical bilaterally, b/l FTN intact grossly, gait not tested.   1a Level of Conscious.: 0 1b LOC Questions: 0 1c LOC Commands: 0 2 Best Gaze: 0 3 Visual: 0 4 Facial Palsy: 0 5a Motor Arm -  left: 0 5b Motor Arm - Right: 0 6a Motor Leg - Left: 0 6b Motor Leg - Right: 0 7 Limb Ataxia: 0 8 Sensory: 0 9 Best Language: 0 10 Dysarthria: 0 11 Extinct. and Inatten.: 0 TOTAL: 0   Discharge Diet       Diet   Diet regular Room service appropriate? Yes; Fluid consistency: Thin   liquids  DISCHARGE PLAN Disposition: Home Eliquis (apixaban) daily for secondary stroke prevention  Ongoing stroke risk factor control by Primary Care Physician at time of discharge Follow-up PCP Achreja, Youlanda Mighty, MD in 2 weeks. Follow-up in [facility] in 8 weeks, office to schedule an appointment. Able to see NP in clinic.  34 minutes were spent preparing discharge.  Patient seen and examined by NP/APP with MD. MD to update note as needed.   Elmer Picker, DNP, FNP-BC [facility] Pager: [phone]  I have personally obtained history,examined this patient, reviewed notes, independently viewed imaging studies, participated in medical decision making and plan of care.ROS completed by me personally and pertinent positives fully documented  I have made any additions or clarifications directly to the above note. Agree with note above.    Delia Heady, MD Medical Director [facility] Pager: [phone] 07/15/2023 5:11 PM

## 2023-07-15 NOTE — TOC Transition Note (Signed)
 Transition of Care [facility]) - Discharge Note   Patient Details  Name: Melissa [name] MRN: [id number] Date of Birth: 02/28/1946  Transition of Care [facility]) CM/SW Contact:  Kermit Balo, RN Phone Number: 07/15/2023, 2:26 PM   Clinical Narrative:     Pt is discharging home with self care. PT recommending HH but unable to arrange due to bed bugs at home. Pt willing to have info entered into [employer] to see if she can get assist with bed bugs.  No new DME needs.  Pt doesn't have transportation home. CM will provide her with cab voucher for transport.  CM will also add number to medicaid transportation so she can have transportation to her f/u appointments.    Final next level of care: Home/Self Care Barriers to Discharge: No Barriers Identified   Patient Goals and CMS Choice            Discharge Placement                       Discharge Plan and Services Additional resources added to the After Visit Summary for     Discharge Planning Services: CM Consult                                 Social Drivers of Health (SDOH) Interventions SDOH Screenings   Food Insecurity: Food Insecurity Present (07/12/2023)  Housing: Low Risk  (07/13/2023)  Transportation Needs: No Transportation Needs (07/12/2023)  Recent Concern: Transportation Needs - Unmet Transportation Needs (06/22/2023)   Received from [facility]  Utilities: Not At Risk (07/12/2023)  Financial Resource Strain: Medium Risk (06/22/2023)   Received from [facility]  Physical Activity: Inactive (06/22/2023)   Received from [facility]  Social Connections: Unknown (07/12/2023)  Stress: Stress Concern Present (06/22/2023)   Received from [facility]  Tobacco Use: Medium Risk (07/14/2023)  Health Literacy: Medium Risk (06/22/2023)   Received from [facility]     Readmission Risk Interventions     No data to display

## 2023-07-15 NOTE — Progress Notes (Signed)
 Patient called RN into her room and advised RN that she "wanted to kill herself", RN asked patient if she had a plan and she endorsed "I will OD".   MD notified of patient's communication to RN regarding suicidal Ideations and new orders received.  After patient voiced above suicidal ideations to RN and endorsed that she had a plan, she spoke with her brother and called RN back to room to advise that she did not wish to commit suicide and that she would like to take back her previous statements.   MD notified however due to patient stating suicidal ideations, suicide precautions to stay in place pending psychiatric evaluation.

## 2023-07-15 NOTE — Evaluation (Signed)
 Occupational Therapy Evaluation Patient Details Name: Melissa [name] MRN: [id number] DOB: 1946/02/17 Today's Date: 07/15/2023   History of Present Illness   The pt is a 78 yo female presenting 3/5 with  right-sided facial droop and weakness. Work up revealed: infarct of L MCA, s/p thrombectomy 3/5. Pt with bleeding from groin site 3/6. 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     Clinical Impressions Melissa [name] was re-evaluated per MD. She lives with her son and is indep/mod I at baseline. Upon evaluation the pt was limited by generalized weakness and baseline unsteady gait. Overall she is mod I for all mobility and ADLs which is her functional baseline. Pt did not express any concerns about discharging home. She continues to not have acute or follow up OT needs, will sign off. Thank you.       Functional Status Assessment   Patient has had a recent decline in their functional status and demonstrates the ability to make significant improvements in function in a reasonable and predictable amount of time.     Equipment Recommendations   None recommended by OT      Precautions/Restrictions   Precautions Precautions: Fall Recall of Precautions/Restrictions: Intact Restrictions Weight Bearing Restrictions Per Provider Order: No     Mobility Bed Mobility Overal bed mobility: Independent                  Transfers Overall transfer level: Modified independent Equipment used: Rolling walker (2 wheels)                      Balance Overall balance assessment: Mild deficits observed, not formally tested             ADL either performed or assessed with clinical judgement   ADL Overall ADL's : Modified independent               General ADL Comments: no assist needed, pt a little  unsteady but  no overt safety concern     Vision Baseline Vision/History: 1 Wears glasses Vision Assessment?: No apparent visual deficits     Perception Perception: Within Functional Limits       Praxis Praxis: WFL       Pertinent Vitals/Pain Pain Assessment Pain Assessment: No/denies pain     Extremity/Trunk Assessment Upper Extremity Assessment Upper Extremity Assessment: LUE deficits/detail LUE Deficits / Details: 4th digit trigger finger pt reports "thats been like that" - otherwise [facility]   Lower Extremity Assessment Lower Extremity Assessment: Defer to PT evaluation   Cervical / Trunk Assessment Cervical / Trunk Assessment: Normal   Communication Communication Communication: Impaired Factors Affecting Communication: Hearing impaired   Cognition Arousal: Alert Behavior During Therapy: Flat affect Cognition: No family/caregiver present to determine baseline             OT - Cognition Comments: basic orientation and command following are [facility]. pt reports no concerns with discharging home.                 Following commands: Intact       Cueing  General Comments   Cueing Techniques: Gestural cues;Verbal cues  VSS    Home Living Family/patient expects to be discharged to:: Private residence Living Arrangements: Children Available Help at Discharge: Family;Available 24 hours/day Type of Home: House Home Access: Stairs to enter [employer] of Steps: 5 Entrance  Stairs-Rails: Right;Left Home Layout: One level     Bathroom Shower/Tub: Chief Strategy Officer: Standard     Home Equipment: Agricultural consultant (2 wheels);Cane - single point;Wheelchair - Sport and exercise psychologist Comments: Lives with son who has epiliepsy but can provide 24 hr physical support.  Neither drive. bed bug noted on 07/09/23 admission. no animals      Prior Functioning/Environment Prior Level of Function : Needs assist             Mobility Comments:  Ambulates with cane - could ambulate in community, reports using RW in the house ADLs Comments: son does the cleaning in the house, groceries and meds are delivered to the house. pt and son do not drive            OT Goals(Current goals can be found in the care plan section)   Acute Rehab OT Goals Potential to Achieve Goals: Good   AM-PAC OT "6 Clicks" Daily Activity     Outcome Measure Help from another person eating meals?: None Help from another person taking care of personal grooming?: None Help from another person toileting, which includes using toliet, bedpan, or urinal?: None Help from another person bathing (including washing, rinsing, drying)?: None Help from another person to put on and taking off regular upper body clothing?: None Help from another person to put on and taking off regular lower body clothing?: None 6 Click Score: 24   End of Session Equipment Utilized During Treatment: Gait belt Nurse Communication: Mobility status;Precautions  Activity Tolerance: Patient tolerated treatment well Patient left: in bed;with bed alarm set;with call bell/phone within reach  OT Visit Diagnosis: Unsteadiness on feet (R26.81)                Time: 1610-9604 OT Time Calculation (min): 15 min Charges:  OT General Charges $OT Visit: 1 Visit OT Evaluation $OT Eval Low Complexity: 1 Low  Derenda Mis, OTR/L Acute Rehabilitation Services Office [phone] Secure Chat Communication Preferred   Donia Pounds 07/15/2023, 3:12 PM

## 2023-07-15 NOTE — Progress Notes (Signed)
 Physical Therapy Re-Evaluation Patient Details Name: Melissa [name] MRN: [id number] DOB: 1946/03/22 Today's Date: 07/15/2023  History of Present Illness  The pt is a 78 yo female presenting 3/5 with  right-sided facial droop and weakness. Work up revealed: infarct of L MCA, s/p thrombectomy 3/5. Pt with bleeding from groin site 3/6. 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  Clinical Impression  Pt is presenting close to baseline level of functioning. At baseline pt seems to have balance deficits that place her at high risk for falls. Pt has assist from son at home. Pt was mod I all all activities including stairs. Pt used RW which she occasionally uses at home. Due to pt current functional status, home set up and available assistance at home recommending skilled physical therapy services 3x/week in order to address strength, balance and functional mobility to decrease risk for falls, injury and re-hospitalization.   Will sign off in acute care hospital setting due to pt is at baseline level of functioning.  Pt cleared for home from a physical therapist perspective once medically stable.       If plan is discharge home, recommend the following: Assist for transportation     Equipment Recommendations None recommended by PT     Functional Status Assessment Patient has not had a recent decline in their functional status     Precautions / Restrictions Precautions Precautions: Fall Recall of Precautions/Restrictions: Intact Restrictions Weight Bearing Restrictions Per Provider Order: No      Mobility  Bed Mobility Overal bed mobility: Independent    Transfers Overall transfer level: Modified independent Equipment used: Rolling walker (2 wheels) Transfers: Sit to/from Stand Sit to Stand: Modified independent (Device/Increase  time)    Ambulation/Gait Ambulation/Gait assistance: Modified independent (Device/Increase time) Gait Distance (Feet): 200 Feet Assistive device: Rolling walker (2 wheels) Gait Pattern/deviations: Step-through pattern, Decreased stride length, Drifts right/left Gait velocity: decreased Gait velocity interpretation: 1.31 - 2.62 ft/sec, indicative of limited community ambulator   General Gait Details: pt with small strides; improves with cues. Pt intermittently dancing while using RW. Appears to be at baseline. Occasionally gets distracted and runs into obstacles witH RW without Lob  Stairs Stairs: Yes Stairs assistance: Modified independent (Device/Increase time) Stair Management: One rail Right, Alternating pattern, Forwards, Step to pattern Number of Stairs: 4 General stair comments: Per home set up   Modified Rankin (Stroke Patients Only) Modified Rankin (Stroke Patients Only) Pre-Morbid Rankin Score: No symptoms Modified Rankin: No significant disability     Balance Overall balance assessment: Mild deficits observed, not formally tested             Pertinent Vitals/Pain Pain Assessment Pain Assessment: No/denies pain    Home Living Family/patient expects to be discharged to:: Private residence Living Arrangements: Children (Son) Available Help at Discharge: Family;Available 24 hours/day Type of Home: House Home Access: Stairs to enter Entrance Stairs-Rails: Doctor, general practice of Steps: 5   Home Layout: One level Home Equipment: Agricultural consultant (2 wheels);Cane - single point;Wheelchair - Lawyer Comments: Lives with son who has epiliepsy but can provide 24 hr physical support.  Neither drive. bed bug noted on 07/09/23 admission. no animals    Prior Function Prior Level of Function : Needs assist     Mobility Comments: Ambulates with cane - could ambulate in community, reports using RW in the house ADLs Comments: son  does the  cleaning in the house, they shop together for groceries     Extremity/Trunk Assessment   Upper Extremity Assessment Upper Extremity Assessment: Defer to OT evaluation    Lower Extremity Assessment Lower Extremity Assessment: Overall WFL for tasks assessed    Cervical / Trunk Assessment Cervical / Trunk Assessment: Normal  Communication   Communication Communication: Impaired Factors Affecting Communication: Reduced clarity of speech;Hearing impaired    Cognition Arousal: Alert Behavior During Therapy: Flat affect   PT - Cognitive impairments: No family/caregiver present to determine baseline       Following commands: Intact       Cueing Cueing Techniques: Gestural cues, Verbal cues     General Comments General comments (skin integrity, edema, etc.): No noted cardiac/respiratory distress throughout session        Assessment/Plan    PT Assessment All further PT needs can be met in the next venue of care  PT Problem List Decreased activity tolerance;Decreased balance;Decreased mobility;Decreased coordination;Decreased safety awareness       PT Treatment Interventions      PT Goals (Current goals can be found in the Care Plan section)  Acute Rehab PT Goals PT Goal Formulation: All assessment and education complete, DC therapy            AM-PAC PT "6 Clicks" Mobility  Outcome Measure Help needed turning from your back to your side while in a flat bed without using bedrails?: None Help needed moving from lying on your back to sitting on the side of a flat bed without using bedrails?: None Help needed moving to and from a bed to a chair (including a wheelchair)?: None Help needed standing up from a chair using your arms (e.g., wheelchair or bedside chair)?: None Help needed to walk in hospital room?: None Help needed climbing 3-5 steps with a railing? : None 6 Click Score: 24    End of Session Equipment Utilized During Treatment: Gait belt Activity  Tolerance: Patient tolerated treatment well Patient left: in bed;with call bell/phone within reach Nurse Communication: Mobility status PT Visit Diagnosis: Unsteadiness on feet (R26.81);Muscle weakness (generalized) (M62.81)    Time: 1610-9604 PT Time Calculation (min) (ACUTE ONLY): 23 min   Charges:   PT Evaluation $PT Re-evaluation: 1 Re-eval PT Treatments $Therapeutic Activity: 8-22 mins PT General Charges $$ ACUTE PT VISIT: 1 Visit       Harrel Carina, DPT, CLT  Acute Rehabilitation Services Office: [phone] (Secure chat preferred)   Claudia Desanctis 07/15/2023, 1:27 PM

## 2023-09-17 ENCOUNTER — Emergency Department (HOSPITAL_COMMUNITY)

## 2023-09-17 ENCOUNTER — Observation Stay (HOSPITAL_COMMUNITY)
Admission: EM | Admit: 2023-09-17 | Discharge: 2023-09-18 | Disposition: A | Discharge status: Home / Self Care | Attending: Infectious Diseases | Admitting: Infectious Diseases

## 2023-09-17 ENCOUNTER — Other Ambulatory Visit (HOSPITAL_COMMUNITY): Payer: Self-pay

## 2023-09-17 ENCOUNTER — Observation Stay (HOSPITAL_BASED_OUTPATIENT_CLINIC_OR_DEPARTMENT_OTHER)

## 2023-09-17 ENCOUNTER — Encounter (HOSPITAL_COMMUNITY): Payer: Self-pay

## 2023-09-17 ENCOUNTER — Other Ambulatory Visit: Payer: Self-pay

## 2023-09-17 DIAGNOSIS — R5381 Other malaise: Secondary | ICD-10-CM

## 2023-09-17 DIAGNOSIS — R0602 Shortness of breath: Principal | ICD-10-CM

## 2023-09-17 DIAGNOSIS — I509 Heart failure, unspecified: Secondary | ICD-10-CM | POA: Insufficient documentation

## 2023-09-17 DIAGNOSIS — I5032 Chronic diastolic (congestive) heart failure: Secondary | ICD-10-CM

## 2023-09-17 DIAGNOSIS — I513 Intracardiac thrombosis, not elsewhere classified: Principal | ICD-10-CM | POA: Diagnosis present

## 2023-09-17 DIAGNOSIS — I110 Hypertensive heart disease with heart failure: Secondary | ICD-10-CM | POA: Diagnosis not present

## 2023-09-17 DIAGNOSIS — Z79899 Other long term (current) drug therapy: Secondary | ICD-10-CM | POA: Diagnosis not present

## 2023-09-17 DIAGNOSIS — Z7901 Long term (current) use of anticoagulants: Secondary | ICD-10-CM | POA: Diagnosis not present

## 2023-09-17 DIAGNOSIS — I236 Thrombosis of atrium, auricular appendage, and ventricle as current complications following acute myocardial infarction: Secondary | ICD-10-CM | POA: Diagnosis not present

## 2023-09-17 DIAGNOSIS — F0390 Unspecified dementia without behavioral disturbance: Secondary | ICD-10-CM | POA: Diagnosis not present

## 2023-09-17 DIAGNOSIS — J449 Chronic obstructive pulmonary disease, unspecified: Secondary | ICD-10-CM | POA: Diagnosis not present

## 2023-09-17 DIAGNOSIS — R911 Solitary pulmonary nodule: Secondary | ICD-10-CM

## 2023-09-17 DIAGNOSIS — F419 Anxiety disorder, unspecified: Secondary | ICD-10-CM | POA: Diagnosis not present

## 2023-09-17 DIAGNOSIS — E876 Hypokalemia: Secondary | ICD-10-CM | POA: Insufficient documentation

## 2023-09-17 DIAGNOSIS — K219 Gastro-esophageal reflux disease without esophagitis: Secondary | ICD-10-CM | POA: Diagnosis not present

## 2023-09-17 DIAGNOSIS — I10 Essential (primary) hypertension: Secondary | ICD-10-CM

## 2023-09-17 DIAGNOSIS — B889 Infestation, unspecified: Secondary | ICD-10-CM | POA: Diagnosis not present

## 2023-09-17 DIAGNOSIS — Z8673 Personal history of transient ischemic attack (TIA), and cerebral infarction without residual deficits: Secondary | ICD-10-CM | POA: Diagnosis not present

## 2023-09-17 DIAGNOSIS — D509 Iron deficiency anemia, unspecified: Secondary | ICD-10-CM | POA: Diagnosis not present

## 2023-09-17 DIAGNOSIS — I517 Cardiomegaly: Secondary | ICD-10-CM

## 2023-09-17 DIAGNOSIS — I4891 Unspecified atrial fibrillation: Secondary | ICD-10-CM | POA: Insufficient documentation

## 2023-09-17 LAB — COMPREHENSIVE METABOLIC PANEL WITH GFR
ALT: 12 U/L (ref 0–44)
AST: 17 U/L (ref 15–41)
Albumin: 3.6 g/dL (ref 3.5–5.0)
Alkaline Phosphatase: 87 U/L (ref 38–126)
Anion gap: 10 (ref 5–15)
BUN: 7 mg/dL — ABNORMAL LOW (ref 8–23)
CO2: 24 mmol/L (ref 22–32)
Calcium: 9.1 mg/dL (ref 8.9–10.3)
Chloride: 104 mmol/L (ref 98–111)
Creatinine, Ser: 1 mg/dL (ref 0.44–1.00)
GFR, Estimated: 57 mL/min — ABNORMAL LOW (ref >60)
Glucose, Bld: 99 mg/dL (ref 70–99)
Potassium: 3.4 mmol/L — ABNORMAL LOW (ref 3.5–5.1)
Sodium: 138 mmol/L (ref 135–145)
Total Bilirubin: 0.5 mg/dL (ref 0.0–1.2)
Total Protein: 7.1 g/dL (ref 6.5–8.1)

## 2023-09-17 LAB — I-STAT VENOUS BLOOD GAS, ED
Acid-Base Excess: 1 mmol/L (ref 0.0–2.0)
Bicarbonate: 24.4 mmol/L (ref 20.0–28.0)
Calcium, Ion: 1.14 mmol/L — ABNORMAL LOW (ref 1.15–1.40)
HCT: 26 % — ABNORMAL LOW (ref 36.0–46.0)
Hemoglobin: 8.8 g/dL — ABNORMAL LOW (ref 12.0–15.0)
O2 Saturation: 90 %
Potassium: 3.4 mmol/L — ABNORMAL LOW (ref 3.5–5.1)
Sodium: 140 mmol/L (ref 135–145)
TCO2: 25 mmol/L (ref 22–32)
pCO2, Ven: 34.3 mmHg — ABNORMAL LOW (ref 44–60)
pH, Ven: 7.459 — ABNORMAL HIGH (ref 7.25–7.43)
pO2, Ven: 55 mmHg — ABNORMAL HIGH (ref 32–45)

## 2023-09-17 LAB — CBC WITH DIFFERENTIAL/PLATELET
Abs Immature Granulocytes: 0.01 10*3/uL (ref 0.00–0.07)
Basophils Absolute: 0 10*3/uL (ref 0.0–0.1)
Basophils Relative: 1 %
Eosinophils Absolute: 0.4 10*3/uL (ref 0.0–0.5)
Eosinophils Relative: 8 %
HCT: 26.8 % — ABNORMAL LOW (ref 36.0–46.0)
Hemoglobin: 7.7 g/dL — ABNORMAL LOW (ref 12.0–15.0)
Immature Granulocytes: 0 %
Lymphocytes Relative: 21 %
Lymphs Abs: 1 10*3/uL (ref 0.7–4.0)
MCH: 21.2 pg — ABNORMAL LOW (ref 26.0–34.0)
MCHC: 28.7 g/dL — ABNORMAL LOW (ref 30.0–36.0)
MCV: 73.6 fL — ABNORMAL LOW (ref 80.0–100.0)
Monocytes Absolute: 0.5 10*3/uL (ref 0.1–1.0)
Monocytes Relative: 9 %
Neutro Abs: 3 10*3/uL (ref 1.7–7.7)
Neutrophils Relative %: 61 %
Platelets: 496 10*3/uL — ABNORMAL HIGH (ref 150–400)
RBC: 3.64 MIL/uL — ABNORMAL LOW (ref 3.87–5.11)
RDW: 19.2 % — ABNORMAL HIGH (ref 11.5–15.5)
WBC: 4.9 10*3/uL (ref 4.0–10.5)
nRBC: 0 % (ref 0.0–0.2)

## 2023-09-17 LAB — ECHOCARDIOGRAM COMPLETE
AR max vel: 1.92 cm2
AV Peak grad: 9.4 mmHg
Ao pk vel: 1.53 m/s
Area-P 1/2: 2.14 cm2
MV VTI: 1.57 cm2
S' Lateral: 3 cm

## 2023-09-17 LAB — TROPONIN I (HIGH SENSITIVITY)
Troponin I (High Sensitivity): 8 ng/L (ref <18)
Troponin I (High Sensitivity): 9 ng/L (ref <18)

## 2023-09-17 LAB — PROTIME-INR
INR: 1 (ref 0.8–1.2)
Prothrombin Time: 13.5 s (ref 11.4–15.2)

## 2023-09-17 LAB — TYPE AND SCREEN
ABO/RH(D): A POS
Antibody Screen: NEGATIVE

## 2023-09-17 LAB — BRAIN NATRIURETIC PEPTIDE: B Natriuretic Peptide: 172.1 pg/mL — ABNORMAL HIGH (ref 0.0–100.0)

## 2023-09-17 MED ORDER — ROSUVASTATIN CALCIUM 20 MG PO TABS
40.0000 mg | ORAL_TABLET | Freq: Every day | ORAL | Status: DC
  Administered 2023-09-17 – 2023-09-18 (×2): 40 mg via ORAL
  Filled 2023-09-17 (×2): qty 2

## 2023-09-17 MED ORDER — SERTRALINE HCL 50 MG PO TABS
25.0000 mg | ORAL_TABLET | Freq: Every day | ORAL | Status: DC
  Administered 2023-09-17 – 2023-09-18 (×2): 25 mg via ORAL
  Filled 2023-09-17 (×2): qty 1

## 2023-09-17 MED ORDER — LOSARTAN POTASSIUM 50 MG PO TABS
50.0000 mg | ORAL_TABLET | Freq: Every day | ORAL | Status: DC
  Administered 2023-09-17 – 2023-09-18 (×2): 50 mg via ORAL
  Filled 2023-09-17 (×2): qty 1

## 2023-09-17 MED ORDER — HYDROXYZINE HCL 10 MG PO TABS
10.0000 mg | ORAL_TABLET | Freq: Three times a day (TID) | ORAL | Status: DC | PRN
  Administered 2023-09-18: 10 mg via ORAL
  Filled 2023-09-17: qty 1

## 2023-09-17 MED ORDER — APIXABAN 5 MG PO TABS
5.0000 mg | ORAL_TABLET | Freq: Two times a day (BID) | ORAL | Status: DC
  Administered 2023-09-17 – 2023-09-18 (×3): 5 mg via ORAL
  Filled 2023-09-17 (×3): qty 1

## 2023-09-17 MED ORDER — ACETAMINOPHEN 325 MG PO TABS
650.0000 mg | ORAL_TABLET | Freq: Four times a day (QID) | ORAL | Status: DC | PRN
  Filled 2023-09-17: qty 2

## 2023-09-17 MED ORDER — AMLODIPINE BESYLATE 5 MG PO TABS
5.0000 mg | ORAL_TABLET | Freq: Every day | ORAL | Status: DC
  Administered 2023-09-17 – 2023-09-18 (×2): 5 mg via ORAL
  Filled 2023-09-17 (×2): qty 1

## 2023-09-17 MED ORDER — ALBUTEROL SULFATE HFA 108 (90 BASE) MCG/ACT IN AERS: 2.0000 | INHALATION_SPRAY | Freq: Four times a day (QID) | RESPIRATORY_TRACT | Status: DC | PRN

## 2023-09-17 MED ORDER — IOHEXOL 350 MG/ML SOLN
75.0000 mL | Freq: Once | INTRAVENOUS | Status: AC | PRN
  Administered 2023-09-17: 75 mL via INTRAVENOUS

## 2023-09-17 MED ORDER — ACETAMINOPHEN 650 MG RE SUPP: 650.0000 mg | Freq: Four times a day (QID) | RECTAL | Status: DC | PRN

## 2023-09-17 NOTE — H&P (Signed)
 Date: 09/17/2023               Patient Name:  Melissa [name] MRN: [id number]  DOB: 02/14/1946 Age / Sex: 78 y.o., female   PCP: Lonni Robert, MD              Medical Service: Internal Medicine Teaching Service              Attending Physician: Dr. Sandie Cross, MD    First Contact: Joretta Newborn, MS4 Pager: [phone]  Second Contact: Dr. Cathey Clunes Pager: [phone]  Third Contact Dr. Abe Hodgkins, MD         After Hours (After 5p/  First Contact Pager: [phone]  weekends / holidays): Second Contact Pager: [phone]   Chief Complaint: Shortness of breath  History of Present Illness:  Melissa [name] is a 78 y.o. female with PMHx of left MCA infarct in May 2024, recently admitted this March with sub-occlusive stenosis of left M1 stent s/p revascularization, HTN, HLD, CHF, COPD, dementia, DM, A fib and anxiety/depression who presents with acute onset shortness of breath. She cannot remember when SOB started nor able to describe the nature of the SOB. She is prescribed Eliquis , but does not remember last time she took it or how long had been taking it before she ran out. Lives at home with son, whom she states has epilepsy and a difficult time with her care and medications. She endorses weakness, worse in right side vs left, which has been present since her initial stroke. At home she ambulates with a cane/walker with significant SOB and a wheelchair. Also reports SOB at night, sleeping with head elevated. Uses an inhaler at home, no oxygen. Denies CP or palpitations. Reports abdominal pain and diarrhea with intermittent blood in stool as well as dysuria, but unable to describe or remember onset of any of these symptoms. Also having right shoulder pain while laying in bed, this has not been present at home. Denies fevers, chills.   In the ED: Patient was brought in by EMS for acute shortness of breath. Vitals in ED were notable for elevated BP to 182/94. Other vital  signs were normal, including oxygen sats. Bed bugs found on patient, she was de-clothed and clothes were placed in a bag. CXR showed no acute disease. CT PE was obtained and showed possible left atrial appendage thrombus as well as cardiomegaly with a small pleural effusion and a lung nodule that has increased in size from last imaging, no pulmonary embolism visualized. Given CT PE was notable for new thrombus and patient not taking anticoagulation IMTS was paged for admission.  Of note, was considered for Watchman at last cardiology visit in Oct 2024 and was given Eliquis  while in the hospital in March 2025.  Labs in ED: CBC with Hgb 7.7 (approximately at baseline), normal WBC, K 3.4, GFR 57, INR 1. BNP mildly elevated to 172.1, troponins negative.   Imaging in ED: EKG: Afib, rate controlled, no ischemic changes. CXR: Clear lungs, no PTX or pleural effusions. Cardiomegaly. CT PE: No pulmonary embolism, but did show possible left atrial appendage thrombus as well as cardiomegaly with a small pleural effusion and a lung nodule that has increased in size from last imaging.  Meds:  Albuterol  Amlodipine  5 mg Eliquis  5 mg BID Hydroxyzine 10 mg BID Losartan  50 mg Crestor  40 mg Zoloft  25 mg Colace Ensure No outpatient medications have been marked as taking for the 09/17/23 encounter [facility] Encounter).  Allergies: Allergies as of 09/17/2023 - Review Complete 07/10/2023  Allergen Reaction Noted   Nsaids Other (See Comments) 09/20/2022   Codeine Nausea And Vomiting and Other (See Comments) 03/19/2016   Lyrica [pregabalin] Other (See Comments) 12/07/2017   Penicillins Other (See Comments) 12/07/2017   Past Medical History:  Diagnosis Date   Anemia    Anxiety    Arthritis    "all over" (12/08/2017)   Asthma    CHF (congestive heart failure) (HCC)    Chronic lower back pain    COPD (chronic obstructive pulmonary disease) (HCC)    Dementia (HCC)    Depression    Diabetes mellitus  without complication (HCC)    Fibromyalgia    Gastric ulcer    GERD (gastroesophageal reflux disease)    Guillain Barr syndrome (HCC)    Hematemesis 12/07/2017   History of blood transfusion    "low blood" (12/08/2017)   History of stomach ulcers    Hypercholesterolemia    Hypertension    Migraine    "used to have them; don't have them anymore" (12/08/2017)   Pneumonia    "probably twice" (12/08/2017)   Sleep apnea    "couldn't take the mask" (12/08/2017)   Stroke (HCC)    "I don't remember if I have had one or not; I believe I have" (12/08/2017)   Family History: N/A  Social History:  Lives with son who has epilepsy Independent in ADLs PCP: Lonni Robert, MD  Review of Systems: A complete ROS was negative except as per HPI.  Physical Exam: Blood pressure 139/67, pulse 62, temperature 97.8 F (36.6 C), temperature source Oral, resp. rate 15, SpO2 98%.  Constitutional: Laying in bed, no acute distress HENT: normocephalic atraumatic Cardiovascular: Irregularly irregular, normal rate Pulmonary/Chest: Clear to auscultation anteriorly. Normal work of breathing on room air Abdominal: soft, non-tender, non-distended. Normal bowel sounds Neurological: alert & oriented x 3 Extremities: Warm and well perfused. Bilateral pedal pulses palpable. No LE edema. Psych: Normal mood and affect  Assessment & Plan by Problem: Principal Problem:   Thrombus of atrial appendage  Acute Shortness of Breath Etiology uncertain at this time, may be related to ongoing Afib with intermittent RVR. Patient does have history of CHF and COPD, but current presentation is not concerning for exacerbation of either given CXR without pulmonary edema or pleural effusion and is euvolemic on exam. She does not endorse recent cough, increased sputum production and pulmonary exam unremarkable for tachypnea, increased work of breathing or wheezing and she is satting 91-100% on room air. Given these findings, need  for respiratory support is not indicated at this time.  Afib w/ intermittent RVR Left Atrial Appendage Thrombus Presented to ED in Afib, initially rate controlled, now intermittently in RVR. Has not been taking Eliquis  for unknown period of time. Is otherwise hemodynamically stable. Of note, the patient was being considered for a Watchman device in the outpatient setting, unsure if this would be considered while she is inpatient. - Cardiology consulted for management of new left atrial thrombus  - Echo pending - Restart home dose of Eliquis  5 mg BID  - Telemetry  Hx of left MCA infarct Recent subocclusive stenosis of left M1 stent Left MCA infarct in 09/2022 s/p revascularization with TICI3 and left MCA stenting. Non-compliant with Eliquis . Physical exam without acute focal neurological findings.  - Continue home Crestor  40 mg daily - Eliquis  5 mg BID  Hypokalemia K in ED 3.4.  - Repleted with KCl 40 mEq  HTN - Continue home Losartan  50 mg daily - Continue Amlodipine  5 mg daily  COPD - Continue home albuterol   Anxiety/depression - Continue home Zoloft  25 mg and hydroxyzine  Bed bugs - Contact precautions  Dispo: Admit patient to Observation with expected length of stay less than 2 midnights.  Signed: Marieta Shorten, Medical Student 09/17/2023, 9:04 AM  Pager: [phone]  Attestation for Student Documentation:  I personally was present and performed or re-performed the history, physical exam and medical decision-making activities of this service and have verified that the service and findings are accurately documented in the student's note.  Jandel Patriarca N, DO 09/17/2023, 1:16 PM

## 2023-09-17 NOTE — Consult Note (Addendum)
 Cardiology Consultation   Patient ID: OZA BERNARDY MRN: [id number]; DOB: 05-30-1945  Admit date: 09/17/2023 Date of Consult: 09/17/2023  PCP:  Lonni Robert, MD   [facility] Providers Cardiologist:  None  Electrophysiologist:  Boyce Byes, MD  {  Patient Profile:   Melissa [name] is a 78 y.o. female with a hx of chronic HFpEF, stroke, permanent atrial fibrillation, dementia, HTN, COPD, DM, history of GI bleed, anxiety, depression who is being seen 09/17/2023 for the evaluation of A. Fib at the request of Dr. Alwin Baars.  History of Present Illness:   Melissa [name] has past medical history as stated above. She presented to [facility] via EMS on 09/17/2023 complaining of shortness of breath. Upon arrival to the ED it was noted that her SpO2 was normal, VS were normal besides some HTN. She was not noted to be in respiratory distress. She is a poor historian and is unable to describe the nature of her shortness of breath. She reports residual weakness that has been presented since her recent stroke.   Relevant workup in the ED includes: CBC with hemoglobin of 7.7, troponin negative x 2, BNP 172, EKG showed A. Fib HR 57 bpm, CXR showed stable cardiomegaly with no active disease, CTPA showed no PE, new LAA thrombus, small pericardial effusion, coronary atherosclerosis, lung nodule suspicious for adenocarcinoma, ground-glass apical nodule, along with other incidental findings.   She was last seen by Dr. Marven Slimmer on 02/06/2023 for atrial fibrillation.  At this visit they decided they were going to do a Watchman procedure, it does not appear that the patient followed up after this appointment.  When she presented to the hospital in March 2025 for a stroke it was deemed that it was likely secondary to noncompliance of her Eliquis .  At this point she was continued on Eliquis  5 mg twice daily.  It was noted that this hospital visit that the Brilinta  and Eliquis  that she was  supposed to be taking had no fill record of either of those medications per pharmacy.  I attempted to speak with the patient however she did not stay awake to answer my questions. She was very drowsy, told me that she felt tired and just went back to sleep. There was no family present in the room at this time. I have gotten most of her history from prior notes this admission. It seems that with her baseline dementia, she is a poor historian. She lives at home with her son, who she says has epilepsy and does not always take care of her medications.  Past Medical History:  Diagnosis Date   Anemia    Anxiety    Arthritis    "all over" (12/08/2017)   Asthma    CHF (congestive heart failure) (HCC)    Chronic lower back pain    COPD (chronic obstructive pulmonary disease) (HCC)    Dementia (HCC)    Depression    Diabetes mellitus without complication (HCC)    Fibromyalgia    Gastric ulcer    GERD (gastroesophageal reflux disease)    Guillain Barr syndrome (HCC)    Hematemesis 12/07/2017   History of blood transfusion    "low blood" (12/08/2017)   History of stomach ulcers    Hypercholesterolemia    Hypertension    Migraine    "used to have them; don't have them anymore" (12/08/2017)   Pneumonia    "probably twice" (12/08/2017)   Sleep apnea    "couldn't  take the mask" (12/08/2017)   Stroke [facility])    "I don't remember if I have had one or not; I believe I have" (12/08/2017)   Past Surgical History:  Procedure Laterality Date   ABDOMINAL HERNIA REPAIR     ABDOMINAL HYSTERECTOMY     APPENDECTOMY     BIOPSY  12/08/2017   Procedure: BIOPSY;  Surgeon: Lajuan Pila, MD;  Location: [facility] ENDOSCOPY;  Service: Endoscopy;;   BREAST SURGERY Left    "tumors removed; not cancer"   BUNIONECTOMY Bilateral    CARPAL TUNNEL RELEASE Bilateral    CATARACT EXTRACTION, BILATERAL Bilateral    CHOLECYSTECTOMY     COLONOSCOPY  03/03/2011   Mild sigmoid diverticulosis. Internal hemorrhoids.    DILATION AND  CURETTAGE OF UTERUS     ELBOW FRACTURE SURGERY Left    ESOPHAGOGASTRODUODENOSCOPY N/A 12/08/2017   Procedure: ESOPHAGOGASTRODUODENOSCOPY (EGD);  Surgeon: Lajuan Pila, MD;  Location: [facility] ENDOSCOPY;  Service: Endoscopy;  Laterality: N/A;   ESOPHAGOGASTRODUODENOSCOPY ENDOSCOPY  12/08/2017   w/bx   FRACTURE SURGERY     HERNIA REPAIR     HIP FRACTURE SURGERY Left    IR CT HEAD LTD  09/20/2022   IR CT HEAD LTD  09/20/2022   IR CT HEAD LTD  07/08/2023   IR PERCUTANEOUS ART THROMBECTOMY/INFUSION INTRACRANIAL INC DIAG ANGIO  09/20/2022   IR PERCUTANEOUS ART THROMBECTOMY/INFUSION INTRACRANIAL INC DIAG ANGIO  07/08/2023   KNEE ARTHROSCOPY Right    RADIOLOGY WITH ANESTHESIA N/A 09/20/2022   Procedure: IR WITH ANESTHESIA;  Surgeon: Luellen Sages, MD;  Location: [facility] OR;  Service: Radiology;  Laterality: N/A;   RADIOLOGY WITH ANESTHESIA N/A 07/08/2023   Procedure: RADIOLOGY WITH ANESTHESIA;  Surgeon: Radiologist, Medication, MD;  Location: [facility] OR;  Service: Radiology;  Laterality: N/A;   SHOULDER OPEN ROTATOR CUFF REPAIR Bilateral    TONSILLECTOMY      Home Medications:  Prior to Admission medications   Medication Sig Start Date End Date Taking? Authorizing Provider  albuterol  (VENTOLIN  HFA) 108 (90 Base) MCG/ACT inhaler Inhale 2 puffs into the lungs every 6 (six) hours as needed for wheezing or shortness of breath.   Yes [provider]  amLODipine  (NORVASC ) 5 MG tablet Take 1 tablet (5 mg total) by mouth daily. 07/16/23   Imogene Mana, NP  apixaban  (ELIQUIS ) 5 MG TABS tablet Take 1 tablet (5 mg total) by mouth 2 (two) times daily. 09/23/22   Audrene Lease, NP  docusate sodium  (COLACE) 100 MG capsule Take 1 capsule (100 mg total) by mouth daily. 09/24/22   Lehner, Erin C, NP  feeding supplement (ENSURE ENLIVE / ENSURE PLUS) LIQD Take 237 mLs by mouth 2 (two) times daily between meals. 07/16/23   Imogene Mana, NP  hydrOXYzine (ATARAX) 10 MG tablet Take 10 mg by mouth 2 (two) times daily. 06/15/23    [provider]  losartan  (COZAAR ) 50 MG tablet Take 50 mg by mouth daily. 06/24/23 07/24/23  [provider]  rosuvastatin  (CRESTOR ) 40 MG tablet Take 1 tablet (40 mg total) by mouth daily. 09/24/22   Audrene Lease, NP  sertraline  (ZOLOFT ) 25 MG tablet Take 25 mg by mouth daily. 06/15/23   [provider]   Inpatient Medications: Scheduled Meds:  amLODipine   5 mg Oral Daily   apixaban   5 mg Oral BID   losartan   50 mg Oral Daily   rosuvastatin   40 mg Oral Daily   sertraline   25 mg Oral Daily   Continuous Infusions:  PRN Meds: acetaminophen  **  OR** acetaminophen , albuterol , hydrOXYzine  Allergies:    Allergies  Allergen Reactions   Nsaids Other (See Comments)    CAN TAKE ONLY TYLENOL - NOTHING ELSE!!!!!!   Codeine Nausea And Vomiting and Other (See Comments)    GI Intolerance   Lyrica [Pregabalin] Other (See Comments)    Reaction not known at this time   Penicillins Other (See Comments)    Childhood allergy- exact reaction not noted   Social History:   Social History   Socioeconomic History   Marital status: Divorced    Spouse name: Not on file   Number of children: Not on file   Years of education: Not on file   Highest education level: Not on file  Occupational History   Not on file  Tobacco Use   Smoking status: Never   Smokeless tobacco: Former    Types: Chew   Tobacco comments:    12/08/2017 "stopped chewing a few years back"  Vaping Use   Vaping status: Never Used  Substance and Sexual Activity   Alcohol  use: Never   Drug use: Never   Sexual activity: Not Currently  Other Topics Concern   Not on file  Social History Narrative   Not on file   Social Drivers of Health   Financial Resource Strain: Medium Risk (06/22/2023)   [facility]   Overall Financial Resource Strain (CARDIA)    Difficulty of Paying Living Expenses: Somewhat hard  Food Insecurity: Food Insecurity Present (07/12/2023)   Hunger Vital Sign     Worried About Running Out of Food in the Last Year: Sometimes true    Ran Out of Food in the Last Year: Sometimes true  Transportation Needs: No Transportation Needs (07/12/2023)   PRAPARE - Administrator, Civil Service (Medical): No    Lack of Transportation (Non-Medical): No  Recent Concern: Transportation Needs - Unmet Transportation Needs (06/22/2023)   Received from [facility] - Transportation    Lack of Transportation (Medical): Yes    Lack of Transportation (Non-Medical): Yes  Physical Activity: Inactive (06/22/2023)   Received from [facility]   Exercise Vital Sign    Days of Exercise per Week: 0 days    Minutes of Exercise per Session: 0 min  Stress: Stress Concern Present (06/22/2023)   Received from [facility] of Occupational Health - Occupational Stress Questionnaire    Feeling of Stress : To some extent  Social Connections: Unknown (07/12/2023)   Social Connection and Isolation Panel [NHANES]    Frequency of Communication with Friends and Family: Once a week    Frequency of Social Gatherings with Friends and Family: Once a week    Attends Religious Services: 1 to 4 times per year    Active Member of [employer] or Organizations: Patient unable to answer    Attends Banker Meetings: Patient unable to answer    Marital Status: Patient unable to answer  Intimate Partner Violence: Not At Risk (07/12/2023)   Humiliation, Afraid, Rape, and Kick questionnaire    Fear of Current or Ex-Partner: No    Emotionally Abused: No    Physically Abused: No    Sexually Abused: No    Family History:   History reviewed. No pertinent family history.   ROS:  Please see the history of present illness.  All other ROS reviewed and negative.     Physical Exam/Data:   Vitals:   09/17/23 1245  09/17/23 1300 09/17/23 1315 09/17/23 1330  BP: (!) 150/75 (!) 143/70 (!) 145/71 (!) 163/82  Pulse: 71 (!) 30 (!) 44 60  Resp: 16 15 16 17    Temp:      TempSrc:      SpO2: 98% 97% 99% 100%   No intake or output data in the 24 hours ending 09/17/23 1352    07/10/2023    5:00 AM 07/08/2023    6:12 PM 09/20/2022    4:52 AM  Last 3 Weights  Weight (lbs) 153 lb 3.5 oz 153 lb 14.1 oz 162 lb 14.7 oz  Weight (kg) 69.5 kg 69.8 kg 73.9 kg     There is no height or weight on file to calculate BMI.   General: chronically ill appearing elderly female, drowsy, in no acute distress  HEENT: normal Neck: no JVD Vascular: No carotid bruits; Distal pulses 2+ bilaterally Cardiac:  irregularly irregular, no murmur  Lungs:  clear to auscultation bilaterally, no wheezing, rhonchi or rales  Abd: soft, nontender Ext: no edema Musculoskeletal:  No deformities Skin: warm and dry   EKG:  The EKG was personally reviewed and demonstrates:  atrial fibrillation, HR 57  Telemetry:  Telemetry was personally reviewed and demonstrates:  atrial fibrillation, HR 60s  Relevant CV Studies: Echocardiogram 09/17/2023 Left ventricular ejection fraction, by estimation, is 60 to 65% . The left ventricle has normal function. The left ventricle has no regional wall motion abnormalities. Left ventricular diastolic function could not be evaluated.  Right ventricular systolic function is normal. The right ventricular size is normal. There is normal pulmonary artery systolic pressure.  Right atrial size was moderately dilated.  Left atrial size was mildly dilated.  The aortic valve is tricuspid. Aortic valve regurgitation is trivial. Aortic valve sclerosis/ calcification is present, without any evidence of aortic stenosis. Aortic valve Vmax measures 1. 53 m/ s.  The mitral valve is normal in structure. Trivial mitral valve regurgitation. No evidence of mitral stenosis.  The inferior vena cava is dilated in size with > 50% respiratory variability, suggesting right atrial pressure of 8 mmHg.  Cannot visualize left atrial appendage on 2D echo. Consider TEE or Chest CTA if  there is a need to rule out LAA.  Laboratory Data:  High Sensitivity Troponin:   Recent Labs  Lab 09/17/23 0332 09/17/23 0706  TROPONINIHS 8 9     Chemistry Recent Labs  Lab 09/17/23 0332 09/17/23 0349  NA 138 140  K 3.4* 3.4*  CL 104  --   CO2 24  --   GLUCOSE 99  --   BUN 7*  --   CREATININE 1.00  --   CALCIUM  9.1  --   GFRNONAA 57*  --   ANIONGAP 10  --     Recent Labs  Lab 09/17/23 0332  PROT 7.1  ALBUMIN 3.6  AST 17  ALT 12  ALKPHOS 87  BILITOT 0.5   Lipids No results for input(s): "CHOL", "TRIG", "HDL", "LABVLDL", "LDLCALC", "CHOLHDL" in the last 168 hours.  Hematology Recent Labs  Lab 09/17/23 0332 09/17/23 0349  WBC 4.9  --   RBC 3.64*  --   HGB 7.7* 8.8*  HCT 26.8* 26.0*  MCV 73.6*  --   MCH 21.2*  --   MCHC 28.7*  --   RDW 19.2*  --   PLT 496*  --    Thyroid  No results for input(s): "TSH", "FREET4" in the last 168 hours.  BNP Recent Labs  Lab 09/17/23  1610  BNP 172.1*    DDimer No results for input(s): "DDIMER" in the last 168 hours.  Radiology/Studies:  CT Angio Chest PE W and/or Wo Contrast Result Date: 09/17/2023 CLINICAL DATA:  Shortness of breath. Suspect pulmonary embolism. D-dimer level unknown. EXAM: CT ANGIOGRAPHY CHEST WITH CONTRAST TECHNIQUE: Multidetector CT imaging of the chest was performed using the standard protocol during bolus administration of intravenous contrast. Multiplanar CT image reconstructions and MIPs were obtained to evaluate the vascular anatomy. RADIATION DOSE REDUCTION: This exam was performed according to the departmental dose-optimization program which includes automated exposure control, adjustment of the mA and/or kV according to patient size and/or use of iterative reconstruction technique. CONTRAST:  75mL OMNIPAQUE  IOHEXOL  350 MG/ML SOLN COMPARISON:  Portable chest today, portable chest 09/14/2023, CT chest, abdomen and pelvis with contrast 06/29/2023, CTA chest 01/27/2022. FINDINGS: Cardiovascular: The  heart is moderately enlarged, more so than 2 months ago. Small pericardial effusion collects to the right and inferiorly, unchanged. There is a 1 cm hypodensity in the left atrial appendage most likely due to a thrombus, not seen on the last CT. There are left main and three-vessel coronary artery calcifications, greatest in the LAD and proximal to mid right coronary arteries. Central pulmonary veins are prominent without overt edema. Pulmonary arteries are upper limits of normal caliber with diagnostic opacification and no visible embolus. There is moderate patchy aortic calcific plaque, mild scattered calcification in the great vessels and no aneurysm, stenosis or dissection. Tortuosity in the descending aorta. Mediastinum/Nodes: Large hiatal hernia, the stomach majority intrathoracic. Unremarkable thoracic esophagus. No thyroid  or axillary mass or intrathoracic adenopathy are seen. The trachea is patent. Lungs/Pleura: There is diffuse bronchial thickening in the lower lobes. Linear atelectasis in the medial left lower lobe alongside the hiatal hernia. Again, a subsolid anterior segment right upper lobe ill-defined nodule is again noted measures 1.3 x 0.9 cm, slightly larger than when seen on earlier chest CT from 04/25/2020. Recommend PET-CT or tissue sampling. Findings suspicious for adenocarcinoma. There is a stable partially pleural-based, roughly rectangular ground-glass medial right apical nodule measuring 10 x 0.7 cm on 6:19, unchanged. Rest of the lungs are generally clear. There is no pleural effusion, thickening or pneumothorax. Upper Abdomen: Polycystic kidneys. No acute abnormality. Status post cholecystectomy. Abdominal aortic atherosclerosis. Musculoskeletal: Osteopenia, degenerative changes and slight kyphodextroscoliosis thoracic spine. No acute or significant osseous findings. No mass in the chest wall. Review of the MIP images confirms the above findings. IMPRESSION: 1. No evidence of arterial  dilatation or embolus. 2. 1 cm hypodensity in the left atrial appendage most likely due to a thrombus, not seen on the last CT. 3. Moderate cardiomegaly, more so than 2 months ago. Small pericardial effusion. 4. Aortic and coronary artery atherosclerosis. 5. 1.3 x 0.9 cm subsolid anterior segment right upper lobe nodule, slightly larger than when seen on earlier chest CT from 04/25/2020. Recommend PET-CT or tissue sampling. Findings suspicious for adenocarcinoma. 6. 10 x 0.7 cm ground-glass medial right apical nodule, unchanged. 7. Large hiatal hernia, the stomach majority intrathoracic. 8. Bronchitis. 9. Polycystic kidneys. 10. Osteopenia and degenerative change. Aortic Atherosclerosis (ICD10-I70.0). Electronically Signed   By: Denman Fischer M.D.   On: 09/17/2023 06:41   DG Chest Portable 1 View Result Date: 09/17/2023 CLINICAL DATA:  Dyspnea EXAM: PORTABLE CHEST 1 VIEW COMPARISON:  09/14/2023 FINDINGS: Lungs are clear. No pneumothorax or pleural effusion. Stable cardiomegaly. Or a vascularity is normal. No acute bone abnormality. IMPRESSION: 1. No active disease. Stable cardiomegaly. Electronically Signed  By: Worthy Heads M.D.   On: 09/17/2023 03:53     Assessment and Plan:   Atrial fibrillation with episodes of RVR LAA thrombus, noted on CT, new Medication noncompliance  Patient with known diagnosis of atrial fibrillation Supposed to be taking Eliquis  as an outpatient, but has been noncompliant Previously discussed Watchman device, never followed up Noted to be in rate controlled A-fib upon arrival, with episodes of RVR Currently UU:[id number] fibrillation, HR 60s She has underwent stroke recently in the setting of noncompliance with Eliquis   Hemoglobin 7.7 on admission today  Currently on amlodipine  5 mg daily, Eliquis  5 mg twice daily, losartan  50 mg daily Continue to monitor CBC closely as patients hemoglobin was 7.7 today, she is anemic at baseline, possible has been having bloody stools  and was just restarted on Eliquis  -- will discuss with Dr. Emmette Harms   Chronic HFpEF Patient presented with shortness of breath, unknown duration Echo from 07/23/2023 showed: EF 65 to 70%, severe LA dilation, mild MR Echo this admission: EF 60-65%, normal RV, biatrial enlargement, trivial MR, dilated IVC BNP 172 Patient does not appear volume overloaded on exam  Unable to provide any history in regards to symptom onset or duration  Per primary Anemia Dementia  History of left MCA infarct Recent subocclusive stenosis of left M1 stent Acute shortness of breath Lung nodule noted on CT Incidental CT findings  Hypokalemia Hypertension COPD Anxiety Depression Bed bugs   Risk Assessment/Risk Scores:      CHA2DS2-VASc Score = 7   This indicates a 11.2% annual risk of stroke. The patient's score is based upon: CHF History: 1 HTN History: 1 Diabetes History: 0 Stroke History: 2 Vascular Disease History: 0 Age Score: 2 Gender Score: 1   For questions or updates, please contact [facility] Please consult [email or web address] for contact info under  Signed, Jiles Mote, PA-C  09/17/2023 1:52 PM  Patient seen and examined, note reviewed with the signed Advanced Practice Provider. I personally reviewed laboratory data, imaging studies and relevant notes. I independently examined the patient and formulated the important aspects of the plan. I have personally discussed the plan with the patient and/or family. Comments or changes to the note/plan are indicated below.  Patient seen and examined at her bedside.   Atrial fibrillation with RVR  LAA thrombus, noted on CT new  Medication Noncompliance Chronic heart failure with preserved ejection fraction  Hypertension  COPD  Hx of CVA Depression  Bed Bugs   Currently she is in atrial fibrillation - we will strive for rate control at this time. The issue at hand is the fact that she hs not been taking her Eliquis  and now have a  new note LAA thrombus. Her Eliquis  has been restarted - with her chronic anemia we need to monitor her hgb closely.   Clinically not volume overloaded.   Will continue to follow with you.     Jeffifer Rabold DO, MS [facility] Attending Cardiologist [facility]  [address] [place], [place] [address] [phone] Website: [email or web address]

## 2023-09-17 NOTE — Progress Notes (Addendum)
 PHARMACY - ANTICOAGULATION CONSULT NOTE  Pharmacy Consult for apixaban  dosing. Indication: atrial fibrillation, rate controlled; Possible left atrial appendage on CT  Allergies  Allergen Reactions   Nsaids Other (See Comments)    CAN TAKE ONLY TYLENOL - NOTHING ELSE!!!!!!   Codeine Nausea And Vomiting and Other (See Comments)    GI Intolerance   Lyrica [Pregabalin] Other (See Comments)    Reaction not known at this time   Penicillins Other (See Comments)    Childhood allergy- exact reaction not noted    Patient Measurements:    Vital Signs: Temp: 97.8 F (36.6 C) (05/15 0737) Temp Source: Oral (05/15 0737) BP: 190/68 (05/15 1015) Pulse Rate: 138 (05/15 1015)  Labs: Recent Labs    09/17/23 0332 09/17/23 0349 09/17/23 0706  HGB 7.7* 8.8*  --   HCT 26.8* 26.0*  --   PLT 496*  --   --   LABPROT 13.5  --   --   INR 1.0  --   --   CREATININE 1.00  --   --   TROPONINIHS 8  --  9    CrCl cannot be calculated (Unknown ideal weight.).   Medical History: Past Medical History:  Diagnosis Date   Anemia    Anxiety    Arthritis    "all over" (12/08/2017)   Asthma    CHF (congestive heart failure) (HCC)    Chronic lower back pain    COPD (chronic obstructive pulmonary disease) (HCC)    Dementia (HCC)    Depression    Diabetes mellitus without complication (HCC)    Fibromyalgia    Gastric ulcer    GERD (gastroesophageal reflux disease)    Guillain Barr syndrome (HCC)    Hematemesis 12/07/2017   History of blood transfusion    "low blood" (12/08/2017)   History of stomach ulcers    Hypercholesterolemia    Hypertension    Migraine    "used to have them; don't have them anymore" (12/08/2017)   Pneumonia    "probably twice" (12/08/2017)   Sleep apnea    "couldn't take the mask" (12/08/2017)   Stroke (HCC)    "I don't remember if I have had one or not; I believe I have" (12/08/2017)    Medications:  Scheduled:   apixaban   5 mg Oral BID    Assessment: Criteria for  dose modification are: Age >/= 79 years of age (Patient is 78 years of age); Body weight </= 60 kg (current weight not documented); and Creatinine >/= 1.5  mg/dL (current Scr 1.0 mg/dL). Does not meet criterion for decreasing 5 mg BID to 2.5 mg BID. CBC:  Hemoglobin 8.8 gm/dL, INR 1.0, Creatinine 1.0mg %, No DDI's identified with warfarin from the EPIC/CHL DDI checker.    Plan:  Apixaban  5 mg PO BID. Follow for signs/symptoms of embolic events and or hemorrhage.  Kenda Paula, PharmD, CPP Clinical Pharmacist Practitioner 09/17/2023,10:44 AM

## 2023-09-17 NOTE — ED Provider Notes (Signed)
 [place] EMERGENCY DEPARTMENT AT [facility] Provider Note   CSN: [id number] Arrival date & time: 09/17/23  0320     History  Chief Complaint  Patient presents with   Shortness of Breath    RAMSEY LEVERINGTON is a 78 y.o. female.  58 female presents ER today with dyspnea.  Apparently she called EMS for being short of breath.  On their arrival her oxygen saturation was normal, vital signs were normal besides hypertension, lungs were clear.  No respiratory distress.  Patient states that she had some inhalers.  States she feels that she is breathing normal now.  No lower extremity swelling.  No skin color changes.   Shortness of Breath      Home Medications Prior to Admission medications   Medication Sig Start Date End Date Taking? Authorizing Provider  albuterol  (VENTOLIN  HFA) 108 (90 Base) MCG/ACT inhaler Inhale 2 puffs into the lungs every 6 (six) hours as needed for wheezing or shortness of breath.    [provider]  amLODipine  (NORVASC ) 5 MG tablet Take 1 tablet (5 mg total) by mouth daily. 07/16/23   Imogene Mana, NP  apixaban  (ELIQUIS ) 5 MG TABS tablet Take 1 tablet (5 mg total) by mouth 2 (two) times daily. 09/23/22   Audrene Lease, NP  docusate sodium  (COLACE) 100 MG capsule Take 1 capsule (100 mg total) by mouth daily. 09/24/22   Lehner, Erin C, NP  feeding supplement (ENSURE ENLIVE / ENSURE PLUS) LIQD Take 237 mLs by mouth 2 (two) times daily between meals. 07/16/23   Imogene Mana, NP  hydrOXYzine (ATARAX) 10 MG tablet Take 10 mg by mouth 2 (two) times daily. 06/15/23   [provider]  losartan  (COZAAR ) 50 MG tablet Take 50 mg by mouth daily. 06/24/23 07/24/23  [provider]  rosuvastatin  (CRESTOR ) 40 MG tablet Take 1 tablet (40 mg total) by mouth daily. 09/24/22   Audrene Lease, NP  sertraline  (ZOLOFT ) 25 MG tablet Take 25 mg by mouth daily. 06/15/23   [provider]      Allergies    Nsaids, Codeine, Lyrica [pregabalin],  and Penicillins    Review of Systems   Review of Systems  Respiratory:  Positive for shortness of breath.     Physical Exam Updated Vital Signs There were no vitals taken for this visit. Physical Exam Vitals and nursing note reviewed.  Constitutional:      Appearance: She is well-developed.  HENT:     Head: Normocephalic and atraumatic.  Cardiovascular:     Rate and Rhythm: Normal rate and regular rhythm.  Pulmonary:     Effort: No respiratory distress.     Breath sounds: No stridor. No decreased breath sounds, wheezing, rhonchi or rales.  Abdominal:     General: There is no distension.  Musculoskeletal:     Cervical back: Normal range of motion.     Right lower leg: No edema.     Left lower leg: No edema.  Skin:    General: Skin is warm and dry.     Coloration: Skin is pale.  Neurological:     Mental Status: She is alert.     ED Results / Procedures / Treatments   Labs (all labs ordered are listed, but only abnormal results are displayed) Labs Reviewed  CBC WITH DIFFERENTIAL/PLATELET  COMPREHENSIVE METABOLIC PANEL WITH GFR  PROTIME-INR  BRAIN NATRIURETIC PEPTIDE  I-STAT VENOUS BLOOD GAS, ED  TYPE AND SCREEN  TROPONIN I (HIGH SENSITIVITY)  EKG None  Radiology No results found.  Procedures Procedures    Medications Ordered in ED Medications - No data to display  ED Course/ Medical Decision Making/ A&P                                 Medical Decision Making Amount and/or Complexity of Data Reviewed Labs: ordered. Radiology: ordered. ECG/medicine tests: ordered.  Risk Prescription drug management.  Overall patient appears well although is pale so we will get type and screen along with cardiac workup.  If reassuring likely discharge. First troponin is negative.  As the patient recently had a stent occlusion in her brain while on Eliquis  thought it was prudent to get a PE study.  On my interpretation I do not see any obvious large PEs, pending  radiology read.  Patient's vital signs have been normal and stable throughout her stay here. At time of care transfer, pending second troponin and pe read. Also no temperature yet documented although clinically doesn't seem to have pneumonia.    Final Clinical Impression(s) / ED Diagnoses Final diagnoses:  None    Rx / DC Orders ED Discharge Orders     None         Wynne Jury, Reymundo Caulk, MD 09/17/23 [phone]

## 2023-09-17 NOTE — ED Provider Notes (Signed)
 Patient signed out to me at 0700 by Dr. Luberta Ruse pending repeat troponin and likely plan for admission.  In short this is a 78 year old female with a past medical history of hypertension, hyperlipidemia, CHF, COPD, dementia, DM, CKD, A fib and prior CVA with stent occlusion on Eliquis  that presented to the emergency department with shortness of breath.  Patient was hypertensive and otherwise hemodynamically stable on arrival in no acute respiratory distress.  EKG on arrival showed rate controlled A-fib without acute ischemic changes.  Patient had labs performed that showed a mildly elevated BNP, initial troponin is negative, hemoglobin is approximately at baseline.  Chest x-ray showed no acute disease.  Repeat troponin is pending at this time.  She had CT PE study performed that showed no PE but did show possible left atrial appendage thrombus as well as cardiomegaly with a small pericardial effusion as well as a lung nodule that had increased in size from last imaging.  With the patient forming new thrombus while on anticoagulation, she will be recommended admission.  8:20 AM Repeat troponin negative, patient reports improvement of symptoms, lungs are clear on auscultation. States she has been out of her Eliquis , not sure how long and states there is no one that helps with her meds that would know. Of note, was considered for Watchman at last cardiology visit in Oct 2024 and was given Eliquis  while in the hospital in March 2025.    Melissa [name], Melissa [name], Melissa [name] 09/17/23 [phone]

## 2023-09-17 NOTE — ED Triage Notes (Signed)
 Pr bibgcems from home. Pt woke up at 220am with difficulty breathing lung sounds clear with ems 98% RA with ems. In afib with hx of afib  142/92 Hr 72-92

## 2023-09-17 NOTE — TOC Benefit Eligibility Note (Signed)
 Pharmacy Patient Advocate Encounter  Insurance verification completed.    The patient is insured through [employer] . Patient has Medicare and is not eligible for a copay card, but may be able to apply for patient assistance or Medicare RX Payment Plan (Patient Must reach out to their plan, if eligible for payment plan), if available.    Ran test claim for Eliquis  5mg  and the current 30 day co-pay is $0.   This test claim was processed through [facility]- copay amounts may vary at other pharmacies due to [employer], or as the patient moves through the different stages of their insurance plan.

## 2023-09-17 NOTE — ED Notes (Signed)
 Bed bugs found on patient, patient deconed and clothes bagged and removed. Bed bug given to EVS.

## 2023-09-17 NOTE — Hospital Course (Addendum)
 Melissa [name] is a 78 y.o. female with PMHx left MCA infarct in May 2024, recently admitted this March with sub-occlusive stenosis of left M1 stent s/p revascularization, HTN, HLD, CHF, COPD, dementia, DM, A fib and anxiety/depression who was admitted for onset shortness of breath and found to have a left atrial appendage thrombus. Her hospital course is outlined by the pertinent problems below:  Acute Shortness of Breath Etiology uncertain, could have been related to ongoing Afib with intermittent RVR. Patient does have history of CHF and COPD, but presentation was not concerning for exacerbation of either given CXR without pulmonary edema or pleural effusion and was euvolemic on exam. She did not endorse recent cough, increased sputum production and pulmonary exam was unremarkable for tachypnea, increased work of breathing or wheezing and satted well on room air. Given these findings, additional respiratory support was not indicated.   Afib w/ intermittent RVR Left Atrial Appendage Thrombus Presented to ED in Afib with intermittent RVR. She was rate controlled on HD#2. She remained hemodynamically stable. CTA in the ED showed new left atrial appendage thrombus. Was being considered for Watchman last year, but ultimately proceed with Eliquis  only. Has not been taking Eliquis  for unknown period of time. She is able to afford the Eliquis , but lives with son who has epilepsy and neither the patient nor her son are able to drive. A family friend picks up her medications. Cardiology saw her, she is not candidate for TEE and at high risk for stroke with DCCV given Eliquis  non-adherence. Physical therapy saw her and recommended home health PT. Spoke with patients son and sister (who is her power of attorney) and discussed the importance of taking Eliquis  daily as well as close follow-up with her PCP within one week of discahrge.   Hx of left MCA infarct Recent subocclusive stenosis of left M1 stent Left MCA  infarct in 09/2022 s/p revascularization with TICI3 and left MCA stenting. Non-compliant with Eliquis . Physical exam without acute focal neurological findings. She was continued on her home Crestor  40 mg daily and Eliquis  5 mg BID.  Dementia Per son, patient has been having memory issues for over 2 years. Has not seen her primary care doctor in over 1.5 years. Patient has not gotten out of bed in about a month.   GERD Hx of Esophagitis Started on pantoprazole .  Hx of GI Bleed Microcytic Anemia  Has intermittent bloody stools outpatient. No hematochezia or melena since admission. Hgb stable ~8. Last colonoscopy in 2012 with mild sigmoid diverticulosis and internal hemorrhoids. Will need close outpatient follow-up given risk for further GI bleeding on Eliquis .   HTN Continued on home Losartan  50 mg daily and Amlodipine  5 mg daily.   COPD Continued on home albuterol .  Anxiety/depression Continued on home Zoloft  25 mg and hydroxyzine.   Bed bugs Bed bugs found on patient, she was de-clothed and clothes were placed in a bag.  INSTRUCTIONS  Melissa [name]   You were admitted to the hospital because you were having shortness of breath. In the Emergency Department, you were found to be in atrial fibrillation and imaging of your heart showed a blood clot. Your shortness of breath improved and you were restarted on your home anticoagulant (Eliquis ) to prevent further blood clotting.   You are now stable for discharge home with the following instructions: - Please take your Eliquis  every single day, you are at a very high risk for another stroke if not taken daily - Start taking pantoprazole   for your reflux  Now that you are taking Eliquis , please make sure to closely monitor your stools and if you have significant blood in your stools, to contact your PCP or come to the Emergency Department.   Follow-up with your primary care provider within one week.   Follow-up with Cardiology in 12  weeks for a repeat imaging of your heart.  Follow-up with Neurology on 10/13/2023.   If your symptoms return, please do not hesitate to call our clinic at [phone] or head to the nearest emergency department.  Take Care.  Joretta Newborn, MS4 Dr. Cathey Clunes, MD

## 2023-09-17 NOTE — ED Notes (Signed)
 Pt transported to CT ?

## 2023-09-18 ENCOUNTER — Other Ambulatory Visit (HOSPITAL_COMMUNITY): Payer: Self-pay

## 2023-09-18 DIAGNOSIS — I513 Intracardiac thrombosis, not elsewhere classified: Secondary | ICD-10-CM | POA: Diagnosis not present

## 2023-09-18 DIAGNOSIS — I5032 Chronic diastolic (congestive) heart failure: Secondary | ICD-10-CM | POA: Diagnosis not present

## 2023-09-18 DIAGNOSIS — R911 Solitary pulmonary nodule: Secondary | ICD-10-CM | POA: Diagnosis not present

## 2023-09-18 DIAGNOSIS — I4891 Unspecified atrial fibrillation: Secondary | ICD-10-CM | POA: Diagnosis not present

## 2023-09-18 LAB — BASIC METABOLIC PANEL WITH GFR
Anion gap: 7 (ref 5–15)
BUN: 8 mg/dL (ref 8–23)
CO2: 24 mmol/L (ref 22–32)
Calcium: 9 mg/dL (ref 8.9–10.3)
Chloride: 106 mmol/L (ref 98–111)
Creatinine, Ser: 0.86 mg/dL (ref 0.44–1.00)
GFR, Estimated: >60 mL/min (ref >60)
Glucose, Bld: 102 mg/dL — ABNORMAL HIGH (ref 70–99)
Potassium: 3.9 mmol/L (ref 3.5–5.1)
Sodium: 137 mmol/L (ref 135–145)

## 2023-09-18 LAB — CBC
HCT: 27.2 % — ABNORMAL LOW (ref 36.0–46.0)
Hemoglobin: 8.1 g/dL — ABNORMAL LOW (ref 12.0–15.0)
MCH: 21.4 pg — ABNORMAL LOW (ref 26.0–34.0)
MCHC: 29.8 g/dL — ABNORMAL LOW (ref 30.0–36.0)
MCV: 71.8 fL — ABNORMAL LOW (ref 80.0–100.0)
Platelets: 531 10*3/uL — ABNORMAL HIGH (ref 150–400)
RBC: 3.79 MIL/uL — ABNORMAL LOW (ref 3.87–5.11)
RDW: 19.2 % — ABNORMAL HIGH (ref 11.5–15.5)
WBC: 3.4 10*3/uL — ABNORMAL LOW (ref 4.0–10.5)
nRBC: 0 % (ref 0.0–0.2)

## 2023-09-18 MED ORDER — SODIUM CHLORIDE 0.9% FLUSH: 10.0000 mL | INTRAVENOUS | Status: DC | PRN

## 2023-09-18 MED ORDER — ROSUVASTATIN CALCIUM 40 MG PO TABS
40.0000 mg | ORAL_TABLET | Freq: Every day | ORAL | 0 refills | Status: AC
  Filled 2023-09-18: qty 90, 90d supply, fill #0

## 2023-09-18 MED ORDER — HYDROXYZINE HCL 10 MG PO TABS
10.0000 mg | ORAL_TABLET | Freq: Three times a day (TID) | ORAL | 0 refills | Status: AC | PRN
  Filled 2023-09-18: qty 30, 10d supply, fill #0

## 2023-09-18 MED ORDER — PANTOPRAZOLE SODIUM 40 MG PO TBEC
40.0000 mg | DELAYED_RELEASE_TABLET | Freq: Every day | ORAL | 11 refills | Status: AC
  Filled 2023-09-18: qty 30, 30d supply, fill #0

## 2023-09-18 MED ORDER — APIXABAN 5 MG PO TABS
5.0000 mg | ORAL_TABLET | Freq: Two times a day (BID) | ORAL | 3 refills | Status: AC
  Filled 2023-09-18: qty 60, 30d supply, fill #0

## 2023-09-18 MED ORDER — AMLODIPINE BESYLATE 10 MG PO TABS
5.0000 mg | ORAL_TABLET | Freq: Every day | ORAL | 0 refills | Status: AC
Start: 2023-09-18 — End: ?
  Filled 2023-09-18: qty 45, 90d supply, fill #0

## 2023-09-18 MED ORDER — PANTOPRAZOLE SODIUM 40 MG PO TBEC: 40.0000 mg | DELAYED_RELEASE_TABLET | Freq: Every day | ORAL | Status: DC

## 2023-09-18 NOTE — TOC Transition Note (Signed)
 Transition of Care (TOC) - Discharge Note Sherin Dingwall RN, BSN Transitions of Care Unit 4E- RN Case Manager See Treatment Team for direct phone #   Patient Details  Name: Melissa [name] MRN: [id number] Date of Birth: [birth year]  Transition of Care [facility]) CM/SW Contact:  Rox Cope, RN Phone Number: 09/18/2023, 2:17 PM   Clinical Narrative:    Pt stable for transition home today, order placed for HHPT as per PT recommendations.   CM in to speak with pt at bedside. List provided for [facility] choice Per CMS guidelines from [employer] website with star ratings (copy placed in shadow chart)- pt voiced she has used HH in past- does not remember name of agency- per review in [facility]- pt used [facility] in 2024- pt agreeable to use [facility] again or [facility] as Backup.   Address, phone # and PCP all confirmed. Pt voiced she has DME -RW at home.   Call made to [facility] liaison- referral has been accepted for HHPT- anticipate start of care early next week (Sun/Mon). They will contact pt to schedule.   Pt will need assistance with transportation. CSW has spoken with pt.    Final next level of care: Home w Home Health Services Barriers to Discharge: No Barriers Identified   Patient Goals and CMS Choice Patient states their goals for this hospitalization and ongoing recovery are:: return home CMS Medicare.gov Compare Post Acute Care list provided to:: Patient Choice offered to / list presented to : Patient      Discharge Placement               Home w/ [facility]        Discharge Plan and Services Additional resources added to the After Visit Summary for   In-house Referral: Clinical Social Work Discharge Planning Services: CM Consult Post Acute Care Choice: Home Health          DME Arranged: N/A DME Agency: NA       HH Arranged: PT HH Agency: [facility] Date HH Agency Contacted: 09/18/23 Time HH Agency Contacted: 1417 Representative spoke  with at [facility] Agency: Loetta Ringer  Social Drivers of Health (SDOH) Interventions SDOH Screenings   Food Insecurity: Food Insecurity Present (09/18/2023)  Housing: Low Risk  (09/18/2023)  Transportation Needs: No Transportation Needs (09/18/2023)  Recent Concern: Transportation Needs - Unmet Transportation Needs (06/22/2023)   [facility]  Utilities: Not At Risk (09/18/2023)  Financial Resource Strain: Medium Risk (06/22/2023)   Received from [facility] Care  Physical Activity: Inactive (06/22/2023)   Received [facility]  Social Connections: Socially Isolated (09/18/2023)  Stress: Stress Concern Present (06/22/2023)   Received from [facility]  Tobacco Use: Medium Risk (09/17/2023)  Health Literacy: Medium Risk (06/22/2023)   Received from [facility]     Readmission Risk Interventions     No data to display

## 2023-09-18 NOTE — TOC Progression Note (Addendum)
 Transition of Care [facility]) - Progression Note    Patient Details  Name: Melissa [name] MRN: [id number] Date of Birth: 1945-09-04  Transition of Care [facility]) CM/SW Contact  Valery Gaucher, [place] Phone Number: 09/18/2023, 3:26 PM  Clinical Narrative:     Updated- cancelled PTAR - due to patient already d/c to discharge lounge.   PTAR called -transporting to home address.  Liddie Reel, MSW, LCSW Clinical Social Worker    Expected Discharge Plan: Home w Home Health Services Barriers to Discharge: No Barriers Identified  Expected Discharge Plan and Services In-house Referral: Clinical Social Work Discharge Planning Services: CM Consult Post Acute Care Choice: Home Health Living arrangements for the past 2 months: Single Family Home Expected Discharge Date: 09/18/23               DME Arranged: N/A DME Agency: NA       HH Arranged: PT HH Agency: [facility] Date HH Agency Contacted: 09/18/23 Time HH Agency Contacted: 1417 Representative spoke with at [facility] Agency: Loetta Ringer   Social Determinants of Health (SDOH) Interventions SDOH Screenings   Food Insecurity: Food Insecurity Present (09/18/2023)  Housing: Low Risk  (09/18/2023)  Transportation Needs: No Transportation Needs (09/18/2023)  Recent Concern: Transportation Needs - Unmet Transportation Needs (06/22/2023)   Received from [facility]  Utilities: Not At Risk (09/18/2023)  Financial Resource Strain: Medium Risk (06/22/2023)   Received from [facility]  Physical Activity: Inactive (06/22/2023)   Received from [facility]  Social Connections: Socially Isolated (09/18/2023)  Stress: Stress Concern Present (06/22/2023)   Received from [facility]  Tobacco Use: Medium Risk (09/17/2023)  Health Literacy: Medium Risk (06/22/2023)   Received from [facility]    Readmission Risk Interventions     No data to display

## 2023-09-18 NOTE — Discharge Instructions (Addendum)
 INSTRUCTIONS  Melissa [name]   You were admitted to the hospital because you were having shortness of breath. In the Emergency Department, you were found to be in atrial fibrillation and imaging of your heart showed a blood clot. Your shortness of breath improved and you were restarted on your home anticoagulant (Eliquis ) to prevent further blood clotting.   You are now stable for discharge home with the following instructions: - Please take your Eliquis  (apixaban ) every single day, every 12 hours.  - You are at a very high risk for another stroke if not taken daily - Start taking pantoprazole  for your reflux  Now that you are taking Eliquis , please make sure to closely monitor your stools and if you have significant blood in your stools, to contact your PCP or come to the Emergency Department.   Follow-up with your primary care provider within one week.   Follow-up with Cardiology : 10/12/2023 at 8:00 AM at : [address], [place], [place] [address]. If you have to reschedule, call:  [phone]  Follow-up with Neurology on 10/13/2023.   If you have difficulty making it to your appointments, please call your medicare company, [employer], the number at the back of your insurance card, to inquire about transportation services to your appointments.  If your symptoms return, please do not hesitate to call our clinic at [phone] or head to the nearest emergency department.  Take Care.  Joretta Newborn, MS4 Dr. Cathey Clunes, MD

## 2023-09-18 NOTE — Progress Notes (Signed)
 Mobility Specialist Progress Note:    09/18/23 1025  Mobility  Activity Transferred from chair to bed  Level of Assistance Standby assist, set-up cues, supervision of patient - no hands on  Assistive Device None  Distance Ambulated (ft) 4 ft  Activity Response Tolerated well  Mobility Referral Yes  Mobility visit 1 Mobility  Mobility Specialist Start Time (ACUTE ONLY) 1025  Mobility Specialist Stop Time (ACUTE ONLY) 1033  Mobility Specialist Time Calculation (min) (ACUTE ONLY) 8 min   Pt received in chair, requesting assistance to transfer C>B. Required SBA with RW for safety. Tolerated well, asx throughout. Left pt in bed, all needs met, alarm on and call bell in reach.    Bonifacio Pruden Mobility Specialist Please contact via Special educational needs teacher or  Rehab office at [phone]

## 2023-09-18 NOTE — Evaluation (Signed)
 Physical Therapy Evaluation Patient Details Name: Melissa [name] MRN: [id number] DOB: 1946/01/12 Today's Date: 09/18/2023  History of Present Illness  Pt is a 78 y.o. female presenting to [facility] ED on 09/17/23 with dyspnea and an evaluation of A. Fib. Evaluation revealed throbmus of atrial appendage. PMH is significant for chronic HFpEF, stroke, permanent atrial fibrillation, dementia, HTN, COPD, DM.  Clinical Impression  Pt presents to evaluation with decreased mobility, decreased strength, and impaired balance, all limiting patient's ability to mobilize near baseline level of function. Pt was able to ambulate with RW and no physical assistance during session. Pt requires frequent cueing for walker management throughout session. PT will continue to treat patient while she is admitted. Recommending HHPT to address remaining mobility deficits and optimize return to prior level of function as pt is primary caregiver for her son.       If plan is discharge home, recommend the following: A little help with walking and/or transfers;A little help with bathing/dressing/bathroom;Assist for transportation;Help with stairs or ramp for entrance   Can travel by private vehicle        Equipment Recommendations None recommended by PT  Recommendations for Other Services       Functional Status Assessment Patient has had a recent decline in their functional status and demonstrates the ability to make significant improvements in function in a reasonable and predictable amount of time.     Precautions / Restrictions Precautions Precautions: Fall Recall of Precautions/Restrictions: Impaired (Pt reminded to call nursing for any mobilization needs) Restrictions Weight Bearing Restrictions Per Provider Order: No      Mobility  Bed Mobility Overal bed mobility: Modified Independent             General bed mobility comments: Pt performed supine > sit with HOB elevated and supervision.     Transfers Overall transfer level: Needs assistance Equipment used: Rolling walker (2 wheels) Transfers: Sit to/from Stand Sit to Stand: Contact guard assist           General transfer comment: Pt completed 1 STS from EOB w/ RW and CGA, and 1 STS from EOB and bedside chair with no AD and CGA-supervision. Pt requires frequent VC for sequencing and walker management.    Ambulation/Gait Ambulation/Gait assistance: Contact guard assist, Supervision Gait Distance (Feet): 250 Feet Assistive device: Rolling walker (2 wheels) Gait Pattern/deviations: Step-through pattern, Decreased stride length Gait velocity: decreased Gait velocity interpretation: <1.8 ft/sec, indicate of risk for recurrent falls   General Gait Details: Pt ambulates w/ RW and CGA-supervision. Pt demonstrates reciprocal gait pattern, decreased cadence and increased time to complete turns. Pt requires frequent VC for ambulating within RW.  Stairs            Wheelchair Mobility     Tilt Bed    Modified Rankin (Stroke Patients Only)       Balance Overall balance assessment: Needs assistance Sitting-balance support: Feet supported, No upper extremity supported Sitting balance-Leahy Scale: Good Sitting balance - Comments: Pt is able to sit EOB w/out UE support and supervision for several minutes.   Standing balance support: During functional activity, Bilateral upper extremity supported (Pt had several instances of no upper extremity support while ambulating with no losses of balance.) Standing balance-Leahy Scale: Good                               Pertinent Vitals/Pain Pain Assessment Pain Assessment: No/denies pain    Home  Living Family/patient expects to be discharged to:: Private residence Living Arrangements: Children Available Help at Discharge:  (pt reports son lives with her and is able to help if needed but he suffers from epileptic seizures) Type of Home: House Home Access:  Stairs to enter Entrance Stairs-Rails: Doctor, general practice of Steps: 5   Home Layout: One level Home Equipment: Agricultural consultant (2 wheels);Cane - single point;Wheelchair - manual;Shower seat      Prior Function Prior Level of Function : Independent/Modified Independent;History of Falls (last six months)             Mobility Comments: Pt reports ambulating with both a SPC and RW, but predominantly using RW for all mobilization since last hospital admittance. ADLs Comments: Pt reports independence with all ADLs. Pt does not drive.     Extremity/Trunk Assessment   Upper Extremity Assessment Upper Extremity Assessment: Overall WFL for tasks assessed    Lower Extremity Assessment Lower Extremity Assessment: Overall WFL for tasks assessed    Cervical / Trunk Assessment Cervical / Trunk Assessment: Kyphotic  Communication   Communication Communication: No apparent difficulties Factors Affecting Communication: Hearing impaired    Cognition Arousal: Alert Behavior During Therapy: Impulsive   PT - Cognitive impairments: History of cognitive impairments, Safety/Judgement                       PT - Cognition Comments: Pt had several instances of taking hands off RW to dance. Requires frequent cueing for walker management. Following commands: Intact (requires frequent verbal cueing for walker management)       Cueing Cueing Techniques: Verbal cues, Gestural cues, Visual cues     General Comments General comments (skin integrity, edema, etc.): no signs of acute distress. Sp02 remained in upper 90s throughout session.    Exercises     Assessment/Plan    PT Assessment Patient needs continued PT services  PT Problem List Decreased balance;Decreased strength;Decreased knowledge of use of DME;Decreased mobility;Decreased safety awareness       PT Treatment Interventions DME instruction;Gait training;Stair training;Functional mobility  training;Therapeutic activities;Therapeutic exercise;Balance training;Patient/family education    PT Goals (Current goals can be found in the Care Plan section)  Acute Rehab PT Goals Patient Stated Goal: to go home and be with her son PT Goal Formulation: With patient Time For Goal Achievement: 10/02/23 Potential to Achieve Goals: Good    Frequency Min 2X/week     Co-evaluation               AM-PAC PT "6 Clicks" Mobility  Outcome Measure Help needed turning from your back to your side while in a flat bed without using bedrails?: None Help needed moving from lying on your back to sitting on the side of a flat bed without using bedrails?: None Help needed moving to and from a bed to a chair (including a wheelchair)?: A Little Help needed standing up from a chair using your arms (e.g., wheelchair or bedside chair)?: A Little Help needed to walk in hospital room?: A Little Help needed climbing 3-5 steps with a railing? : A Little 6 Click Score: 20    End of Session Equipment Utilized During Treatment: Gait belt Activity Tolerance: Patient tolerated treatment well Patient left: in chair;with call bell/phone within reach;with chair alarm set Nurse Communication: Mobility status PT Visit Diagnosis: Repeated falls (R29.6);History of falling (Z91.81)    Time: 1610-9604 PT Time Calculation (min) (ACUTE ONLY): 29 min   Charges:   PT Evaluation $  PT Eval Low Complexity: 1 Low   PT General Charges $$ ACUTE PT VISIT: 1 Visit         Lonell Rives, SPT Acute Rehab [phone]   Lonell Rives 09/18/2023, 11:41 AM

## 2023-09-18 NOTE — Progress Notes (Signed)
   Subjective:  Mentating well this morning. Up and ambulating in the bathroom. Denies CP, palpitations or SOB. No questions for the team today.  Objective:  Vital signs in last 24 hours: Vitals:   09/17/23 1954 09/17/23 2332 09/18/23 0300 09/18/23 0500  BP: (!) 165/74 (!) 180/84 (!) 103/90   Pulse: 63 64 66   Resp: 18 16 18    Temp: 97.9 F (36.6 C) 98.2 F (36.8 C) 97.8 F (36.6 C)   TempSrc: Oral Oral Oral   SpO2: 100% 96% 97%   Weight:    69.5 kg  Height:    5\' 2"  (1.575 m)   Weight change:   Intake/Output Summary (Last 24 hours) at 09/18/2023 0617 Last data filed at 09/17/2023 2000 Gross per 24 hour  Intake 240 ml  Output --  Net 240 ml   Constitutional: Laying in bed, no acute distress HENT: normocephalic atraumatic Cardiovascular: Irregularly irregular, normal rate Pulmonary/Chest: Clear to auscultation anteriorly. Normal work of breathing on room air Abdominal: soft, non-tender, non-distended. Normal bowel sounds Neurological: alert & oriented x 3 Extremities: Warm and well perfused. Bilateral pedal pulses palpable. No LE edema. Psych: Normal mood and affect.  Assessment/Plan:  Principal Problem:   Thrombus of atrial appendage Active Problems:   Lung nodule  Afib w/ intermittent RVR Left Atrial Appendage Thrombus Presented to ED in Afib, now rate controlled. Is able to afford Eliquis , but has not been taking at home. Will speak to son today whom she lives with. Remains hemodynamically stable. Cardiology saw her, not candidate for TEE and high risk for stroke with DCCV given Eliquis  non-adherence. Will focus on building outpatient social system to ensure Eliquis  adherence. - TTE (5/15): unable to visualize thrombus, EF 65-70%, bilateral atrial enlargement - Continue home dose of Eliquis  5 mg BID  - Telemetry - Repeat Echo outpatient in 12 weeks - Social work consulted - PT consulted   Hx of left MCA infarct Recent subocclusive stenosis of left M1  stent Left MCA infarct in 09/2022 s/p revascularization with TICI3 and left MCA stenting. Non-compliant with Eliquis . Physical exam without acute focal neurological findings.  - Continue home Crestor  40 mg daily - Eliquis  5 mg BID  Hx of GI Bleed Microcytic Anemia Has intermittent bloody stools outpatient. No hematochezia or melena since admission. Hgb stable ~8. Last colonoscopy in 2012 with mild sigmoid diverticulosis and internal hemorrhoids. - Will need outpatient follow-up given on risk of further GI bleeding on Eliquis   GERD Hx of Esophagitis - Start pantoprazole    HTN - Continue home Losartan  50 mg daily - Continue Amlodipine  5 mg daily   COPD - Continue home albuterol   Anxiety/depression - Continue home Zoloft  25 mg and hydroxyzine   Bed bugs - Contact precautions   LOS: 0 days   Antero Derosia, Nana Avers, Medical Student 09/18/2023, 6:17 AM

## 2023-09-18 NOTE — Progress Notes (Signed)
 DISCHARGE NOTE HOME EVANNA HINDMAN to be discharged Home per MD order. Discussed prescriptions and follow up appointments with the patient. Prescriptions given to patient; medication list explained in detail. Patient verbalized understanding.  Skin clean, dry and intact without evidence of skin break down, no evidence of skin tears noted. IV catheter discontinued intact. Site without signs and symptoms of complications. Dressing and pressure applied. Pt denies pain at the site currently. No complaints noted.  Patient free of lines, drains, and wounds.   An After Visit Summary (AVS) was printed and given to the patient.   Patient escorted via wheelchair, and discharged home via cab.  Tonda Francisco, RN

## 2023-09-18 NOTE — TOC Progression Note (Signed)
 Transition of Care [facility]) - Progression Note    Patient Details  Name: Melissa [name] MRN: [id number] Date of Birth: 1946/04/23  [facility] CM/SW Contact  Valery Gaucher, [place] Phone Number: 09/18/2023, 2:05 PM  Clinical Narrative:     CSW informed by attending, patient needs transportation home. CSW met with patient at bedside. She states she has no transportation home. She reports her son does not drive. She states medications and groceries are delivered to her home. Spoke with patient's sister and states reports patient has no transportation to get home.   D/C lounge nurse states d/c lounge can accommodate patient's transportation needs  since she is d/c lounge appropriate.   TOC signing off at this time.  Liddie Reel, MSW, LCSW Clinical Social Worker         Expected Discharge Plan and Services         Expected Discharge Date: 09/18/23                                     Social Determinants of Health (SDOH) Interventions SDOH Screenings   Food Insecurity: Food Insecurity Present (09/18/2023)  Housing: Low Risk  (09/18/2023)  Transportation Needs: No Transportation Needs (09/18/2023)  Recent Concern: Transportation Needs - Unmet Transportation Needs (06/22/2023)   Received from [facility]  Utilities: Not At Risk (09/18/2023)  Financial Resource Strain: Medium Risk (06/22/2023)   Received from [facility]  Physical Activity: Inactive (06/22/2023)   Received from [facility]  Social Connections: Socially Isolated (09/18/2023)  Stress: Stress Concern Present (06/22/2023)   Received from [facility]  Tobacco Use: Medium Risk (09/17/2023)  Health Literacy: Medium Risk (06/22/2023)   Received from [facility]    Readmission Risk Interventions     No data to display

## 2023-09-18 NOTE — Progress Notes (Signed)
 Progress Note  Patient Name: Melissa [name] Date of Encounter: 09/18/2023  Primary Cardiologist: None   Subjective   Patient seen and examined at her bedside. She was sitting when I arrived.   Inpatient Medications    Scheduled Meds:  amLODipine   5 mg Oral Daily   apixaban   5 mg Oral BID   losartan   50 mg Oral Daily   rosuvastatin   40 mg Oral Daily   sertraline   25 mg Oral Daily   Continuous Infusions:  PRN Meds: acetaminophen  **OR** acetaminophen , albuterol , hydrOXYzine, sodium chloride  flush   Vital Signs    Vitals:   09/17/23 2332 09/18/23 0300 09/18/23 0500 09/18/23 0746  BP: (!) 180/84 (!) 103/90  (!) 176/93  Pulse: 64 66  66  Resp: 16 18  18   Temp: 98.2 F (36.8 C) 97.8 F (36.6 C)  97.6 F (36.4 C)  TempSrc: Oral Oral  Oral  SpO2: 96% 97%  96%  Weight:   69.5 kg   Height:   5\' 2"  (1.575 m)     Intake/Output Summary (Last 24 hours) at 09/18/2023 1057 Last data filed at 09/17/2023 2000 Gross per 24 hour  Intake 240 ml  Output --  Net 240 ml   Filed Weights   09/18/23 0500  Weight: 69.5 kg    Telemetry    Atrial fibrillation  with controlled ventricular rate - Personally Reviewed  ECG     None toady - Personally Reviewed  Physical Exam   General: Comfortable, sitting up in a chair Head: Atraumatic, normal size  Eyes: PEERLA, EOMI  Neck: Supple, normal JVD Cardiac: Normal S1, S2; RRR; no murmurs, rubs, or gallops Lungs: Clear to auscultation bilaterally Abd: Soft, nontender, no hepatomegaly  Ext: warm, no edema Neuro: Alert and oriented to person, place, time, and situation Psych: Poor insight   Labs    Chemistry Recent Labs  Lab 09/17/23 0332 09/17/23 0349 09/18/23 0530  NA 138 140 137  K 3.4* 3.4* 3.9  CL 104  --  106  CO2 24  --  24  GLUCOSE 99  --  102*  BUN 7*  --  8  CREATININE 1.00  --  0.86  CALCIUM  9.1  --  9.0  PROT 7.1  --   --   ALBUMIN 3.6  --   --   AST 17  --   --   ALT 12  --   --   ALKPHOS 87  --    --   BILITOT 0.5  --   --   GFRNONAA 57*  --  >60  ANIONGAP 10  --  7     Hematology Recent Labs  Lab 09/17/23 0332 09/17/23 0349 09/18/23 0530  WBC 4.9  --  3.4*  RBC 3.64*  --  3.79*  HGB 7.7* 8.8* 8.1*  HCT 26.8* 26.0* 27.2*  MCV 73.6*  --  71.8*  MCH 21.2*  --  21.4*  MCHC 28.7*  --  29.8*  RDW 19.2*  --  19.2*  PLT 496*  --  531*    Cardiac EnzymesNo results for input(s): "TROPONINI" in the last 168 hours. No results for input(s): "TROPIPOC" in the last 168 hours.   BNP Recent Labs  Lab 09/17/23 0332  BNP 172.1*     DDimer No results for input(s): "DDIMER" in the last 168 hours.   Radiology    ECHOCARDIOGRAM COMPLETE Result Date: 09/17/2023    ECHOCARDIOGRAM REPORT   Patient Name:  Melissa [name] Date of Exam: 09/17/2023 Medical Rec #:  [id number]        Height:       62.0 in Accession #:    [id number]       Weight:       153.2 lb Date of Birth:  [birth year]        BSA:          1.707 m Patient Age:    78 years         BP:           143/70 mmHg Patient Gender: F                HR:           46 bpm. Exam Location:  Inpatient Procedure: 2D Echo, Cardiac Doppler and Color Doppler (Both Spectral and Color            Flow Doppler were utilized during procedure). Indications:    Thrombus of the left atrial appendage  History:        Patient has prior history of Echocardiogram examinations. CHF,                 COPD and Stroke; Risk Factors:Hypertension.  Sonographer:    Willey Harrier Referring Phys: [id number] JEFFREY C HATCHER IMPRESSIONS  1. Left ventricular ejection fraction, by estimation, is 60 to 65%. The left ventricle has normal function. The left ventricle has no regional wall motion abnormalities. Left ventricular diastolic function could not be evaluated.  2. Right ventricular systolic function is normal. The right ventricular size is normal. There is normal pulmonary artery systolic pressure.  3. Right atrial size was moderately dilated.  4. Left atrial size was mildly  dilated.  5. The aortic valve is tricuspid. Aortic valve regurgitation is trivial. Aortic valve sclerosis/calcification is present, without any evidence of aortic stenosis. Aortic valve Vmax measures 1.53 m/s.  6. The mitral valve is normal in structure. Trivial mitral valve regurgitation. No evidence of mitral stenosis.  7. The inferior vena cava is dilated in size with >50% respiratory variability, suggesting right atrial pressure of 8 mmHg.  8. Cannot visualize left atrial appendage on 2D echo. Consider TEE or Chest CTA if there is a need to rule out LAA. FINDINGS  Left Ventricle: Left ventricular ejection fraction, by estimation, is 60 to 65%. The left ventricle has normal function. The left ventricle has no regional wall motion abnormalities. The left ventricular internal cavity size was normal in size. There is  no left ventricular hypertrophy. Left ventricular diastolic function could not be evaluated. Right Ventricle: The right ventricular size is normal. No increase in right ventricular wall thickness. Right ventricular systolic function is normal. There is normal pulmonary artery systolic pressure. The tricuspid regurgitant velocity is 1.77 m/s, and  with an assumed right atrial pressure of 8 mmHg, the estimated right ventricular systolic pressure is 20.5 mmHg. Left Atrium: Cannot visualize left atrial appendage on 2D echo. Consider TEE or Chest CTA if there is a need to rule out LAA. Left atrial size was mildly dilated. Right Atrium: Right atrial size was moderately dilated. Pericardium: There is no evidence of pericardial effusion. Mitral Valve: The mitral valve is normal in structure. Trivial mitral valve regurgitation. No evidence of mitral valve stenosis. MV peak gradient, 6.9 mmHg. The mean mitral valve gradient is 2.0 mmHg. Tricuspid Valve: The tricuspid valve is normal in structure. Tricuspid valve regurgitation is trivial. No evidence of tricuspid stenosis. Aortic Valve:  The aortic valve is  tricuspid. Aortic valve regurgitation is trivial. Aortic valve sclerosis/calcification is present, without any evidence of aortic stenosis. Aortic valve peak gradient measures 9.4 mmHg. Pulmonic Valve: The pulmonic valve was normal in structure. Pulmonic valve regurgitation is not visualized. No evidence of pulmonic stenosis. Aorta: The aortic root is normal in size and structure. Venous: The inferior vena cava is dilated in size with greater than 50% respiratory variability, suggesting right atrial pressure of 8 mmHg. IAS/Shunts: No atrial level shunt detected by color flow Doppler.  LEFT VENTRICLE PLAX 2D LVIDd:         4.20 cm LVIDs:         3.00 cm LV PW:         1.00 cm LV IVS:        1.00 cm LVOT diam:     2.00 cm LV SV:         62 LV SV Index:   36 LVOT Area:     3.14 cm  RIGHT VENTRICLE          IVC RV Basal diam:  3.90 cm  IVC diam: 2.40 cm LEFT ATRIUM             Index        RIGHT ATRIUM           Index LA Vol (A2C):   80.1 ml 46.92 ml/m  RA Area:     20.80 cm LA Vol (A4C):   48.3 ml 28.29 ml/m  RA Volume:   64.30 ml  37.67 ml/m LA Biplane Vol: 64.0 ml 37.49 ml/m  AORTIC VALVE AV Area (Vmax): 1.92 cm AV Vmax:        153.33 cm/s AV Peak Grad:   9.4 mmHg LVOT Vmax:      93.87 cm/s LVOT Vmean:     64.033 cm/s LVOT VTI:       0.198 m  AORTA Ao Root diam: 2.90 cm Ao Asc diam:  3.50 cm MITRAL VALVE              TRICUSPID VALVE MV Area (PHT): 2.14 cm   TR Peak grad:   12.5 mmHg MV Area VTI:   1.57 cm   TR Vmax:        177.00 cm/s MV Peak grad:  6.9 mmHg MV Mean grad:  2.0 mmHg   SHUNTS MV Vmax:       1.31 m/s   Systemic VTI:  0.20 m MV Vmean:      63.1 cm/s  Systemic Diam: 2.00 cm MV Decel Time: 354 msec MV E velocity: 1.26 cm/s Gaylyn Keas MD Electronically signed by Gaylyn Keas MD Signature Date/Time: 09/17/2023/1:58:31 PM    Final    CT Angio Chest PE W and/or Wo Contrast Result Date: 09/17/2023 CLINICAL DATA:  Shortness of breath. Suspect pulmonary embolism. D-dimer level unknown. EXAM: CT  ANGIOGRAPHY CHEST WITH CONTRAST TECHNIQUE: Multidetector CT imaging of the chest was performed using the standard protocol during bolus administration of intravenous contrast. Multiplanar CT image reconstructions and MIPs were obtained to evaluate the vascular anatomy. RADIATION DOSE REDUCTION: This exam was performed according to the departmental dose-optimization program which includes automated exposure control, adjustment of the mA and/or kV according to patient size and/or use of iterative reconstruction technique. CONTRAST:  75mL OMNIPAQUE  IOHEXOL  350 MG/ML SOLN COMPARISON:  Portable chest today, portable chest 09/14/2023, CT chest, abdomen and pelvis with contrast 06/29/2023, CTA chest 01/27/2022. FINDINGS: Cardiovascular: The heart is moderately enlarged,  more so than 2 months ago. Small pericardial effusion collects to the right and inferiorly, unchanged. There is a 1 cm hypodensity in the left atrial appendage most likely due to a thrombus, not seen on the last CT. There are left main and three-vessel coronary artery calcifications, greatest in the LAD and proximal to mid right coronary arteries. Central pulmonary veins are prominent without overt edema. Pulmonary arteries are upper limits of normal caliber with diagnostic opacification and no visible embolus. There is moderate patchy aortic calcific plaque, mild scattered calcification in the great vessels and no aneurysm, stenosis or dissection. Tortuosity in the descending aorta. Mediastinum/Nodes: Large hiatal hernia, the stomach majority intrathoracic. Unremarkable thoracic esophagus. No thyroid  or axillary mass or intrathoracic adenopathy are seen. The trachea is patent. Lungs/Pleura: There is diffuse bronchial thickening in the lower lobes. Linear atelectasis in the medial left lower lobe alongside the hiatal hernia. Again, a subsolid anterior segment right upper lobe ill-defined nodule is again noted measures 1.3 x 0.9 cm, slightly larger than when  seen on earlier chest CT from 04/25/2020. Recommend PET-CT or tissue sampling. Findings suspicious for adenocarcinoma. There is a stable partially pleural-based, roughly rectangular ground-glass medial right apical nodule measuring 10 x 0.7 cm on 6:19, unchanged. Rest of the lungs are generally clear. There is no pleural effusion, thickening or pneumothorax. Upper Abdomen: Polycystic kidneys. No acute abnormality. Status post cholecystectomy. Abdominal aortic atherosclerosis. Musculoskeletal: Osteopenia, degenerative changes and slight kyphodextroscoliosis thoracic spine. No acute or significant osseous findings. No mass in the chest wall. Review of the MIP images confirms the above findings. IMPRESSION: 1. No evidence of arterial dilatation or embolus. 2. 1 cm hypodensity in the left atrial appendage most likely due to a thrombus, not seen on the last CT. 3. Moderate cardiomegaly, more so than 2 months ago. Small pericardial effusion. 4. Aortic and coronary artery atherosclerosis. 5. 1.3 x 0.9 cm subsolid anterior segment right upper lobe nodule, slightly larger than when seen on earlier chest CT from 04/25/2020. Recommend PET-CT or tissue sampling. Findings suspicious for adenocarcinoma. 6. 10 x 0.7 cm ground-glass medial right apical nodule, unchanged. 7. Large hiatal hernia, the stomach majority intrathoracic. 8. Bronchitis. 9. Polycystic kidneys. 10. Osteopenia and degenerative change. Aortic Atherosclerosis (ICD10-I70.0). Electronically Signed   By: Denman Fischer M.D.   On: 09/17/2023 06:41   DG Chest Portable 1 View Result Date: 09/17/2023 CLINICAL DATA:  Dyspnea EXAM: PORTABLE CHEST 1 VIEW COMPARISON:  09/14/2023 FINDINGS: Lungs are clear. No pneumothorax or pleural effusion. Stable cardiomegaly. Or a vascularity is normal. No acute bone abnormality. IMPRESSION: 1. No active disease. Stable cardiomegaly. Electronically Signed   By: Worthy Heads M.D.   On: 09/17/2023 03:53    Cardiac Studies      Patient Profile     78 y.o. 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 We have to watch this closely because she was hospitalized multiple time in the past for anemia and noted GI bleed. However has since tolerated Eliquis . Her last visit with our practice was a discussion  for watchman.   There is no need for further imaging ( TEE) as this will not change her plan of care for use if anticoagulation.   Her biggest issue seems to be her social support. Will defer to the primary team for help with navigating this       For questions or updates, please contact [facility] Please consult [email or web address] for contact info under Cardiology/STEMI.      Signed, Arayna Illescas, DO  09/18/2023, 10:57 AM

## 2023-09-18 NOTE — Progress Notes (Signed)
 Physical Therapy Quick Note  PT has completed initial evaluation.    Overall, patient at supervision assistance level.   PT Follow up recommended: Home Health PT Equipment recommended:  None recommended Complete evaluation note to follow.     Arlyss Gandy, PT, DPT Acute Rehabilitation Office [phone]

## 2023-09-18 NOTE — Plan of Care (Signed)

## 2023-09-18 NOTE — Discharge Summary (Signed)
 Name: Melissa [name] MRN: [id number] DOB: 05-06-1945 78 y.o. PCP: Lonni Robert, MD  Date of Admission: 09/17/2023  3:20 AM Date of Discharge: 09/18/2023 2:02 PM Attending Physician: Dr. Alwin Baars  Discharge Diagnosis: Principal Problem:   Thrombus of atrial appendage Active Problems:   Lung nodule    Discharge Medications: Allergies as of 09/18/2023       Reactions   Nsaids Other (See Comments)   CAN TAKE ONLY TYLENOL - NOTHING ELSE!!!!!!   Codeine Nausea And Vomiting, Other (See Comments)   GI Intolerance   Lyrica [pregabalin] Other (See Comments)   Reaction not known at this time   Penicillins Other (See Comments)   Childhood allergy- exact reaction not noted        Medication List     PAUSE taking these medications    losartan  50 MG tablet Wait to take this until your doctor or other care provider tells you to start again. Commonly known as: COZAAR  Take 50 mg by mouth daily.       TAKE these medications    albuterol  108 (90 Base) MCG/ACT inhaler Commonly known as: VENTOLIN  HFA Inhale 2 puffs into the lungs every 6 (six) hours as needed for wheezing or shortness of breath.   amLODipine  10 MG tablet Commonly known as: NORVASC  Take 0.5 tablets (5 mg total) by mouth daily. What changed: medication strength   apixaban  5 MG Tabs tablet Commonly known as: ELIQUIS  Take 1 tablet (5 mg total) by mouth 2 (two) times daily.   docusate sodium  100 MG capsule Commonly known as: COLACE Take 1 capsule (100 mg total) by mouth daily.   feeding supplement Liqd Take 237 mLs by mouth 2 (two) times daily between meals.   hydrOXYzine 10 MG tablet Commonly known as: ATARAX Take 1 tablet (10 mg total) by mouth 3 (three) times daily as needed for anxiety. What changed:  when to take this reasons to take this   pantoprazole  40 MG tablet Commonly known as: PROTONIX  Take 1 tablet (40 mg total) by mouth daily.   rosuvastatin  40 MG tablet Commonly known as:  CRESTOR  Take 1 tablet (40 mg total) by mouth daily.   sertraline  25 MG tablet Commonly known as: ZOLOFT  Take 25 mg by mouth daily.        Disposition and follow-up:   Ms.Samiyyah A Piech was discharged from [facility] in [place] condition.  At the hospital follow up visit please address:  1.  Follow-up:  *L atrial appendage thrombus *Atrial fibrillation - Not a candidate for TTE or cardioversion - Ensure adherence to Eliquis  5 mg BID - Follow up with Cardiology on 10/12/2023 8:30 AM  *Cognitive impairment *Functional deconditioning - Progressive over the past 2 years - Son, Dee Farber, and sister Ever Hiss, help with care - Home health PT ordered, ensure they have established care with patient  *Hypertension - Increased Amlodipine  to 10 mg  - Held Losartan  as patient has not been taking medication since 2024; assess and titrate accordingly  *MCA infarct with stent stenosis S/p revascularization - Ensure adherence to Crestor , ASA - Monitor for changes in neurologic exam - Monitor for signs of bleeding  *History of GI bleed -Will need close CBC follow up with medications above  2.  Labs / imaging needed at time of follow-up: CBC, BMP  3.  Pending labs/ test needing follow-up: None  Follow-up Appointments:  Lonni Robert, MD PCP - General Family Medicine [phone] [address] [place]  [place] [address]     Next Steps: Schedule an appointment as soon as possible for a visit in 1 week(s)   Carie Charity, NP  Cardiology [phone] [address] [place] [address]    Next Steps: Go on 10/12/2023 Instructions: at [facility] Course by problem list: SHAMYLA GRENNAN is a 78 y.o. female with PMHx left MCA infarct in May 2024, recently admitted this March with sub-occlusive stenosis of left M1 stent s/p revascularization, HTN, HLD, CHF, COPD, dementia, DM, A fib with poor adherence to Eliquis  due to memory  decline, and anxiety/depression who was admitted for onset shortness of breath and found to be in atrial fibrillation with rapid ventricular response and a left atrial appendage thrombus. Her hospital course is outlined by the pertinent problems below:  L atrial appendage thrombus Atrial fibrillation Initially presented with dyspnea. Likely in the setting of atrial fibrillation with RVR. No reduced EF on TTE and no evidence of PE on CT chest angiography. However,  CTA did show new left atrial appendage thrombus. Was being considered for Watchman last year, but ultimately proceed with Eliquis  only. Has not been taking Eliquis  for unknown period of time. She is able to afford the Eliquis , but lives with son who has epilepsy and neither the patient nor her son are able to drive. A family friend picks up her medications. Cardiology saw her, she is not candidate for TEE and at high risk for stroke with DCCV given Eliquis  non-adherence. She is currently stable. Physical therapy saw her and recommended home health PT. Spoke with patients son and sister, Ever Hiss, and discussed the importance of taking Eliquis  daily as well as close follow-up with her PCP within one week of discharge. She will also have cardiology follow up in the upcoming weeks.   Hx of left MCA infarct Recent subocclusive stenosis of left M1 stent Left MCA infarct in 09/2022 s/p revascularization with TICI3 and left MCA stenting. Non-compliant with Eliquis . Physical exam without acute focal neurological findings. She was continued on her home Crestor  40 mg daily and Eliquis  5 mg BID.  Dementia Per son, patient has been having memory issues for over 2 years. Has not seen her primary care doctor in over 1.5 years. Patient has not gotten out of bed in about a month. This patient will likely need further care coordination and support through PCP and [facility] services. Discussed with both patient's son and sister.  GERD Hx of  Esophagitis Started on pantoprazole ; continued at discharge  Hx of GI Bleed Microcytic Anemia  Has intermittent bloody stools outpatient. No hematochezia or melena since admission. Hgb stable ~8. Last colonoscopy in 2012 with mild sigmoid diverticulosis and internal hemorrhoids. Will need close outpatient follow-up given risk for further GI bleeding on Eliquis .   HTN Continued on home Losartan  50 mg daily and Amlodipine  5 mg daily.   COPD Continued on home albuterol .  Anxiety/depression Continued on home Zoloft  25 mg and hydroxyzine.   Bed bugs Bed bugs found on patient, she was de-clothed and clothes were placed in a bag.   Pertinent Labs, Studies, and Procedures:     Latest Ref Rng & Units 09/18/2023    5:30 AM 09/17/2023    3:49 AM 09/17/2023    3:32 AM  CBC  WBC 4.0 - 10.5 K/uL 3.4   4.9   Hemoglobin 12.0 - 15.0 g/dL 8.1  8.8  7.7   Hematocrit 36.0 -  46.0 % 27.2  26.0  26.8   Platelets 150 - 400 K/uL 531   496        Latest Ref Rng & Units 09/18/2023    5:30 AM 09/17/2023    3:49 AM 09/17/2023    3:32 AM  CMP  Glucose 70 - 99 mg/dL 191   99   BUN 8 - 23 mg/dL 8   7   Creatinine 4.78 - 1.00 mg/dL 2.95   6.21   Sodium 308 - 145 mmol/L 137  140  138   Potassium 3.5 - 5.1 mmol/L 3.9  3.4  3.4   Chloride 98 - 111 mmol/L 106   104   CO2 22 - 32 mmol/L 24   24   Calcium  8.9 - 10.3 mg/dL 9.0   9.1   Total Protein 6.5 - 8.1 g/dL   7.1   Total Bilirubin 0.0 - 1.2 mg/dL   0.5   Alkaline Phos 38 - 126 U/L   87   AST 15 - 41 U/L   17   ALT 0 - 44 U/L   12     ECHOCARDIOGRAM COMPLETE Result Date: 09/17/2023    ECHOCARDIOGRAM REPORT   Patient Name:   SHAWNEEN KUKLINSKI Date of Exam: 09/17/2023 Medical Rec #:  [id number]        Height:       62.0 in Accession #:    [id number]       Weight:       153.2 lb Date of Birth:  09-11-1945        BSA:          1.707 m Patient Age:    78 years         BP:           143/70 mmHg Patient Gender: F                HR:           46 bpm. Exam  Location:  Inpatient Procedure: 2D Echo, Cardiac Doppler and Color Doppler (Both Spectral and Color            Flow Doppler were utilized during procedure). Indications:    Thrombus of the left atrial appendage  History:        Patient has prior history of Echocardiogram examinations. CHF,                 COPD and Stroke; Risk Factors:Hypertension.  Sonographer:    Willey Harrier Referring Phys: [id number] JEFFREY C HATCHER IMPRESSIONS  1. Left ventricular ejection fraction, by estimation, is 60 to 65%. The left ventricle has normal function. The left ventricle has no regional wall motion abnormalities. Left ventricular diastolic function could not be evaluated.  2. Right ventricular systolic function is normal. The right ventricular size is normal. There is normal pulmonary artery systolic pressure.  3. Right atrial size was moderately dilated.  4. Left atrial size was mildly dilated.  5. The aortic valve is tricuspid. Aortic valve regurgitation is trivial. Aortic valve sclerosis/calcification is present, without any evidence of aortic stenosis. Aortic valve Vmax measures 1.53 m/s.  6. The mitral valve is normal in structure. Trivial mitral valve regurgitation. No evidence of mitral stenosis.  7. The inferior vena cava is dilated in size with >50% respiratory variability, suggesting right atrial pressure of 8 mmHg.  8. Cannot visualize left atrial appendage on 2D echo. Consider TEE or Chest CTA if there is a need  to rule out LAA. FINDINGS  Left Ventricle: Left ventricular ejection fraction, by estimation, is 60 to 65%. The left ventricle has normal function. The left ventricle has no regional wall motion abnormalities. The left ventricular internal cavity size was normal in size. There is  no left ventricular hypertrophy. Left ventricular diastolic function could not be evaluated. Right Ventricle: The right ventricular size is normal. No increase in right ventricular wall thickness. Right ventricular systolic function is  normal. There is normal pulmonary artery systolic pressure. The tricuspid regurgitant velocity is 1.77 m/s, and  with an assumed right atrial pressure of 8 mmHg, the estimated right ventricular systolic pressure is 20.5 mmHg. Left Atrium: Cannot visualize left atrial appendage on 2D echo. Consider TEE or Chest CTA if there is a need to rule out LAA. Left atrial size was mildly dilated. Right Atrium: Right atrial size was moderately dilated. Pericardium: There is no evidence of pericardial effusion. Mitral Valve: The mitral valve is normal in structure. Trivial mitral valve regurgitation. No evidence of mitral valve stenosis. MV peak gradient, 6.9 mmHg. The mean mitral valve gradient is 2.0 mmHg. Tricuspid Valve: The tricuspid valve is normal in structure. Tricuspid valve regurgitation is trivial. No evidence of tricuspid stenosis. Aortic Valve: The aortic valve is tricuspid. Aortic valve regurgitation is trivial. Aortic valve sclerosis/calcification is present, without any evidence of aortic stenosis. Aortic valve peak gradient measures 9.4 mmHg. Pulmonic Valve: The pulmonic valve was normal in structure. Pulmonic valve regurgitation is not visualized. No evidence of pulmonic stenosis. Aorta: The aortic root is normal in size and structure. Venous: The inferior vena cava is dilated in size with greater than 50% respiratory variability, suggesting right atrial pressure of 8 mmHg. IAS/Shunts: No atrial level shunt detected by color flow Doppler.  LEFT VENTRICLE PLAX 2D LVIDd:         4.20 cm LVIDs:         3.00 cm LV PW:         1.00 cm LV IVS:        1.00 cm LVOT diam:     2.00 cm LV SV:         62 LV SV Index:   36 LVOT Area:     3.14 cm  RIGHT VENTRICLE          IVC RV Basal diam:  3.90 cm  IVC diam: 2.40 cm LEFT ATRIUM             Index        RIGHT ATRIUM           Index LA Vol (A2C):   80.1 ml 46.92 ml/m  RA Area:     20.80 cm LA Vol (A4C):   48.3 ml 28.29 ml/m  RA Volume:   64.30 ml  37.67 ml/m LA Biplane  Vol: 64.0 ml 37.49 ml/m  AORTIC VALVE AV Area (Vmax): 1.92 cm AV Vmax:        153.33 cm/s AV Peak Grad:   9.4 mmHg LVOT Vmax:      93.87 cm/s LVOT Vmean:     64.033 cm/s LVOT VTI:       0.198 m  AORTA Ao Root diam: 2.90 cm Ao Asc diam:  3.50 cm MITRAL VALVE              TRICUSPID VALVE MV Area (PHT): 2.14 cm   TR Peak grad:   12.5 mmHg MV Area VTI:   1.57 cm   TR Vmax:  177.00 cm/s MV Peak grad:  6.9 mmHg MV Mean grad:  2.0 mmHg   SHUNTS MV Vmax:       1.31 m/s   Systemic VTI:  0.20 m MV Vmean:      63.1 cm/s  Systemic Diam: 2.00 cm MV Decel Time: 354 msec MV E velocity: 1.26 cm/s Gaylyn Keas MD Electronically signed by Gaylyn Keas MD Signature Date/Time: 09/17/2023/1:58:31 PM    Final    CT Angio Chest PE W and/or Wo Contrast Result Date: 09/17/2023 CLINICAL DATA:  Shortness of breath. Suspect pulmonary embolism. D-dimer level unknown. EXAM: CT ANGIOGRAPHY CHEST WITH CONTRAST TECHNIQUE: Multidetector CT imaging of the chest was performed using the standard protocol during bolus administration of intravenous contrast. Multiplanar CT image reconstructions and MIPs were obtained to evaluate the vascular anatomy. RADIATION DOSE REDUCTION: This exam was performed according to the departmental dose-optimization program which includes automated exposure control, adjustment of the mA and/or kV according to patient size and/or use of iterative reconstruction technique. CONTRAST:  75mL OMNIPAQUE  IOHEXOL  350 MG/ML SOLN COMPARISON:  Portable chest today, portable chest 09/14/2023, CT chest, abdomen and pelvis with contrast 06/29/2023, CTA chest 01/27/2022. FINDINGS: Cardiovascular: The heart is moderately enlarged, more so than 2 months ago. Small pericardial effusion collects to the right and inferiorly, unchanged. There is a 1 cm hypodensity in the left atrial appendage most likely due to a thrombus, not seen on the last CT. There are left main and three-vessel coronary artery calcifications, greatest in the  LAD and proximal to mid right coronary arteries. Central pulmonary veins are prominent without overt edema. Pulmonary arteries are upper limits of normal caliber with diagnostic opacification and no visible embolus. There is moderate patchy aortic calcific plaque, mild scattered calcification in the great vessels and no aneurysm, stenosis or dissection. Tortuosity in the descending aorta. Mediastinum/Nodes: Large hiatal hernia, the stomach majority intrathoracic. Unremarkable thoracic esophagus. No thyroid  or axillary mass or intrathoracic adenopathy are seen. The trachea is patent. Lungs/Pleura: There is diffuse bronchial thickening in the lower lobes. Linear atelectasis in the medial left lower lobe alongside the hiatal hernia. Again, a subsolid anterior segment right upper lobe ill-defined nodule is again noted measures 1.3 x 0.9 cm, slightly larger than when seen on earlier chest CT from 04/25/2020. Recommend PET-CT or tissue sampling. Findings suspicious for adenocarcinoma. There is a stable partially pleural-based, roughly rectangular ground-glass medial right apical nodule measuring 10 x 0.7 cm on 6:19, unchanged. Rest of the lungs are generally clear. There is no pleural effusion, thickening or pneumothorax. Upper Abdomen: Polycystic kidneys. No acute abnormality. Status post cholecystectomy. Abdominal aortic atherosclerosis. Musculoskeletal: Osteopenia, degenerative changes and slight kyphodextroscoliosis thoracic spine. No acute or significant osseous findings. No mass in the chest wall. Review of the MIP images confirms the above findings. IMPRESSION: 1. No evidence of arterial dilatation or embolus. 2. 1 cm hypodensity in the left atrial appendage most likely due to a thrombus, not seen on the last CT. 3. Moderate cardiomegaly, more so than 2 months ago. Small pericardial effusion. 4. Aortic and coronary artery atherosclerosis. 5. 1.3 x 0.9 cm subsolid anterior segment right upper lobe nodule, slightly  larger than when seen on earlier chest CT from 04/25/2020. Recommend PET-CT or tissue sampling. Findings suspicious for adenocarcinoma. 6. 10 x 0.7 cm ground-glass medial right apical nodule, unchanged. 7. Large hiatal hernia, the stomach majority intrathoracic. 8. Bronchitis. 9. Polycystic kidneys. 10. Osteopenia and degenerative change. Aortic Atherosclerosis (ICD10-I70.0). Electronically Signed   By: Denman Fischer  M.D.   On: 09/17/2023 06:41   DG Chest Portable 1 View Result Date: 09/17/2023 CLINICAL DATA:  Dyspnea EXAM: PORTABLE CHEST 1 VIEW COMPARISON:  09/14/2023 FINDINGS: Lungs are clear. No pneumothorax or pleural effusion. Stable cardiomegaly. Or a vascularity is normal. No acute bone abnormality. IMPRESSION: 1. No active disease. Stable cardiomegaly. Electronically Signed   By: Worthy Heads M.D.   On: 09/17/2023 03:53     Discharge Instructions: Discharge Instructions     Call MD for:  difficulty breathing, headache or visual disturbances   Complete by: As directed    Call MD for:  extreme fatigue   Complete by: As directed    Call MD for:  persistant dizziness or light-headedness   Complete by: As directed    Call MD for:  persistant nausea and vomiting   Complete by: As directed    Call MD for:  severe uncontrolled pain   Complete by: As directed    Call MD for:  temperature >100.4   Complete by: As directed    Diet - low sodium heart healthy   Complete by: As directed    Discharge instructions   Complete by: As directed    INSTRUCTIONS  Angelia Barcelona were admitted to the hospital because you were having shortness of breath. In the Emergency Department, you were found to be in atrial fibrillation and imaging of your heart showed a blood clot. Your shortness of breath improved and you were restarted on your home anticoagulant (Eliquis ) to prevent further blood clotting.   You are now stable for discharge home with the following instructions: - Please take your  Eliquis  (apixaban ) every single day, every 12 hours.  - You are at a very high risk for another stroke if not taken daily - Start taking pantoprazole  for your reflux  Now that you are taking Eliquis , please make sure to closely monitor your stools and if you have significant blood in your stools, to contact your PCP or come to the Emergency Department.   Follow-up with your primary care provider within one week.   Follow-up with Cardiology : 10/12/2023 at 8:00 AM at : [address], [place], [place] [address]. If you have to reschedule, call:  [phone]  Follow-up with Neurology on 10/13/2023.   If you have difficulty making it to your appointments, please call your medicare company, [employer], the number at the back of your insurance card, to inquire about transportation services to your appointments.  If your symptoms return, please do not hesitate to call our clinic at [phone] or head to the nearest emergency department.  Take Care.  Joretta Newborn, MS4 Dr. Cathey Clunes, MD   Face-to-face encounter (required for Medicare/Medicaid patients)   Complete by: As directed    I Cathey Clunes certify that this patient is under my care and that I, or a nurse practitioner or physician's assistant working with me, had a face-to-face encounter that meets the physician face-to-face encounter requirements with this patient on 09/18/2023. The encounter with the patient was in whole, or in part for the following medical condition(s) which is the primary reason for home health care (List medical condition): funcional deconditioning and high risk for falls   The encounter with the patient was in whole, or in part, for the following medical condition, which is the primary reason for home health care: funcional deconditioning and high risk for falls   I certify that, based on my findings, the  following services are medically necessary home health services:  Physical therapy   Reason for Medically Necessary Home Health Services: Therapy- Home Adaptation to Facilitate Safety   My clinical findings support the need for the above services:  Unsafe ambulation due to balance issues Cognitive impairments, dementia, or mental confusion  that make it unsafe to leave home     Further, I certify that my clinical findings support that this patient is homebound due to:  Unsafe ambulation due to balance issues Unable to leave home safely without assistance     Home Health   Complete by: As directed    Face to face   To provide the following care/treatments: PT   Increase activity slowly   Complete by: As directed        Signed: Marieta Shorten, MS4 Cathey Clunes, MD [facility] - PGY2 Pager: [phone] 09/18/2023, 2:02 PM    Please contact the on call pager after 5 pm and on weekends at [phone].

## 2023-09-19 ENCOUNTER — Telehealth: Payer: Self-pay | Admitting: Emergency Medicine

## 2023-09-19 NOTE — Telephone Encounter (Signed)
 Patient needs to be in a nodule slot or blocked slot. Thanks.

## 2023-09-21 NOTE — Telephone Encounter (Signed)
 Copied from CRM [id number]. Topic: Appointments - Scheduling Inquiry for Clinic >> Sep 21, 2023  8:33 AM [name] wrote: Reason for CRM: Patient returning phone call from [place], per chart. Denson Flake, MD   09/19/23 12:48 PM Note: Patient needs to be in a nodule slot or blocked slot. Thanks.  Spoke with Melissa [name]. Pt states she has no transportation & is unable to walk. She states there is no way for her to make it to an office visit.  Dr. Baldwin Levee would you be OK with virtual visit?

## 2023-09-21 NOTE — Telephone Encounter (Signed)
 ATC x1- left vm to call back

## 2023-09-22 NOTE — Telephone Encounter (Signed)
 It would be quite difficult for me to see her as a new consult to decide next steps regarding her with a virtual visit, but we could do so if there are absolutely no other options.  Given her overall debilitated state, other medical issues, I suspect that we will not be able to arrange for navigational bronchoscopy and biopsy of a slowly enlarging right upper lobe pulmonary nodule.  She may be a candidate for empiric SBRT.  I will focus my discussions on this when we meet. I would like to see her in person, but if a virtual visit is her only choice then we can try to make that work.

## 2023-09-22 NOTE — Telephone Encounter (Signed)
 Spoke with Melissa [name]. Pt has been scheduled for blocked spot on 6/11 per RB. Pt is aware that it is in person. Pt advised she is going to try to find someone to bring her to appt and if unable she will call office to change to virtual.  Pt requested I send letter stating this information.  Sending letter out & pt is aware of information mentioned on phone call.  NFN at this time.

## 2023-10-09 NOTE — Progress Notes (Deleted)
 Cardiology Clinic Note   Patient Name: Melissa [name] Date of Encounter: 10/09/2023  Primary Care Provider:  Lonni Robert, MD Primary Cardiologist:  None  Patient Profile    Melissa [name] 78 year old female presents to the clinic today for follow-up evaluation of her CHF and coronary artery disease.  Past Medical History    Past Medical History:  Diagnosis Date   Anemia    Anxiety    Arthritis    "all over" (12/08/2017)   Asthma    CHF (congestive heart failure) (HCC)    Chronic lower back pain    COPD (chronic obstructive pulmonary disease) (HCC)    Dementia (HCC)    Depression    Diabetes mellitus without complication (HCC)    Fibromyalgia    Gastric ulcer    GERD (gastroesophageal reflux disease)    Guillain Barr syndrome (HCC)    Hematemesis 12/07/2017   History of blood transfusion    "low blood" (12/08/2017)   History of stomach ulcers    Hypercholesterolemia    Hypertension    Migraine    "used to have them; don't have them anymore" (12/08/2017)   Pneumonia    "probably twice" (12/08/2017)   Sleep apnea    "couldn't take the mask" (12/08/2017)   Stroke (HCC)    "I don't remember if I have had one or not; I believe I have" (12/08/2017)   Past Surgical History:  Procedure Laterality Date   ABDOMINAL HERNIA REPAIR     ABDOMINAL HYSTERECTOMY     APPENDECTOMY     BIOPSY  12/08/2017   Procedure: BIOPSY;  Surgeon: Lajuan Pila, MD;  Location: [facility] ENDOSCOPY;  Service: Endoscopy;;   BREAST SURGERY Left    "tumors removed; not cancer"   BUNIONECTOMY Bilateral    CARPAL TUNNEL RELEASE Bilateral    CATARACT EXTRACTION, BILATERAL Bilateral    CHOLECYSTECTOMY     COLONOSCOPY  03/03/2011   Mild sigmoid diverticulosis. Internal hemorrhoids.    DILATION AND CURETTAGE OF UTERUS     ELBOW FRACTURE SURGERY Left    ESOPHAGOGASTRODUODENOSCOPY N/A 12/08/2017   Procedure: ESOPHAGOGASTRODUODENOSCOPY (EGD);  Surgeon: Lajuan Pila, MD;  Location: [facility] ENDOSCOPY;   Service: Endoscopy;  Laterality: N/A;   ESOPHAGOGASTRODUODENOSCOPY ENDOSCOPY  12/08/2017   w/bx   FRACTURE SURGERY     HERNIA REPAIR     HIP FRACTURE SURGERY Left    IR CT HEAD LTD  09/20/2022   IR CT HEAD LTD  09/20/2022   IR CT HEAD LTD  07/08/2023   IR PERCUTANEOUS ART THROMBECTOMY/INFUSION INTRACRANIAL INC DIAG ANGIO  09/20/2022   IR PERCUTANEOUS ART THROMBECTOMY/INFUSION INTRACRANIAL INC DIAG ANGIO  07/08/2023   KNEE ARTHROSCOPY Right    RADIOLOGY WITH ANESTHESIA N/A 09/20/2022   Procedure: IR WITH ANESTHESIA;  Surgeon: Luellen Sages, MD;  Location: [facility] OR;  Service: Radiology;  Laterality: N/A;   RADIOLOGY WITH ANESTHESIA N/A 07/08/2023   Procedure: RADIOLOGY WITH ANESTHESIA;  Surgeon: Radiologist, Medication, MD;  Location: [facility] OR;  Service: Radiology;  Laterality: N/A;   SHOULDER OPEN ROTATOR CUFF REPAIR Bilateral    TONSILLECTOMY      Allergies  Allergies  Allergen Reactions   Nsaids Other (See Comments)    CAN TAKE ONLY TYLENOL - NOTHING ELSE!!!!!!   Codeine Nausea And Vomiting and Other (See Comments)    GI Intolerance   Lyrica [Pregabalin] Other (See Comments)    Reaction not known at this time   Penicillins Other (See Comments)    Childhood allergy- exact  reaction not noted    History of Present Illness    Melissa [name] has a PMH of atrial fibrillation, left atrial appendage thrombus, cognitive impairment, HTN, GI bleed, COPD, HLD and coronary artery disease.  She underwent LHC with PCI in March and was noted to have subocclusive stenosis of her left M1.  She underwent stenting.  She was admitted on 09/17/2023 and discharged on 09/18/2023.  She was diagnosed with shortness of breath and noted to be in atrial fibrillation with RVR.  She was noted to have left atrial appendage thrombus.  She underwent TTE which showed normal EF.  She was not noted to have PE on CT angio.  Her CTA showed new left atrial appendage thrombus.  She was previously considered for [facility] 2024 but  was ultimately placed on Eliquis ..  She was seen and evaluated by cardiology and she was not felt to be a candidate for TEE due to high risk for stroke with DCCV due to her Eliquis  nonadherence.  She was noted to be stable.  Home physical therapy was ordered.  She presents to the clinic today for follow-up evaluation and states***.  Permanent atrial fibrillation-EKG today shows***.  Reports compliance with apixaban .  Denies bleeding issues.  Noted to have left atrial appendage thrombus on recent admission.  CHA2DS2-VASc score 7 (CHF, HTN, CVA-2, age-48, gender) Continue apixaban  Avoid triggers caffeine, chocolate, EtOH, dehydration etc. Repeat CBC Follows with EP  Coronary artery disease-denies chest pain.  Underwent prior LHC with thrombectomy and stent placement to her MCA. Heart healthy low-sodium diet Continue rosuvastatin , amlodipine   Hyperlipidemia-LDL***. Continue rosuvastatin  High-fiber diet Increase physical activity as tolerated  Hypertension-BP today***. Maintain blood pressure log Continue amlodipine , losartan   Dementia-continues with progressive memory issues.  Today she*** Following with PCP  Disposition: Follow-up with Dr. Emmette Harms or me in 3-4 months.     Home Medications    Prior to Admission medications   Medication Sig Start Date End Date Taking? Authorizing Provider  albuterol  (VENTOLIN  HFA) 108 (90 Base) MCG/ACT inhaler Inhale 2 puffs into the lungs every 6 (six) hours as needed for wheezing or shortness of breath.    [provider]  amLODipine  (NORVASC ) 10 MG tablet Take 0.5 tablets (5 mg total) by mouth daily. 09/18/23   Cathey Clunes, MD  apixaban  (ELIQUIS ) 5 MG TABS tablet Take 1 tablet (5 mg total) by mouth 2 (two) times daily. 09/18/23   Cathey Clunes, MD  docusate sodium  (COLACE) 100 MG capsule Take 1 capsule (100 mg total) by mouth daily. 09/24/22   Lehner, Erin C, NP  feeding supplement (ENSURE ENLIVE / ENSURE PLUS) LIQD Take  237 mLs by mouth 2 (two) times daily between meals. 07/16/23   Imogene Mana, NP  hydrOXYzine  (ATARAX ) 10 MG tablet Take 1 tablet (10 mg total) by mouth 3 (three) times daily as needed for anxiety. 09/18/23   Cathey Clunes, MD  losartan  (COZAAR ) 50 MG tablet Take 50 mg by mouth daily. 06/24/23 07/24/23  [provider]  pantoprazole  (PROTONIX ) 40 MG tablet Take 1 tablet (40 mg total) by mouth daily. 09/18/23   Cathey Clunes, MD  rosuvastatin  (CRESTOR ) 40 MG tablet Take 1 tablet (40 mg total) by mouth daily. 09/18/23   Cathey Clunes, MD  sertraline  (ZOLOFT ) 25 MG tablet Take 25 mg by mouth daily. 06/15/23   [provider]    Family History    No family history on file. has no family status information on file.   Social History  Social History   Socioeconomic History   Marital status: Divorced    Spouse name: Not on file   Number of children: Not on file   Years of education: Not on file   Highest education level: Not on file  Occupational History   Not on file  Tobacco Use   Smoking status: Never   Smokeless tobacco: Former    Types: Chew   Tobacco comments:    12/08/2017 "stopped chewing a few years back"  Vaping Use   Vaping status: Never Used  Substance and Sexual Activity   Alcohol  use: Never   Drug use: Never   Sexual activity: Not Currently  Other Topics Concern   Not on file  Social History Narrative   Not on file   Social Drivers of Health   Financial Resource Strain: Medium Risk (06/22/2023)   [facility]   Overall Financial Resource Strain (CARDIA)    Difficulty of Paying Living Expenses: Somewhat hard  Food Insecurity: Food Insecurity Present (09/18/2023)   Hunger Vital Sign    Worried About Running Out of Food in the Last Year: Sometimes true    Ran Out of Food in the Last Year: Sometimes true  Transportation Needs: No Transportation Needs (09/18/2023)   PRAPARE - Scientist, research (physical sciences) (Medical): No    Lack of Transportation (Non-Medical): No  Recent Concern: Transportation Needs - Unmet Transportation Needs (06/22/2023)   Received [facility] - Transportation    Lack of Transportation (Medical): Yes    Lack of Transportation (Non-Medical): Yes  Physical Activity: Inactive (06/22/2023)   Received from [facility]   Exercise Vital Sign    Days of Exercise per Week: 0 days    Minutes of Exercise per Session: 0 min  Stress: Stress Concern Present (06/22/2023)   Received from [facility] of Occupational [facility] - Occupational Stress Questionnaire    Feeling of Stress : To some extent  Social Connections: Socially Isolated (09/18/2023)   Social Connection and Isolation Panel [NHANES]    Frequency of Communication with Friends and Family: Once a week    Frequency of Social Gatherings with Friends and Family: Once a week    Attends Religious Services: 1 to 4 times per year    Active Member of [employer] or Organizations: No    Attends Banker Meetings: Never    Marital Status: Widowed  Intimate Partner Violence: Not At Risk (09/18/2023)   Humiliation, Afraid, Rape, and Kick questionnaire    Fear of Current or Ex-Partner: No    Emotionally Abused: No    Physically Abused: No    Sexually Abused: No     Review of Systems    General:  No chills, fever, night sweats or weight changes.  Cardiovascular:  No chest pain, dyspnea on exertion, edema, orthopnea, palpitations, paroxysmal nocturnal dyspnea. Dermatological: No rash, lesions/masses Respiratory: No cough, dyspnea Urologic: No hematuria, dysuria Abdominal:   No nausea, vomiting, diarrhea, bright red blood per rectum, melena, or hematemesis Neurologic:  No visual changes, wkns, changes in mental status. All other systems reviewed and are otherwise negative except as noted above.  Physical Exam    VS:  There were no vitals taken for this visit. , BMI  There is no height or weight on file to calculate BMI. GEN: Well nourished, well developed, in no acute distress. HEENT: normal. Neck: Supple, no JVD, carotid bruits, or  masses. Cardiac: RRR, no murmurs, rubs, or gallops. No clubbing, cyanosis, edema.  Radials/DP/PT 2+ and equal bilaterally.  Respiratory:  Respirations regular and unlabored, clear to auscultation bilaterally. GI: Soft, nontender, nondistended, BS + x 4. MS: no deformity or atrophy. Skin: warm and dry, no rash. Neuro:  Strength and sensation are intact. Psych: Normal affect.  Accessory Clinical Findings    Recent Labs: 07/10/2023: Magnesium  1.7 09/17/2023: ALT 12; B Natriuretic Peptide 172.1 09/18/2023: BUN 8; Creatinine, Ser 0.86; Hemoglobin 8.1; Platelets 531; Potassium 3.9; Sodium 137   Recent Lipid Panel    Component Value Date/Time   CHOL 128 07/09/2023 0124   TRIG 59 07/09/2023 0124   HDL 48 07/09/2023 0124   CHOLHDL 2.7 07/09/2023 0124   VLDL 12 07/09/2023 0124   LDLCALC 68 07/09/2023 0124    No BP recorded.  {Refresh Note OR Click here to enter BP  :1}***    ECG personally reviewed by me today- ***     Echocardiogram 09/17/2023  IMPRESSIONS     1. Left ventricular ejection fraction, by estimation, is 60 to 65%. The  left ventricle has normal function. The left ventricle has no regional  wall motion abnormalities. Left ventricular diastolic function could not  be evaluated.   2. Right ventricular systolic function is normal. The right ventricular  size is normal. There is normal pulmonary artery systolic pressure.   3. Right atrial size was moderately dilated.   4. Left atrial size was mildly dilated.   5. The aortic valve is tricuspid. Aortic valve regurgitation is trivial.  Aortic valve sclerosis/calcification is present, without any evidence of  aortic stenosis. Aortic valve Vmax measures 1.53 m/s.   6. The mitral valve is normal in structure. Trivial mitral valve  regurgitation. No evidence  of mitral stenosis.   7. The inferior vena cava is dilated in size with >50% respiratory  variability, suggesting right atrial pressure of 8 mmHg.   8. Cannot visualize left atrial appendage on 2D echo. Consider TEE or  Chest CTA if there is a need to rule out LAA.   FINDINGS   Left Ventricle: Left ventricular ejection fraction, by estimation, is 60  to 65%. The left ventricle has normal function. The left ventricle has no  regional wall motion abnormalities. The left ventricular internal cavity  size was normal in size. There is   no left ventricular hypertrophy. Left ventricular diastolic function  could not be evaluated.   Right Ventricle: The right ventricular size is normal. No increase in  right ventricular wall thickness. Right ventricular systolic function is  normal. There is normal pulmonary artery systolic pressure. The tricuspid  regurgitant velocity is 1.77 m/s, and   with an assumed right atrial pressure of 8 mmHg, the estimated right  ventricular systolic pressure is 20.5 mmHg.   Left Atrium: Cannot visualize left atrial appendage on 2D echo. Consider  TEE or Chest CTA if there is a need to rule out LAA. Left atrial size was  mildly dilated.   Right Atrium: Right atrial size was moderately dilated.   Pericardium: There is no evidence of pericardial effusion.   Mitral Valve: The mitral valve is normal in structure. Trivial mitral  valve regurgitation. No evidence of mitral valve stenosis. MV peak  gradient, 6.9 mmHg. The mean mitral valve gradient is 2.0 mmHg.   Tricuspid Valve: The tricuspid valve is normal in structure. Tricuspid  valve regurgitation is trivial. No evidence of tricuspid stenosis.   Aortic Valve: The aortic valve  is tricuspid. Aortic valve regurgitation is  trivial. Aortic valve sclerosis/calcification is present, without any  evidence of aortic stenosis. Aortic valve peak gradient measures 9.4 mmHg.   Pulmonic Valve: The pulmonic valve was  normal in structure. Pulmonic valve  regurgitation is not visualized. No evidence of pulmonic stenosis.   Aorta: The aortic root is normal in size and structure.   Venous: The inferior vena cava is dilated in size with greater than 50%  respiratory variability, suggesting right atrial pressure of 8 mmHg.   IAS/Shunts: No atrial level shunt detected by color flow Doppler.       Assessment & Plan   1.  ***   Chet Cota. Chiyeko Ferre NP-C     10/09/2023, 9:07 AM [facility] [address] Office [phone] Fax [phone]    I spent***minutes examining this patient, reviewing medications, and using patient centered shared decision making involving their cardiac care.   I spent  20 minutes reviewing past medical history,  medications, and prior cardiac tests.

## 2023-10-12 ENCOUNTER — Ambulatory Visit: Attending: Internal Medicine | Admitting: General Practice

## 2023-10-13 ENCOUNTER — Inpatient Hospital Stay: Admitting: Neurology

## 2023-10-13 ENCOUNTER — Encounter: Payer: Self-pay | Admitting: Neurology

## 2023-10-14 ENCOUNTER — Ambulatory Visit: Admitting: Emergency Medicine

## 2023-10-14 ENCOUNTER — Emergency Department (HOSPITAL_COMMUNITY)
Admission: EM | Admit: 2023-10-14 | Discharge: 2023-10-14 | Disposition: A | Discharge status: Home / Self Care | Attending: Emergency Medicine | Admitting: Emergency Medicine

## 2023-10-14 ENCOUNTER — Emergency Department (HOSPITAL_COMMUNITY)

## 2023-10-14 ENCOUNTER — Encounter (HOSPITAL_COMMUNITY): Payer: Self-pay

## 2023-10-14 ENCOUNTER — Other Ambulatory Visit: Payer: Self-pay

## 2023-10-14 DIAGNOSIS — I2510 Atherosclerotic heart disease of native coronary artery without angina pectoris: Secondary | ICD-10-CM | POA: Diagnosis not present

## 2023-10-14 DIAGNOSIS — R079 Chest pain, unspecified: Secondary | ICD-10-CM | POA: Insufficient documentation

## 2023-10-14 DIAGNOSIS — R0602 Shortness of breath: Secondary | ICD-10-CM | POA: Diagnosis not present

## 2023-10-14 DIAGNOSIS — I509 Heart failure, unspecified: Secondary | ICD-10-CM | POA: Insufficient documentation

## 2023-10-14 DIAGNOSIS — N183 Chronic kidney disease, stage 3 unspecified: Secondary | ICD-10-CM | POA: Diagnosis not present

## 2023-10-14 DIAGNOSIS — Z7901 Long term (current) use of anticoagulants: Secondary | ICD-10-CM | POA: Insufficient documentation

## 2023-10-14 DIAGNOSIS — J449 Chronic obstructive pulmonary disease, unspecified: Secondary | ICD-10-CM | POA: Insufficient documentation

## 2023-10-14 LAB — TROPONIN I (HIGH SENSITIVITY)
Troponin I (High Sensitivity): 10 ng/L (ref <18)
Troponin I (High Sensitivity): 10 ng/L (ref <18)

## 2023-10-14 LAB — BASIC METABOLIC PANEL WITH GFR
Anion gap: 11 (ref 5–15)
BUN: 7 mg/dL — ABNORMAL LOW (ref 8–23)
CO2: 21 mmol/L — ABNORMAL LOW (ref 22–32)
Calcium: 8.4 mg/dL — ABNORMAL LOW (ref 8.9–10.3)
Chloride: 103 mmol/L (ref 98–111)
Creatinine, Ser: 0.85 mg/dL (ref 0.44–1.00)
GFR, Estimated: >60 mL/min (ref >60)
Glucose, Bld: 111 mg/dL — ABNORMAL HIGH (ref 70–99)
Potassium: 2.9 mmol/L — ABNORMAL LOW (ref 3.5–5.1)
Sodium: 135 mmol/L (ref 135–145)

## 2023-10-14 LAB — CBC
HCT: 27.9 % — ABNORMAL LOW (ref 36.0–46.0)
Hemoglobin: 8.2 g/dL — ABNORMAL LOW (ref 12.0–15.0)
MCH: 21.8 pg — ABNORMAL LOW (ref 26.0–34.0)
MCHC: 29.4 g/dL — ABNORMAL LOW (ref 30.0–36.0)
MCV: 74.2 fL — ABNORMAL LOW (ref 80.0–100.0)
Platelets: 427 10*3/uL — ABNORMAL HIGH (ref 150–400)
RBC: 3.76 MIL/uL — ABNORMAL LOW (ref 3.87–5.11)
RDW: 22.7 % — ABNORMAL HIGH (ref 11.5–15.5)
WBC: 7.8 10*3/uL (ref 4.0–10.5)
nRBC: 0.4 % — ABNORMAL HIGH (ref 0.0–0.2)

## 2023-10-14 LAB — PROTIME-INR
INR: 1 (ref 0.8–1.2)
Prothrombin Time: 13.8 s (ref 11.4–15.2)

## 2023-10-14 LAB — BRAIN NATRIURETIC PEPTIDE: B Natriuretic Peptide: 129.6 pg/mL — ABNORMAL HIGH (ref 0.0–100.0)

## 2023-10-14 MED ORDER — POTASSIUM CHLORIDE 20 MEQ PO PACK
60.0000 meq | PACK | Freq: Once | ORAL | Status: AC
  Administered 2023-10-14: 60 meq via ORAL
  Filled 2023-10-14: qty 3

## 2023-10-14 MED ORDER — POTASSIUM CHLORIDE CRYS ER 20 MEQ PO TBCR: 60.0000 meq | EXTENDED_RELEASE_TABLET | Freq: Once | ORAL | Status: DC

## 2023-10-14 MED ORDER — IPRATROPIUM-ALBUTEROL 0.5-2.5 (3) MG/3ML IN SOLN
3.0000 mL | Freq: Once | RESPIRATORY_TRACT | Status: AC
  Administered 2023-10-14: 3 mL via RESPIRATORY_TRACT
  Filled 2023-10-14: qty 3

## 2023-10-14 MED ORDER — POTASSIUM CHLORIDE CRYS ER 20 MEQ PO TBCR: 20.0000 meq | EXTENDED_RELEASE_TABLET | Freq: Two times a day (BID) | ORAL | 0 refills | Status: AC

## 2023-10-14 NOTE — ED Notes (Signed)
 ED Provider at bedside.

## 2023-10-14 NOTE — ED Notes (Addendum)
 Per SW Sheryle Donning, patient can be discharged home with cab. Upon discharge, this RN instructed patient that she must take a shower prior to getting a cab. Patient raised fist at this RN in attempt to punch. RN explained to patient that she can't get into cab with bed bugs if shower is not completed first. Patient refuses to take shower, security to escort patient to lobby. Patient handed discharge papers by RN and thrown into floor. Charge Futures trader at bedside. Security handed papers back to patient in hand. Patient states she can get friend to pick her up. In wheelchair to lobby. Patient endorses having a cane, no cane in room cleaned by RN or turned in. Patient endorses she will call her own ride home. Bus pass not provided by RN, patient to go home via friend since refusal for shower for cab.

## 2023-10-14 NOTE — ED Notes (Signed)
 Patient assisted to bedside commode.

## 2023-10-14 NOTE — ED Triage Notes (Signed)
 Patient BIB [facility] for CP X 2 hours, patient states pain is mid-sternal, does not radiate. Patient given 324 ASA en route.

## 2023-10-14 NOTE — ED Notes (Signed)
 Patient noted to have several small insect crawling on skin and clothing, suspicious for bed bugs, charge RN notified.

## 2023-10-14 NOTE — ED Provider Notes (Signed)
  EMERGENCY DEPARTMENT AT [facility] Provider Note   CSN: [id number] Arrival date & time: 10/14/23  0128     History  Chief Complaint  Patient presents with   Chest Pain    Melissa [name] is a 78 y.o. female with history of acute blood loss anemia, dementia, CHF, COPD, atrial fibrillation, CKD stage III, chronic bilateral low back pain, CAD, depression, GERD, acute ischemic left MCA stroke, thrombus of atrial appendage.  Patient presents to ED for evaluation of shortness of breath and chest pain.  States that this evening around 1130 or 12 at night she developed chest pain located centrally in her midsternal region that did not radiate.  She states that she was also short of breath at this time but goes on to state that she is always short of breath.  I asked the patient what was different about her shortness of breath at night that prompted her to call EMS and she reported that her son encouraged her to call EMS due to her shortness of breath.  She goes on to state that she also has centralized chest pain that does not radiate.  She states she always has chest pain.  She states that there are no new features to her chest pain tonight.  She was recently admitted to the hospital from 5/15 to 5/16.  Was admitted during this time due to forming a new thrombus despite anticoagulation.  During this admission, there was concern that the patient was not compliant on Eliquis .  The patient reports to me that she has been compliant on Eliquis  since discharge.  Of note, the patient was considered for a watchman her last cardiology visit in October 2024.   Chest Pain Associated symptoms: shortness of breath        Home Medications Prior to Admission medications   Medication Sig Start Date End Date Taking? Authorizing Provider  potassium chloride  SA (KLOR-CON  M) 20 MEQ tablet Take 1 tablet (20 mEq total) by mouth 2 (two) times daily. 10/14/23  Yes Adel Aden, PA-C   albuterol  (VENTOLIN  HFA) 108 (90 Base) MCG/ACT inhaler Inhale 2 puffs into the lungs every 6 (six) hours as needed for wheezing or shortness of breath.    [provider]  amLODipine  (NORVASC ) 10 MG tablet Take 0.5 tablets (5 mg total) by mouth daily. 09/18/23   Cathey Clunes, MD  apixaban  (ELIQUIS ) 5 MG TABS tablet Take 1 tablet (5 mg total) by mouth 2 (two) times daily. 09/18/23   Cathey Clunes, MD  docusate sodium  (COLACE) 100 MG capsule Take 1 capsule (100 mg total) by mouth daily. 09/24/22   Lehner, Erin C, NP  feeding supplement (ENSURE ENLIVE / ENSURE PLUS) LIQD Take 237 mLs by mouth 2 (two) times daily between meals. 07/16/23   Imogene Mana, NP  hydrOXYzine  (ATARAX ) 10 MG tablet Take 1 tablet (10 mg total) by mouth 3 (three) times daily as needed for anxiety. 09/18/23   Cathey Clunes, MD  losartan  (COZAAR ) 50 MG tablet Take 50 mg by mouth daily. 06/24/23 07/24/23  [provider]  pantoprazole  (PROTONIX ) 40 MG tablet Take 1 tablet (40 mg total) by mouth daily. 09/18/23   Cathey Clunes, MD  rosuvastatin  (CRESTOR ) 40 MG tablet Take 1 tablet (40 mg total) by mouth daily. 09/18/23   Cathey Clunes, MD  sertraline  (ZOLOFT ) 25 MG tablet Take 25 mg by mouth daily. 06/15/23   [provider]      Allergies    Nsaids,  Codeine, Lyrica [pregabalin], and Penicillins    Review of Systems   Review of Systems  Respiratory:  Positive for shortness of breath.   Cardiovascular:  Positive for chest pain.  All other systems reviewed and are negative.   Physical Exam Updated Vital Signs BP 139/74   Pulse 71   Temp 98.8 F (37.1 C) (Oral)   Resp 13   Ht 5' 6 (1.676 m)   Wt 65.8 kg   SpO2 99%   BMI 23.40 kg/m  Physical Exam Vitals and nursing note reviewed.  Constitutional:      General: She is not in acute distress.    Appearance: She is well-developed.  HENT:     Head: Normocephalic and atraumatic.  Eyes:      Conjunctiva/sclera: Conjunctivae normal.  Cardiovascular:     Rate and Rhythm: Normal rate and regular rhythm.     Heart sounds: No murmur heard. Pulmonary:     Effort: Pulmonary effort is normal. No respiratory distress.     Breath sounds: Normal breath sounds.  Abdominal:     Palpations: Abdomen is soft.     Tenderness: There is no abdominal tenderness.  Musculoskeletal:        General: No swelling.     Cervical back: Neck supple.     Right lower leg: No edema.     Left lower leg: No edema.  Skin:    General: Skin is warm and dry.     Capillary Refill: Capillary refill takes less than 2 seconds.  Neurological:     Mental Status: She is alert. Mental status is at baseline.  Psychiatric:        Mood and Affect: Mood normal.     ED Results / Procedures / Treatments   Labs (all labs ordered are listed, but only abnormal results are displayed) Labs Reviewed  BASIC METABOLIC PANEL WITH GFR - Abnormal; Notable for the following components:      Result Value   Potassium 2.9 (*)    CO2 21 (*)    Glucose, Bld 111 (*)    BUN 7 (*)    Calcium  8.4 (*)    All other components within normal limits  CBC - Abnormal; Notable for the following components:   RBC 3.76 (*)    Hemoglobin 8.2 (*)    HCT 27.9 (*)    MCV 74.2 (*)    MCH 21.8 (*)    MCHC 29.4 (*)    RDW 22.7 (*)    Platelets 427 (*)    nRBC 0.4 (*)    All other components within normal limits  BRAIN NATRIURETIC PEPTIDE - Abnormal; Notable for the following components:   B Natriuretic Peptide 129.6 (*)    All other components within normal limits  PROTIME-INR  TROPONIN I (HIGH SENSITIVITY)  TROPONIN I (HIGH SENSITIVITY)    EKG None  Radiology DG Chest Port 1 View Result Date: 10/14/2023 CLINICAL DATA:  Chest pain and shortness of breath EXAM: PORTABLE CHEST 1 VIEW COMPARISON:  10/08/2023 FINDINGS: Cardiac shadow is enlarged. Aortic calcifications are noted. Pulmonary vasculature is seen in the right apex. Lungs are  clear. No focal abnormality is noted. IMPRESSION: No acute abnormality noted. Electronically Signed   By: Violeta Grey M.D.   On: 10/14/2023 02:24    Procedures Procedures    Medications Ordered in ED Medications  ipratropium-albuterol  (DUONEB) 0.5-2.5 (3) MG/3ML nebulizer solution 3 mL (3 mLs Nebulization Given 10/14/23 0302)  potassium chloride  (KLOR-CON ) packet 60 mEq (  60 mEq Oral Given 10/14/23 0302)    ED Course/ Medical Decision Making/ A&P  Medical Decision Making Amount and/or Complexity of Data Reviewed Labs: ordered. Radiology: ordered.  Risk Prescription drug management.   78 year old female presents for evaluation.  Please see HPI for further details.  On examination the patient is afebrile and nontachycardic.  Her lung sounds are clear bilaterally, she is not hypoxic.  Abdomen soft and compressible.  Neurological examination is baseline.  No edema to bilateral lower extremities.  Vital signs reassuring.  No hypoxia.  Will collect CBC, BMP, troponin x 2, BNP, PT/INR, chest x-ray and EKG.  Patient given DuoNeb for shortness of breath.  Patient CBC with no leukocytosis, baseline hemoglobin 8.2.  Metabolic panel with potassium 2.9 repleted with 60 mEq oral potassium.  Patient will be discharged home with 20 mEq oral potassium which she will take twice a day for 3 days.  Patient troponin 10, delta 10.  BNP very mildly elevated at 129.6.  Chest x-ray reveals no signs of pleural effusion or consolidations.  PT/INR within normal limits.  EKG shows atrial fibrillation.  Patient has known history of atrial fibrillation.  At this time, patient workup is reassuring.  No abnormalities noted.  Patient will be discharged home at this time.  Advised the patient that she will need to follow-up with her cardiology team and PCP and she voiced understanding.  Patient reports she was unable to find her at home so we will arrange for patient ride back to her house.  Will send potassium to  pharmacy on file.  She was advised to return to the ED with any new or worsening symptoms and she voiced understanding.  Stable to discharge.   Final Clinical Impression(s) / ED Diagnoses Final diagnoses:  Chest pain, unspecified type  Shortness of breath    Rx / DC Orders ED Discharge Orders          Ordered    potassium chloride  SA (KLOR-CON  M) 20 MEQ tablet  2 times daily        10/14/23 0543              Adel Aden, PA-C 10/14/23 0544    Kelsey Patricia, MD 10/15/23 [phone]

## 2023-10-14 NOTE — ED Notes (Signed)
 Charge RN aware that patient is a D/C hold, charge RN to determine if patient should be moved to a different room since patient has suspected Bed Bugs.

## 2023-10-14 NOTE — ED Notes (Addendum)
 1230 10/14/23  Raul Cabot RN made RN aware that patient was given bus pass by another individual. This RN not aware of patient receiving bus pass or attempt to get on bus. Per patient, she was to call friend or family to come pick up as she couldn't get in cab due to bed bugs without decontamination.

## 2023-10-14 NOTE — Discharge Instructions (Signed)
 It was a pleasure taking part in your care.  As we discussed, the workup here is reassuring.  No evidence of stress on your heart.  Please follow-up with your PCP and cardiology for further management.  Please begin taking potassium 20 mEq by mouth twice daily for 3 days.  Return to the ED with any new or worsening symptoms.

## 2023-10-14 NOTE — ED Notes (Addendum)
 Post discharge note-1600 Called sister Leopoldo Rancher and she states that the patient is back at home with her son and safe.

## 2023-10-23 ENCOUNTER — Telehealth: Payer: Self-pay

## 2023-10-23 NOTE — Patient Outreach (Signed)
 First telephone outreach attempt to obtain mRS. No answer. Left message for returned call.  [facility]  Population Health Care Management Assistant  Direct Dial: [phone]  Fax: [phone] Website: [email or web address]

## 2023-10-26 ENCOUNTER — Telehealth: Payer: Self-pay

## 2023-10-26 NOTE — Patient Outreach (Signed)
 Telephone outreach to patient to obtain mRS was successfully completed. MRS= 3  Melissa [name] [facility] Health Care Management Assistant  Direct Dial: [phone]  Fax: [phone] Website: [email or web address]

## 2023-10-31 ENCOUNTER — Encounter (HOSPITAL_COMMUNITY): Payer: Self-pay | Admitting: Interventional Radiology
